# Patient Record
Sex: Female | Born: 1955
Health system: Southern US, Community
[De-identification: ages and names within clinical notes are randomized; demographics above are authoritative.]

## PROBLEM LIST (undated history)

## (undated) DIAGNOSIS — F329 Major depressive disorder, single episode, unspecified: Secondary | ICD-10-CM

## (undated) DIAGNOSIS — I10 Essential (primary) hypertension: Secondary | ICD-10-CM

## (undated) DIAGNOSIS — F32A Depression, unspecified: Secondary | ICD-10-CM

## (undated) DIAGNOSIS — E7800 Pure hypercholesterolemia, unspecified: Secondary | ICD-10-CM

## (undated) DIAGNOSIS — Z950 Presence of cardiac pacemaker: Secondary | ICD-10-CM

## (undated) DIAGNOSIS — I2699 Other pulmonary embolism without acute cor pulmonale: Secondary | ICD-10-CM

## (undated) DIAGNOSIS — G8929 Other chronic pain: Secondary | ICD-10-CM

## (undated) DIAGNOSIS — M1990 Unspecified osteoarthritis, unspecified site: Secondary | ICD-10-CM

## (undated) DIAGNOSIS — E039 Hypothyroidism, unspecified: Secondary | ICD-10-CM

## (undated) DIAGNOSIS — I509 Heart failure, unspecified: Secondary | ICD-10-CM

## (undated) DIAGNOSIS — I82409 Acute embolism and thrombosis of unspecified deep veins of unspecified lower extremity: Secondary | ICD-10-CM

## (undated) DIAGNOSIS — M545 Low back pain, unspecified: Secondary | ICD-10-CM

## (undated) DIAGNOSIS — I428 Other cardiomyopathies: Secondary | ICD-10-CM

## (undated) DIAGNOSIS — E0500 Thyrotoxicosis with diffuse goiter without thyrotoxic crisis or storm: Secondary | ICD-10-CM

## (undated) DIAGNOSIS — E785 Hyperlipidemia, unspecified: Secondary | ICD-10-CM

## (undated) DIAGNOSIS — I447 Left bundle-branch block, unspecified: Secondary | ICD-10-CM

## (undated) DIAGNOSIS — G43909 Migraine, unspecified, not intractable, without status migrainosus: Secondary | ICD-10-CM

## (undated) DIAGNOSIS — F419 Anxiety disorder, unspecified: Secondary | ICD-10-CM

## (undated) HISTORY — DX: Anxiety disorder, unspecified: F41.9

## (undated) HISTORY — DX: Unspecified osteoarthritis, unspecified site: M19.90

## (undated) HISTORY — DX: Hypothyroidism, unspecified: E03.9

## (undated) HISTORY — DX: Hyperlipidemia, unspecified: E78.5

## (undated) HISTORY — DX: Left bundle-branch block, unspecified: I44.7

## (undated) HISTORY — PX: CARDIAC CATHETERIZATION: SHX172

## (undated) HISTORY — DX: Heart failure, unspecified: I50.9

## (undated) HISTORY — PX: INSERT / REPLACE / REMOVE PACEMAKER: SUR710

## (undated) HISTORY — PX: ABDOMINAL HYSTERECTOMY: SHX81

## (undated) HISTORY — DX: Pure hypercholesterolemia, unspecified: E78.00

## (undated) HISTORY — DX: Depression, unspecified: F32.A

## (undated) HISTORY — DX: Other pulmonary embolism without acute cor pulmonale: I26.99

## (undated) HISTORY — DX: Major depressive disorder, single episode, unspecified: F32.9

## (undated) HISTORY — DX: Essential (primary) hypertension: I10

## (undated) HISTORY — DX: Other cardiomyopathies: I42.8

---
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
## 2000-03-01 ENCOUNTER — Ambulatory Visit (HOSPITAL_COMMUNITY): Admission: RE | Admit: 2000-03-01 | Discharge: 2000-03-01 | Payer: Self-pay | Admitting: Endocrinology

## 2000-03-01 ENCOUNTER — Encounter: Payer: Self-pay | Admitting: Endocrinology

## 2000-03-08 ENCOUNTER — Encounter: Payer: Self-pay | Admitting: *Deleted

## 2000-03-08 ENCOUNTER — Ambulatory Visit (HOSPITAL_COMMUNITY): Admission: RE | Admit: 2000-03-08 | Discharge: 2000-03-08 | Payer: Self-pay | Admitting: *Deleted

## 2004-04-12 ENCOUNTER — Ambulatory Visit (HOSPITAL_COMMUNITY): Admission: RE | Admit: 2004-04-12 | Discharge: 2004-04-12 | Payer: Self-pay | Admitting: Obstetrics & Gynecology

## 2005-06-15 ENCOUNTER — Ambulatory Visit (HOSPITAL_COMMUNITY): Admission: RE | Admit: 2005-06-15 | Discharge: 2005-06-15 | Payer: Self-pay | Admitting: Obstetrics & Gynecology

## 2006-04-25 DIAGNOSIS — I82409 Acute embolism and thrombosis of unspecified deep veins of unspecified lower extremity: Secondary | ICD-10-CM

## 2006-04-25 DIAGNOSIS — I2699 Other pulmonary embolism without acute cor pulmonale: Secondary | ICD-10-CM

## 2006-04-25 HISTORY — DX: Other pulmonary embolism without acute cor pulmonale: I26.99

## 2006-04-25 HISTORY — DX: Acute embolism and thrombosis of unspecified deep veins of unspecified lower extremity: I82.409

## 2006-07-06 ENCOUNTER — Ambulatory Visit (HOSPITAL_COMMUNITY): Admission: RE | Admit: 2006-07-06 | Discharge: 2006-07-06 | Payer: Self-pay | Admitting: Obstetrics & Gynecology

## 2006-07-27 ENCOUNTER — Encounter: Admission: RE | Admit: 2006-07-27 | Discharge: 2006-07-27 | Payer: Self-pay | Admitting: Obstetrics & Gynecology

## 2007-02-12 ENCOUNTER — Encounter: Payer: Self-pay | Admitting: Internal Medicine

## 2007-04-30 ENCOUNTER — Encounter: Payer: Self-pay | Admitting: Internal Medicine

## 2007-10-16 ENCOUNTER — Ambulatory Visit: Payer: Self-pay | Admitting: Internal Medicine

## 2007-10-16 DIAGNOSIS — I10 Essential (primary) hypertension: Secondary | ICD-10-CM | POA: Insufficient documentation

## 2007-10-16 DIAGNOSIS — R519 Headache, unspecified: Secondary | ICD-10-CM | POA: Insufficient documentation

## 2007-10-16 DIAGNOSIS — R51 Headache: Secondary | ICD-10-CM | POA: Insufficient documentation

## 2007-10-16 HISTORY — DX: Essential (primary) hypertension: I10

## 2007-10-29 ENCOUNTER — Encounter: Payer: Self-pay | Admitting: Internal Medicine

## 2007-11-12 ENCOUNTER — Ambulatory Visit: Payer: Self-pay | Admitting: Internal Medicine

## 2007-11-15 ENCOUNTER — Ambulatory Visit: Payer: Self-pay | Admitting: Internal Medicine

## 2007-11-15 ENCOUNTER — Inpatient Hospital Stay (HOSPITAL_COMMUNITY): Admission: EM | Admit: 2007-11-15 | Discharge: 2007-11-23 | Payer: Self-pay | Admitting: Emergency Medicine

## 2007-11-15 ENCOUNTER — Ambulatory Visit: Payer: Self-pay

## 2007-11-15 ENCOUNTER — Ambulatory Visit: Payer: Self-pay | Admitting: Cardiology

## 2007-11-15 ENCOUNTER — Ambulatory Visit: Payer: Self-pay | Admitting: Pulmonary Disease

## 2007-11-16 ENCOUNTER — Telehealth: Payer: Self-pay | Admitting: Internal Medicine

## 2007-11-20 ENCOUNTER — Encounter: Payer: Self-pay | Admitting: Internal Medicine

## 2007-11-21 ENCOUNTER — Encounter: Payer: Self-pay | Admitting: Internal Medicine

## 2007-11-27 ENCOUNTER — Ambulatory Visit: Payer: Self-pay | Admitting: Cardiology

## 2007-12-03 ENCOUNTER — Ambulatory Visit: Payer: Self-pay | Admitting: Cardiology

## 2007-12-03 LAB — CONVERTED CEMR LAB
BUN: 9 mg/dL (ref 6–23)
Chloride: 106 meq/L (ref 96–112)
Free T4: 1.9 ng/dL — ABNORMAL HIGH (ref 0.6–1.6)
GFR calc Af Amer: 113 mL/min
GFR calc non Af Amer: 93 mL/min
Glucose, Bld: 106 mg/dL — ABNORMAL HIGH (ref 70–99)
Sodium: 140 meq/L (ref 135–145)
TSH: 0.1 microintl units/mL — ABNORMAL LOW (ref 0.35–5.50)

## 2007-12-04 ENCOUNTER — Ambulatory Visit: Payer: Self-pay | Admitting: Cardiovascular Disease

## 2007-12-04 ENCOUNTER — Ambulatory Visit: Payer: Self-pay | Admitting: Cardiology

## 2007-12-10 ENCOUNTER — Ambulatory Visit: Payer: Self-pay | Admitting: Cardiology

## 2007-12-17 ENCOUNTER — Ambulatory Visit: Payer: Self-pay | Admitting: Cardiology

## 2007-12-17 ENCOUNTER — Ambulatory Visit: Payer: Self-pay | Admitting: Cardiovascular Disease

## 2007-12-26 ENCOUNTER — Ambulatory Visit: Payer: Self-pay | Admitting: Cardiology

## 2008-01-02 ENCOUNTER — Ambulatory Visit: Payer: Self-pay | Admitting: Cardiology

## 2008-01-02 LAB — CONVERTED CEMR LAB
Creatinine, Ser: 0.8 mg/dL (ref 0.4–1.2)
Potassium: 3.8 meq/L (ref 3.5–5.1)

## 2008-01-11 ENCOUNTER — Ambulatory Visit: Payer: Self-pay | Admitting: Cardiology

## 2008-01-11 ENCOUNTER — Ambulatory Visit: Payer: Self-pay | Admitting: Internal Medicine

## 2008-01-11 LAB — CONVERTED CEMR LAB
ALT: 25 units/L (ref 0–35)
Alkaline Phosphatase: 68 units/L (ref 39–117)
Bilirubin, Direct: 0.1 mg/dL (ref 0.0–0.3)
Cholesterol: 189 mg/dL (ref 0–200)
HDL: 62.9 mg/dL (ref >39.0)
Triglycerides: 77 mg/dL (ref 0–149)
VLDL: 15 mg/dL (ref 0–40)

## 2008-01-18 ENCOUNTER — Ambulatory Visit: Payer: Self-pay | Admitting: Cardiology

## 2008-01-18 ENCOUNTER — Ambulatory Visit: Payer: Self-pay | Admitting: Internal Medicine

## 2008-01-18 LAB — CONVERTED CEMR LAB
Alkaline Phosphatase: 67 units/L (ref 39–117)
BUN: 11 mg/dL (ref 6–23)
CO2: 28 meq/L (ref 19–32)
Cholesterol: 187 mg/dL (ref 0–200)
Glucose, Bld: 93 mg/dL (ref 70–99)
HDL: 66.1 mg/dL (ref >39.0)
Total CHOL/HDL Ratio: 2.8
Triglycerides: 89 mg/dL (ref 0–149)

## 2008-01-30 ENCOUNTER — Ambulatory Visit: Payer: Self-pay | Admitting: Cardiology

## 2008-01-30 LAB — CONVERTED CEMR LAB
CO2: 28 meq/L (ref 19–32)
Calcium: 9.3 mg/dL (ref 8.4–10.5)
Chloride: 102 meq/L (ref 96–112)
GFR calc Af Amer: 96 mL/min
GFR calc non Af Amer: 80 mL/min
Glucose, Bld: 92 mg/dL (ref 70–99)

## 2008-02-04 ENCOUNTER — Ambulatory Visit: Payer: Self-pay | Admitting: Cardiology

## 2008-02-25 ENCOUNTER — Ambulatory Visit: Payer: Self-pay | Admitting: Cardiology

## 2008-03-10 ENCOUNTER — Ambulatory Visit: Payer: Self-pay | Admitting: Cardiology

## 2008-03-31 ENCOUNTER — Ambulatory Visit: Payer: Self-pay | Admitting: Cardiology

## 2008-03-31 ENCOUNTER — Ambulatory Visit: Payer: Self-pay | Admitting: Internal Medicine

## 2008-03-31 LAB — CONVERTED CEMR LAB
ALT: 24 units/L (ref 0–35)
AST: 22 units/L (ref 0–37)
Alkaline Phosphatase: 81 units/L (ref 39–117)
BUN: 19 mg/dL (ref 6–23)
Bilirubin, Direct: 0.1 mg/dL (ref 0.0–0.3)
CO2: 29 meq/L (ref 19–32)
Calcium: 9.9 mg/dL (ref 8.4–10.5)
Cholesterol: 159 mg/dL (ref 0–200)
Creatinine, Ser: 0.9 mg/dL (ref 0.4–1.2)
LDL Cholesterol: 75 mg/dL (ref 0–99)
Sodium: 139 meq/L (ref 135–145)
Total CHOL/HDL Ratio: 2.1
Total Protein: 7.5 g/dL (ref 6.0–8.3)
VLDL: 9 mg/dL (ref 0–40)

## 2008-04-07 ENCOUNTER — Ambulatory Visit: Payer: Self-pay | Admitting: Cardiology

## 2008-04-28 ENCOUNTER — Ambulatory Visit: Payer: Self-pay

## 2008-04-28 ENCOUNTER — Ambulatory Visit: Payer: Self-pay | Admitting: Cardiology

## 2008-04-28 ENCOUNTER — Encounter: Payer: Self-pay | Admitting: Cardiology

## 2008-04-28 LAB — CONVERTED CEMR LAB
BUN: 24 mg/dL — ABNORMAL HIGH (ref 6–23)
Basophils Relative: 0.8 % (ref 0.0–3.0)
Chloride: 102 meq/L (ref 96–112)
Eosinophils Relative: 5.4 % — ABNORMAL HIGH (ref 0.0–5.0)
GFR calc Af Amer: 60 mL/min
GFR calc non Af Amer: 50 mL/min
Glucose, Bld: 106 mg/dL — ABNORMAL HIGH (ref 70–99)
HCT: 39.8 % (ref 36.0–46.0)
Hemoglobin: 13.5 g/dL (ref 12.0–15.0)
Lymphocytes Relative: 31.6 % (ref 12.0–46.0)
MCHC: 34 g/dL (ref 30.0–36.0)
Platelets: 369 10*3/uL (ref 150–400)
Potassium: 4.5 meq/L (ref 3.5–5.1)
RDW: 12.2 % (ref 11.5–14.6)
Sodium: 137 meq/L (ref 135–145)
WBC: 7 10*3/uL (ref 4.5–10.5)

## 2008-05-01 ENCOUNTER — Ambulatory Visit: Payer: Self-pay | Admitting: Cardiovascular Disease

## 2008-05-02 ENCOUNTER — Inpatient Hospital Stay (HOSPITAL_BASED_OUTPATIENT_CLINIC_OR_DEPARTMENT_OTHER): Admission: RE | Admit: 2008-05-02 | Discharge: 2008-05-02 | Payer: Self-pay | Admitting: Cardiology

## 2008-05-02 ENCOUNTER — Ambulatory Visit: Payer: Self-pay | Admitting: Cardiology

## 2008-05-13 ENCOUNTER — Ambulatory Visit: Payer: Self-pay | Admitting: Cardiology

## 2008-09-23 ENCOUNTER — Encounter: Payer: Self-pay | Admitting: *Deleted

## 2008-10-29 ENCOUNTER — Encounter: Payer: Self-pay | Admitting: *Deleted

## 2008-11-12 DIAGNOSIS — I447 Left bundle-branch block, unspecified: Secondary | ICD-10-CM | POA: Insufficient documentation

## 2008-11-12 DIAGNOSIS — I428 Other cardiomyopathies: Secondary | ICD-10-CM

## 2008-11-12 HISTORY — DX: Left bundle-branch block, unspecified: I44.7

## 2008-11-12 HISTORY — DX: Other cardiomyopathies: I42.8

## 2008-11-18 ENCOUNTER — Ambulatory Visit: Payer: Self-pay | Admitting: Cardiology

## 2008-11-18 DIAGNOSIS — E7800 Pure hypercholesterolemia, unspecified: Secondary | ICD-10-CM | POA: Insufficient documentation

## 2008-11-18 HISTORY — DX: Pure hypercholesterolemia, unspecified: E78.00

## 2008-11-25 ENCOUNTER — Ambulatory Visit: Payer: Self-pay

## 2008-11-25 ENCOUNTER — Encounter: Payer: Self-pay | Admitting: Cardiology

## 2008-12-03 ENCOUNTER — Encounter: Payer: Self-pay | Admitting: Cardiology

## 2009-02-02 ENCOUNTER — Telehealth: Payer: Self-pay | Admitting: Cardiology

## 2009-02-03 ENCOUNTER — Ambulatory Visit: Payer: Self-pay | Admitting: Cardiology

## 2009-02-16 ENCOUNTER — Ambulatory Visit: Payer: Self-pay | Admitting: Cardiology

## 2009-02-17 ENCOUNTER — Encounter (INDEPENDENT_AMBULATORY_CARE_PROVIDER_SITE_OTHER): Payer: Self-pay | Admitting: *Deleted

## 2009-02-18 ENCOUNTER — Ambulatory Visit: Payer: Self-pay | Admitting: Cardiology

## 2009-02-18 LAB — CONVERTED CEMR LAB
BUN: 9 mg/dL (ref 6–23)
CO2: 28 meq/L (ref 19–32)
GFR calc non Af Amer: 83.85 mL/min (ref >60)

## 2009-02-24 ENCOUNTER — Ambulatory Visit: Payer: Self-pay | Admitting: Cardiology

## 2009-02-27 LAB — CONVERTED CEMR LAB
ALT: 21 units/L (ref 0–35)
AST: 21 units/L (ref 0–37)
Albumin: 3.9 g/dL (ref 3.5–5.2)
Alkaline Phosphatase: 105 units/L (ref 39–117)
Bilirubin, Direct: 0 mg/dL (ref 0.0–0.3)
VLDL: 11 mg/dL (ref 0.0–40.0)

## 2009-04-25 LAB — HM COLONOSCOPY

## 2009-04-25 LAB — HM MAMMOGRAPHY

## 2009-07-13 ENCOUNTER — Telehealth: Payer: Self-pay | Admitting: Cardiology

## 2009-10-23 ENCOUNTER — Telehealth: Payer: Self-pay | Admitting: Cardiology

## 2009-10-28 ENCOUNTER — Telehealth (INDEPENDENT_AMBULATORY_CARE_PROVIDER_SITE_OTHER): Payer: Self-pay | Admitting: *Deleted

## 2009-11-09 ENCOUNTER — Ambulatory Visit: Payer: Self-pay | Admitting: Cardiology

## 2009-11-10 LAB — CONVERTED CEMR LAB
Chloride: 106 meq/L (ref 96–112)
Creatinine, Ser: 0.6 mg/dL (ref 0.4–1.2)
GFR calc non Af Amer: 128.56 mL/min (ref >60)
Glucose, Bld: 92 mg/dL (ref 70–99)
Potassium: 4.1 meq/L (ref 3.5–5.1)
Sodium: 140 meq/L (ref 135–145)

## 2009-12-07 ENCOUNTER — Ambulatory Visit: Payer: Self-pay | Admitting: Cardiology

## 2010-01-11 ENCOUNTER — Telehealth: Payer: Self-pay | Admitting: Cardiology

## 2010-01-19 ENCOUNTER — Ambulatory Visit: Payer: Self-pay | Admitting: Cardiology

## 2010-01-19 DIAGNOSIS — E785 Hyperlipidemia, unspecified: Secondary | ICD-10-CM | POA: Insufficient documentation

## 2010-01-19 HISTORY — DX: Hyperlipidemia, unspecified: E78.5

## 2010-01-28 ENCOUNTER — Ambulatory Visit (HOSPITAL_COMMUNITY): Admission: RE | Admit: 2010-01-28 | Discharge: 2010-01-28 | Payer: Self-pay | Admitting: Obstetrics & Gynecology

## 2010-02-02 LAB — CONVERTED CEMR LAB
ALT: 18 units/L (ref 0–35)
Albumin: 4.2 g/dL (ref 3.5–5.2)
Alkaline Phosphatase: 89 units/L (ref 39–117)
Cholesterol: 244 mg/dL — ABNORMAL HIGH (ref 0–200)
Direct LDL: 162.2 mg/dL
Total CHOL/HDL Ratio: 4
Total Protein: 7.7 g/dL (ref 6.0–8.3)

## 2010-04-01 ENCOUNTER — Ambulatory Visit: Payer: Self-pay

## 2010-05-16 ENCOUNTER — Encounter: Payer: Self-pay | Admitting: Obstetrics & Gynecology

## 2010-05-23 LAB — CONVERTED CEMR LAB
ALT: 23 units/L (ref 0–35)
Albumin: 3.9 g/dL (ref 3.5–5.2)
Alkaline Phosphatase: 117 units/L (ref 39–117)
BUN: 10 mg/dL (ref 6–23)
Chloride: 100 meq/L (ref 96–112)
Cholesterol: 188 mg/dL (ref 0–200)
GFR calc non Af Amer: 96.07 mL/min (ref >60)
Glucose, Bld: 89 mg/dL (ref 70–99)
HDL: 62.7 mg/dL (ref >39.00)
Sodium: 139 meq/L (ref 135–145)
Total CHOL/HDL Ratio: 3
Triglycerides: 68 mg/dL (ref 0.0–149.0)
VLDL: 13.6 mg/dL (ref 0.0–40.0)

## 2010-05-27 NOTE — Progress Notes (Signed)
Summary: Calling regarding Benicar   Phone Note From Pharmacy Call back at [phone]   Caller: [facility]  [address] [phone]* Summary of Call: Calling regarding Benicar Initial call taken by: Judie Grieve,  October 23, 2009 1:01 PM  Follow-up for Phone Call        Spoke with pharmacy. Pharmacist states the issue was  resolved. no additional assistance was needed. Follow-up by: Ollen Gross, RN, BSN,  October 23, 2009 1:52 PM

## 2010-05-27 NOTE — Progress Notes (Signed)
  Medications Added LOSARTAN POTASSIUM 50 MG TABS (LOSARTAN POTASSIUM) take one tablet once daily       Phone Note Call from Patient   Caller: Patient Summary of Call: Pt returned called regarding request from pharmacy for medication change of Benicar to Losartan.  Dr. Shirlee Latch allowed switch from Benicar to Losartan Potassium 50 mg once daily with BMET in 2 weeks.  When pt returned call she agreed to this and also wants follow up appt to see Dr. Shirlee Latch as she wasnt able to keep last appt.  Will forward to Migdalia Dk to check availability for scheduling lab and follow up.  Medication updated in Med List and sent to pharmacy.  Judithe Modest, CMA/AAMA Initial call taken by: Judithe Modest CMA,  October 28, 2009 11:31 AM  Follow-up for Phone Call        pt is sch for lab work on 7/18 and follow up with Dr Shirlee Latch on 8/15, Migdalia Dk  October 28, 2009 4:23 PM   Additional Follow-up for Phone Call Additional follow up Details #1::        Called and [facility] for patient per her request of appt dates and times.  Asked her to call us back if these times are not convenient.   Additional Follow-up by: Judithe Modest CMA,  October 29, 2009 1:40 PM    New/Updated Medications: LOSARTAN POTASSIUM 50 MG TABS (LOSARTAN POTASSIUM) take one tablet once daily Prescriptions: LOSARTAN POTASSIUM 50 MG TABS (LOSARTAN POTASSIUM) take one tablet once daily  #30 x 3   Entered by:   Judithe Modest CMA   Authorized by:   Marca Ancona, MD   Signed by:   Judithe Modest CMA on 10/28/2009   Method used:   [facility]        [facility]  [address] [phone]* (retail)       [address]       [place], [place]  [address]       Ph: [phone] or [phone]       Fax: [phone]   RxID:   [id number]

## 2010-05-27 NOTE — Progress Notes (Signed)
Summary: pt wants to do labwork   Phone Note Call from Patient Call back at Home Phone [phone]   Caller: Patient Reason for Call: Talk to Nurse, Talk to Doctor Summary of Call: pt thinks it is time for her to have labwork done  Initial call taken by: Omer Jack,  January 11, 2010 1:09 PM  Follow-up for Phone Call        reschedule lipid and LFT Claris Gladden, RN, BSN

## 2010-05-27 NOTE — Progress Notes (Signed)
Summary: patient would like to discuss refill issues with RN  Medications Added SPIRONOLACTONE 25 MG TABS (SPIRONOLACTONE) 1 daily POTASSIUM CHLORIDE CR 10 MEQ CR-CAPS (POTASSIUM CHLORIDE) Take one tablet by mouth daily       Phone Note Call from Patient Call back at Home Phone [phone] Call back at [phone]   Caller: Patient Reason for Call: Talk to Nurse Summary of Call: Patient would like to discuss refill issues with RN Initial call taken by: Burnard Leigh,  July 13, 2009 10:38 AM  Follow-up for Phone Call        talked with pt--she is  requesting refills --I have done them to [facility] for her tonight    New/Updated Medications: SPIRONOLACTONE 25 MG TABS (SPIRONOLACTONE) 1 daily POTASSIUM CHLORIDE CR 10 MEQ CR-CAPS (POTASSIUM CHLORIDE) Take one tablet by mouth daily Prescriptions: SPIRONOLACTONE 25 MG TABS (SPIRONOLACTONE) 1 daily  #30 x 6   Entered by:   Katina Dung, RN, BSN   Authorized by:   Marca Ancona, MD   Signed by:   Katina Dung, RN, BSN on 07/13/2009   Method used:   Electronically to        [facility]. [phone]* (retail)       [address] [place], [place]  [address]       Ph: [phone]       Fax: [phone]   RxID:   [id number] POTASSIUM CHLORIDE CR 10 MEQ CR-CAPS (POTASSIUM CHLORIDE) Take one tablet by mouth daily  #30 x 6   Entered by:   Katina Dung, RN, BSN   Authorized by:   Marca Ancona, MD   Signed by:   Katina Dung, RN, BSN on 07/13/2009   Method used:   Electronically to        [facility]. [phone]* (retail)       [address] [place], [place]  [address]       Ph: [phone]       Fax: [phone]   RxID:   [id number] BENICAR 20 MG TABS (OLMESARTAN MEDOXOMIL) 1/2 tab am  #15 x 6   Entered by:   Katina Dung, RN, BSN   Authorized by:   Marca Ancona, MD   Signed by:   Katina Dung, RN, BSN on 07/13/2009   Method used:   Electronically to        [facility]. [phone]* (retail)       [address] [place], [place]  [address]       Ph: [phone]       Fax: [phone]   RxID:   [id number]

## 2010-05-27 NOTE — Assessment & Plan Note (Signed)
Summary: follow up after labs/mt  Medications Added COREG 6.25 MG TABS (CARVEDILOL) one twice a day SPIRONOLACTONE 25 MG TABS (SPIRONOLACTONE) 1 daily- prn FUROSEMIDE 20 MG TABS (FUROSEMIDE) 1 daily-prn POTASSIUM CHLORIDE CR 10 MEQ CR-CAPS (POTASSIUM CHLORIDE) Take one tablet by mouth daily-prn ARMOUR THYROID 90 MG TABS (THYROID) 1 tab once daily except sat/sun      Allergies Added:   Visit Type:  Follow-up Primary Provider:  Elvina Sidle, MD   History of Present Illness: 55 yo with history of hypothyroidism, nonischemic cardiomyopathy, and PE presents for followup.  Echo in 8/10 showed EF 50-55% with mild to moderate MR.  Patient has started back on Armour thyroid and feels like this is keeping her hypothyroidism under better control.  She has been doing well with no exertional dyspnea or chest pain.  She has lost 7 lbs since last appointment.  She has stopped taking spironolactone and Lasix and is just taking Coreg once a day.  Her first grand-child was just born.   ECG: NSR, LBBB  Labs (10/10): BNP 13, K 3.7, creatinine 0.8 Labs (7/11): K 4.1, creatinine 0.6   Current Medications (verified): 1)  Omeprazole 20 Mg  Tbec (Omeprazole) .... Take One Capsule Once Daily 2)  Clonazepam 0.5 Mg  Tabs (Clonazepam) .... As Needed 3)  Losartan Potassium 50 Mg Tabs (Losartan Potassium) .... Take One Tablet Once Daily 4)  Carvedilol 25 Mg Tabs (Carvedilol) .... 1/2 Tab  As Directed Per Pt 5)  Aspirin 81 Mg  Tabs (Aspirin) .Marland Kitchen.. 1 Tab By Mouth Once Daily 6)  Spironolactone 25 Mg Tabs (Spironolactone) .Marland Kitchen.. 1 Daily- Prn 7)  Furosemide 20 Mg Tabs (Furosemide) .Marland Kitchen.. 1 Daily 8)  Potassium Chloride Cr 10 Meq Cr-Caps (Potassium Chloride) .... Take One Tablet By Mouth Daily-Prn 9)  Armour Thyroid 90 Mg Tabs (Thyroid) .Marland Kitchen.. 1 Tab Once Daily Except Sat/sun  Allergies (verified): 1)  ! Lisinopril  Past History:  Past Medical History: 1. Hypothyroidism and the patient has history of Graves disease.   She is status post radioactive iodine therapy.  She then developed hypothyroidism, and the hypothyroidism has been quite difficult to control.   2. Cardiomyopathy, nonischemic.  The patient had a cardiac MRI done during her hospitalization in July 2009.  This showed moderate LV enlargement and global LV hypokinesis.  There was a septal paradpattern.  EF was calculated to be 27%.  There was mild left atrial enlargement.  The RV was normal in size and function.  There was no ASD or VSD.  There was no evidence for delayed enhancement pattern  in the LV myocardium suggesting that there was no scar from coronary artery disease and no definite evidence for infiltrative cardiomyopathy.  HIV was negative.  She did have an echo done also in the hospital in July 2009 that showed severe mitral regurgitation.  A TEE done following this showed an EF of 25-30% with severe mitral regurgitation.  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  The patient did have a left and right heart catheterization after treatment for congestive heart failure in January 2010.  There was no angiographic coronary disease.  EF had improved to 55-60% with no significant mitral  regurgitation.  The hemodynamics were totally normal on the rightheart catheterization.  The cardiomyopathy, I think, was likely du eto poorly controlled hypothyroidism.  Echo (8/10): EF 50-55%, mild LV dilatation, mild-mod MR, PASP 32 3. PE/DVT.  July 2009.  The patient had been very sedentary prior to  this due to her severe hypothyroidism.  She had  normal antithrombin III.  She was negative for factor V Leiden.  She had normal protein C and protein S function.  Now off coumadin.  4. Hypertension. 5. History of left bundle-branch block. 6. Asthma. 7. Migraines. 8. ACEI cough  Family History: Reviewed history from 11/18/2008 and no changes required. Mother-living; heart attack, HTN, Rheumatism Father-deceased age 41; prostate  cancer Brother-living, parathyroid problem Sister- living; arthiritis Brother- living; premature heart attack x 2; DM-insulin Niece- blood clot in groin  Social History: Reviewed history from 11/18/2008 and no changes required. 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   Vital Signs:  Patient profile:   55 year old female Height:      60 inches Weight:      171 pounds BMI:     33.52 Pulse rate:   84 / minute BP sitting:   120 / 80  (left arm) Cuff size:   large  Vitals Entered By: Burnett Kanaris, CNA (December 07, 2009 4:56 PM)  Physical Exam  General:  Well developed, well nourished, in no acute distress.  Neck:  Neck supple, no JVD. No masses, thyromegaly or abnormal cervical nodes. Lungs:  Clear bilaterally to auscultation and percussion. Heart:  Non-displaced PMI, chest non-tender; regular rate and rhythm, S1, S2 without murmurs, rubs or gallops. Paradoxical splitting of S2.  Carotid upstroke normal, no bruit. Pedals normal pulses. Trace edema. Abdomen:  Bowel sounds positive; abdomen soft and non-tender without masses, organomegaly, or hernias noted. No hepatosplenomegaly. Extremities:  No clubbing or cyanosis. Neurologic:  Alert and oriented x 3. Psych:  Normal affect.   Impression & Recommendations:  Problem # 1:  CARDIOMYOPATHY (ICD-425.4) EF improved to 50-55%.  Cardiomyopathy was nonischemic and may have been due to poorly-controlled hypothyroidism.  Ok to stop spironolactone and Lasix.  Would continue Coreg 6.25 mg two times a day and losartan 50 mg daily.  There really is not good data about whether or not she would be at risk for worsening of LV function if she were to stop ACEI and beta blockade at this point so I would lean towards continuing them as long as she is not having intolerable side effects.   Problem # 2:  PURE HYPERCHOLESTEROLEMIA  (ICD-272.0) Patient is no longer taking her statin.  She does have a strong family history of CAD.  I will check lipids/LFTs.   Problem # 3:  BUNDLE BRANCH BLOCK, LEFT (ICD-426.3) Chronic, likely due to prior cardiomyopathy.   Patient Instructions: 1)  Your physician has recommended you make the following change in your medication:  2)  Take Coreg(carvedilol) 6.25mg  twice a day. 3)  Fasting lab Monday August 24,2011. 4)  Your physician wants you to follow-up in: 1 year with Dr Shirlee Latch.  You will receive a reminder letter in the mail two months in advance. If you don't receive a letter, please call our office to schedule the follow-up appointment. Prescriptions: LOSARTAN POTASSIUM 50 MG TABS (LOSARTAN POTASSIUM) take one tablet once daily  #90 x 3   Entered by:   Katina Dung, RN, BSN   Authorized by:   Marca Ancona, MD   Signed by:   Katina Dung, RN, BSN on 12/07/2009   Method used:   Faxed to ...       [facility] (mail-order)             ,  [place]         Ph: [phone]       Fax: [phone]   RxID:   [id number] COREG 6.25 MG TABS (CARVEDILOL) one twice a day  #180 x 3   Entered by:   Katina Dung, RN, BSN   Authorized by:   Marca Ancona, MD   Signed by:   Katina Dung, RN, BSN on 12/07/2009   Method used:   Faxed to ...       [facility] (mail-order)             , [place]         Ph: [phone]       Fax: [phone]   RxID:   [id number]

## 2010-08-09 LAB — POCT I-STAT 3, VENOUS BLOOD GAS (G3P V)
Acid-Base Excess: 1 mmol/L (ref 0.0–2.0)
Acid-base deficit: 1 mmol/L (ref 0.0–2.0)
Bicarbonate: 23.9 mEq/L (ref 20.0–24.0)
Bicarbonate: 25 mEq/L — ABNORMAL HIGH (ref 20.0–24.0)
O2 Saturation: 70 %
O2 Saturation: 72 %
TCO2: 26 mmol/L (ref 0–100)
pCO2, Ven: 37.5 mmHg — ABNORMAL LOW (ref 45.0–50.0)
pH, Ven: 7.402 — ABNORMAL HIGH (ref 7.250–7.300)
pH, Ven: 7.408 — ABNORMAL HIGH (ref 7.250–7.300)
pH, Ven: 7.432 — ABNORMAL HIGH (ref 7.250–7.300)
pO2, Ven: 36 mmHg (ref 30.0–45.0)

## 2010-08-09 LAB — POCT I-STAT 3, ART BLOOD GAS (G3+)
Acid-base deficit: 1 mmol/L (ref 0.0–2.0)
O2 Saturation: 98 %
TCO2: 23 mmol/L (ref 0–100)

## 2010-09-07 NOTE — Assessment & Plan Note (Signed)
[facility]                            CARDIOLOGY OFFICE NOTE   CALIAH, KOPKE                          MRN:          [id number]  DATE:01/18/2008                            DOB:          04/07/1956    PRIMARY CARE PHYSICIAN:  Elvina Sidle, MD at [facility]   She was followed by Endocrinology, by Dr. Reather Littler.   HISTORY OF PRESENT ILLNESS:  This is a 55 year old with history of  PE/DVT, cardiomyopathy of uncertain etiology, and hypothyroidism who  presents to Cardiology Clinic for reassessment of her congestive heart  failure.  Since the last visit, the patient has actually been doing  quite well.  We have been titrating up her cardiac meds.  Her BNP has  fallen from 1115 to 26 when last checked.  She states that she does not  have orthopnea anymore.  She has not had PND.  She is able to walk up  steps now without being short of breath.  She can walk as far as she  wants on flat ground and she has not had any episodes of chest pain.  She has been seeing Dr. Lucianne Muss for adjustment of her thyroid medication.   PAST MEDICAL HISTORY:  1. Hypothyroidism.  The patient has a history of Graves disease.  She      is status post radioactive iodine therapy.  She then developed      hypothyroidism and the hypothyroidism has been quite difficult to      control.  She is now following up with Dr. Reather Littler and has been      begun back on Levoxyl, which she is taking 100 mcg daily.  2. Cardiomyopathy.  The patient had a cardiac MRI done in the      hospital, this showed moderate LV enlargement and global LV      hypokinesis.  There was septal paradox pattern.  EF was calculated      to be 27%.  There was mild left atrial enlargement.  The RV was      normal in size and function.  There was no ASD or VSD.  There was      no evidence for a delayed enhancement pattern in the LV myocardium      suggesting that there is no scar from coronary artery  disease and      no definite evidence for infiltrative cardiomyopathy.  HIV test was      negative in the hospital.  BNP was 1115 in the hospital has now      decreased to 26.  She did have an echocardiogram as well, which      noted severe mitral regurgitation and TE following this showing an      EF of 25-30% with severe mitral regurgitation that was likely due      to mitral annular dilatation.  3. 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  The patient had been      sedentary due to her hypothyroidism.  She did have normal      antithrombin  III and she was negative for factor V Leiden.  She had      normal protein C and protein S function.  4. Hypertension.  5. History of left bundle-branch block.  6. History of asthma.  7. History of migraines.  8. Cough with ACE inhibitor.  9. 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   CURRENT MEDICATIONS:  1. She is on Coreg 12.5 mg b.i.d.  2. Levoxyl 100 mcg daily.  3. Lasix 40 mg daily.  4. K-Dur 20 mEq daily.  5. Imdur 30 mg daily.  6. Zocor 20 mg daily.  7. Spironolactone 25 mg daily.  8. Coumadin as directed.  9. Olmesartan 10 mg daily.  10.Advair 500/50.  11.Hydralazine 25 mg t.i.d.   ALLERGIES:  She does get a cough with ACE INHIBITOR.   SOCIAL HISTORY:  The patient denies smoking.  She used to many years ago  when she was teenager.  Now, she does not drink alcohol and she was not  a heavy drinker in the past.  She has not used any recreational drugs  for the last 20 years.  She lives with her husband, has 2 adult  children.  She works as a Teacher, adult education.   PHYSICAL EXAMINATION:  VITAL SIGNS:  Blood pressure is 108/73, heart  rate is 82 and regular, and weight is 152 pounds.  This is stable since  last appointment when she was 151 pounds.  GENERAL:  No apparent distress.  She is a well-developed female.  NEUROLOGICAL:  Alert and oriented x3.  Normal affect.  NECK:  No JVD.   Thyroid does not appear significantly enlarged.  CARDIOVASCULAR:  Regular S1 and S2.  No S3 and no S4.  There is a 1/6  murmur heard towards the apex, it is softer than it has been in the  past.  There is no peripheral edema.  There are no carotid bruits.  ABDOMEN:  Soft and nontender.  No hepatosplenomegaly.  EXTREMITIES:  No clubbing or cyanosis.   LABORATORY DATA:  Most recent laboratory data from September 2009, BNP  is 26, LDL is 111, HDL 63, potassium 3.8, and creatinine 0.8.   ASSESSMENT AND PLAN:  This is 55 year old with a history of  cardiomyopathy of uncertain etiology, hypothyroidism, pulmonary  embolus/deep venous thrombosis who presents to Cardiology Clinic for  followup.  1. Cardiomyopathy.  The patient's cardiomyopathy is of uncertain      etiology.  She did have a cardiac MRI while in the hospital that      showed no scar at all, suggesting that she probably does not have      coronary artery disease.  If she had coronary artery disease that      was severe enough to cause her degree of cardiomyopathy.  She would      be expected to show some subendocardial pattern scar.      Additionally, there was no evidence on MRI for an infiltrative      disease either, HIV was negative.  She does not have any recent      history of substance abuse or heavy alcohol intake.  She does seem      to have a family history of early coronary artery disease in her      brother.  For further workup of her cardiomyopathy given the early      coronary artery disease in her brother, I think  she probably would      need a left heart catheterization; however, at this time she is on      full anticoagulation with warfarin for deep venous thrombus and PE      in July 2009.  I think after she finishes 6 months anticoagulation      in January, I will hold the warfarin for a left heart      catheterization to assess for any coronary artery disease.  I will      then restart the warfarin after cath  is complete given the      idiopathic pulmonary embolus and dilated cardiomyopathy.  Today,      the patient does appear euvolemic on exam.  This is considerably      improved from prior to discharge.  For her cardiac medications, we      will increase her Coreg today to 18.25 mg twice a day.  We will      continue her on her olmesartan, her hydralazine, her Imdur and her      spironolactone 25 mg daily.  She is on K-Dur also at 20 mEq daily.      She is euvolemic on exam.  We will continue her on the current dose      of Lasix.  Her BNP is actually back within normal range.      Additionally, in January, also we will go ahead and obtain an      echocardiogram to see if she has made some improvement in her      cardiac function, which I do suspect as her MR murmur is much      quieter and her BNP has normalized.  2. Hypothyroidism.  The patient is being treated with Levoxyl and      followed by Dr. Lucianne Muss.  Her heart rate is now no longer      tachycardiac.  3. Hypercholesterolemia.  We are going to increase her Zocor to 40 mg      daily to keep her LDL less than 100 since we do not know if she has      coronary artery disease or not.  4. We will plan on having the patient back for visit in 3 months to      see how she is doing.  We will check her lipids then.  We will also      check a chemistry today now that she is on a full dose of      spironolactone and in January, we will obtain a repeat      echocardiogram and a left heart catheterization.  If her      echocardiogram shows an ejection fraction greater than 35, at this      point she may be able to avoid having an ICD.     Marca Ancona, MD  Electronically Signed    DM/MedQ  DD: 01/18/2008  DT: 01/18/2008  Job #: [id number]   cc:   Pricilla Riffle, MD, [facility]  Elvina Sidle, M.D.  Reather Littler, M.D.

## 2010-09-07 NOTE — Assessment & Plan Note (Signed)
[facility]                            CARDIOLOGY OFFICE NOTE   PANHIA, KARL                          MRN:          [id number]  DATE:05/13/2008                            DOB:          08-18-1955    PRIMARY CARE PHYSICIAN:  Elvina Sidle, M.D. at [facility].   ENDOCRINOLOGIST:  Reather Littler, M.D.   HISTORY OF PRESENT ILLNESS:  This is a 55 year old with history of  PE/DVT, nonischemic cardiomyopathy and hypothyroidism who presents to  Cardiology Clinic for reassessment of her congestive heart failure.  The  patient lately has been doing quite well.  Her thyroid is now under  control.  She did have a right and left heart catheterization in January  2010, showing no angiographic coronary disease.  EF has improved to 55-  60%.  Right and left heart filling pressures were essentially normal.  Cardiac index was normal.  She, as mentioned, has been doing quite well.  She is back to exercising in the gym several times a week.  She is not  having any dyspnea on exertion any longer.  She was having some  lightheadedness with standing and her blood pressure was running low;  however, we cut back on her cardiac medications after the recent heart  catheterization and she tells me now that her lightheadedness has  resolved.  She has been followed closely for hypothyroidism by Dr.  Lucianne Muss.   PAST MEDICAL HISTORY:  1. Hypothyroidism and the patient has history of Graves disease.  She      is status post radioactive iodine therapy.  She then developed      hypothyroidism, and the hypothyroidism has been quite difficult to      control.  She is now followed by Dr. Reather Littler and has been put      back on Levoxyl as well as liothyronine.  2. Cardiomyopathy, nonischemic.  The patient had a cardiac MRI done      during her hospitalization in July 2009.  This showed moderate LV      enlargement and global LV hypokinesis.  There was a septal paradox  pattern.  EF was calculated to be 27%.  There was mild left atrial      enlargement.  The RV was normal in size and function.  There was no      ASD or VSD.  There was no evidence for delayed enhancement pattern      in the LV myocardium suggesting that there was no scar for coronary      artery disease and no definite evidence for infiltrative      cardiomyopathy.  HIV was negative.  She did have an echo done also      in the hospital in July 2009 that showed severe mitral      regurgitation.  A TEE done following this showed an EF of 25-30%      with severe mitral regurgitation.  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  The patient did have a left  and      right heart catheterization after treatment for congestive heart      failure in January 2010.  There was no angiographic coronary      disease.  EF had improved to 55-60% with no significant mitral      regurgitation.  The hemodynamics were totally normal on the right      heart catheterization.  The cardiomyopathy, I think, was likely due      to poorly controlled hypothyroidism.  3. 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  The patient had been very      sedentary prior to this due to her severe hypothyroidism.  She had      normal antithrombin III.  She was negative for factor V Leiden.      She had normal protein C and protein S function.  She was on      Coumadin for 6 months, and this was stopped prior to the heart      catheterization earlier this month.  4. Hypertension.  5. History of left bundle-branch block.  6. Asthma.  7. Migraines.  8. Cough with ACE inhibitor.  9. 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  This had resolved on her left ventriculogram on      the recent heart catheterization that was done.   CURRENT MEDICATIONS:  1. Olmesartan 10 mg daily.  2. Omeprazole 20 mg daily.  3. Spironolactone 25 mg daily.  4. Coreg 12.5 mg b.i.d.  5.  Zocor 40 mg daily.  6. Levoxyl 125 mcg daily.  7. Liothyronine 5 mcg daily.   ALLERGIES:  The patient gets cough with an ACE INHIBITOR.   SOCIAL HISTORY:  The patient does not smoke.  She used to smoke many  years ago when she was a teenager.  She does not smoke now.  She does  not drink alcohol.  She was not a heavy drinker in the past.  She has  not used any recreational drugs for the last 20 years.  She lives with  her husband, has 2 adult children.  She works as a Teacher, adult education.   PHYSICAL EXAMINATION:  VITAL SIGNS:  Blood pressure is 118/60, heart  rate is 76 and regular, and weight is 157 pounds.  GENERAL:  This is a well-developed female in no apparent distress.  NEUROLOGIC:  Alert and oriented x3.  Normal affect.  NECK:  There is no JVD.  There is no thyromegaly or thyroid nodule.  CARDIOVASCULAR:  Heart is regular.  S1 and S2.  No S3.  No S4.  No  murmur.  EXTREMITIES:  No peripheral edema.  No carotid bruit.  ABDOMEN:  Soft and nontender.  No hepatosplenomegaly.  LUNGS:  Clear to auscultation bilaterally with normal respiratory  effort.   ASSESSMENT AND PLAN:  This is a 55 year old with history of nonischemic  cardiomyopathy, hypothyroidism, and pulmonary embolism/deep venous  thrombosis who presents to Cardiology Clinic for followup.  1. Congestive heart failure/cardiomyopathy.  This actually appears to      have completely resolved.  Most recent evaluation was a left and      right heart catheterization, which showed normal left and right      heart filling pressures and normal left ventricular systolic      function with an ejection fraction of 55-60% on the ventriculogram.      There is no angiographic coronary disease.  The  cause of her      cardiomyopathy, I think, was most likely severe hypothyroidism.      Now that her thyroid is better controlled and she has been treated      with cardiac medications, she has been doing much better.  For now,      I am going  to continue her on her current doses of Coreg,      spironolactone, and olmesartan.  Probably, when I see her next in 6      months, we will cut out the spironolactone.  I would probably      prefer to continue her on her beta-blocker and ARB long term.  She      is not volume overloaded today and is New [place] Heart Association      class I regarding her symptoms.  2. Hypothyroidism.  The patient is being treated with Levoxyl and      liothyronine.  She has been followed by Dr. Lucianne Muss.  She is no      longer tachycardic.  3. Hypercholesterolemia.  Lipids are under good control on Zocor 40.      She will follow up in 6 months with me, and she will have her      lipids done before we see her again.     Marca Ancona, MD  Electronically Signed    DM/MedQ  DD: 05/13/2008  DT: 05/14/2008  Job #: [id number]   cc:   Elvina Sidle, M.D.  Reather Littler, M.D.

## 2010-09-07 NOTE — Cardiovascular Report (Signed)
[name], Stacy NO.:  [id number]   MEDICAL RECORD NO.:  [id number]          PATIENT TYPE:  OIB   LOCATION:  1963                         FACILITY:  [facility]   PHYSICIAN:  Marca Ancona, MD      DATE OF BIRTH:  1955-11-20   DATE OF PROCEDURE:  DATE OF DISCHARGE:                            CARDIAC CATHETERIZATION   PROCEDURES:  1. Left heart catheterization.  2. Right heart catheterization.  3. Coronary angiography.  4. Left ventriculography.   INDICATION:  Cardiomyopathy of uncertain etiology.   PROCEDURE NOTE:  After informed consent was obtained, the right groin  was sterilely prepped and draped.  1% lidocaine was used to locally  anesthetize the right groin area.  The right common femoral artery was  entered using Seldinger technique and a 4-French arterial sheath was  placed.  The right common femoral vein was entered using Seldinger  technique and a 7-French venous sheath was placed.  The right heart  catheterization was carried out using a small venous catheter.  Samples  for oxygen saturation were obtained from the pulmonary artery, the right  atrium, and the femoral artery.  Left heart catheterization was carried  out using preformed catheters.  The left coronary artery was engaged  using the 4-French JL4 catheter.  The right coronary artery was engaged  using the 4-French [facility] catheter and the left ventricle was entered using  the 4-French pigtail catheter.  There were no complications.   FINDINGS:  Hemodynamics:  Mean right atrial pressure is 8 mmHg, RV 33/5,  PA 33/14 with a mean pressure of 21 mmHg, mean pulmonary capillary wedge  pressure 10 mmHg, LV 129/10/19, aorta 119/59, cardiac output 4.3 liters  per minute, cardiac index 2.6, SVR 1715, and PVR 205.  Aortic saturation  98%, mixed venous O2 saturation 71%, and right atrial saturation 72%.   Left ventriculogram:  EF was estimated to be 55% to 60%.  There were no  regional wall motion  abnormalities.   Coronary angiography:  There was no angiographic coronary disease.  The  right coronary artery was nondominant, it was a small vessel.  The left  circumflex artery was a large vessel that supplies to the PDA.   ASSESSMENT:  This is a 55 year old with a history of cardiomyopathy of  uncertain etiology who came for heart catheterization to assess for  coronary disease as the cause for her cardiomyopathy and also to assess  her right and left heart filling pressures.  There is no evidence of  angiographic coronary disease.  The patient's LV systolic function has  actually improved to normal at this point.  Her left and right heart  filling pressures are normal.  There was no step up in oxygen  saturations from her right atrium to her pulmonary artery suggesting no  significant shunt  lesion.  Today, I will have the patient stop her Lasix.  She will also  be able to stop her Coumadin.  We will decrease her Coreg to 12.5 bid as  she has had some trouble with blood pressure running low,  and she  will  follow up in the office in 2 weeks.      Marca Ancona, MD  Electronically Signed     DM/MEDQ  D:  05/02/2008  T:  05/02/2008  Job:  [id number]

## 2010-09-07 NOTE — Assessment & Plan Note (Signed)
[facility]                            CARDIOLOGY OFFICE NOTE   PHILICIA, HEYNE                          MRN:          [id number]  DATE:12/04/2007                            DOB:          [birth year]    PRIMARY CARE PHYSICIAN:  Dr. Milus Glazier at [facility].   HISTORY OF PRESENT ILLNESS:  This is a 55 year old with a history of  PE/DVT and cardiomyopathy and hypothyroidism who presents to Cardiology  Clinic for an initial visit after her hospitalization.  The patient was  recently hospitalized in July with pleuritic chest pain and shortness of  breath, at which time, she was found to have a PE and DVT.  Risk factors  include the fact that she had been sedentary for the last few months due  to severe hypothyroidism.  Her hypothyroidism had been very difficult to  control.  The patient was started on Coumadin and heparin and was soon  therapeutic on those medications.  At the time, the patient was noted to  appear to be volume overloaded on exam.  An echocardiogram showed that  she had an EF of 25-30% and a severe mitral regurgitation.  Cardiac MRI  showed no delayed enhancement pattern and was not suggestive of an  infiltrative disease or actually coronary artery disease.  This may be a  nonischemic cardiomyopathy.  The patient's Armour Thyroid was titrated  up.  She was begun on cardiac meds, and she was continued on warfarin.  By the time of discharge, she was feeling better.  The patient states  since discharge, she feels that her energy level has increased.  Prior  to admission, she had severe orthopnea, often slept in a chair.  She now  describes no orthopnea.  She sleeps on one pillow.  She is not having to  use her oxygen at night.  She is having no episodes of PND.  Her dyspnea  on exertion is considerably decreased.  She does get short of breath  after climbing a flight of stairs, but before the hospitalization, she  could not climb a  flight of stairs.  She is finding easier to do  housework.  She has had no episode of chest pain.   PAST MEDICAL HISTORY:  1. Hypothyroidism.  The patient has a history of Graves disease.  She      is status post radioactive iodine, then developed hypothyroidism,      and hypothyroidism has been quite difficult to control.  She has      been now started on Armour Thyroid, however, and her Armour Thyroid      was titrated up while she was in the hospital. Unfortunately, her      last thyroid check today showed that her TSH was suppressed to 0.1      and her free T4 was elevated at 1.9.  2. Cardiomyopathy.  The patient had a cardiac MRI done while in the      hospital, showed moderate LV enlargement and global LV hypokinesis.      There was a septal paradox  pattern.  EF was calculated to be 27%.      There was mild left atrial enlargement.  The RV was normal in size      and function.  There was no ASD or VSD.  There was no evidence for      delayed enhancement pattern in the LV myocardium suggesting that      there is no scar from coronary artery disease and no definite      evidence for infiltrative cardiomyopathy.  HIV test was negative      while in the hospital.  BNP was 1115 while in the hospital.  She      did have an echocardiogram, which noted severe mitral regurgitation      and a TEE following this, showing EF of 25-30% with severe mitral      regurgitation that was likely due to mitral annular dilatation.  3. 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  The patient had been      sedentary due to her hypothyroidism.  She did have normal      antithrombin 3, and she was negative for factor V Leiden.  She had      normal protein C and protein S function.  4. Hypertension.  5. History of left bundle branch block.  6. History of asthma.  7. History of migraines.  8. Cough with ACE inhibitor.   EKG today, sinus tachycardia at 110 with left bundle branch block and  left atrial  enlargement.   MEDICATIONS:  1. Coreg 6.25 mg b.i.d.  2. Lasix 40 mg daily.  3. K-Dur 20 mEq daily.  4. Hydralazine 25 mg t.i.d.  5. Imdur 30 mg daily.  6. Zocor 20 mg daily.  7. Coumadin as directed.  8. Armour Thyroid 180 mg daily.  9. Omeprazole 40 mg daily.  10.Olmesartan 5 mg daily.  11.Advair 500/50 b.i.d.   ALLERGIES:  She does get a cough with ACE INHIBITOR.   SOCIAL HISTORY:  The patient denies smoking, she used to many years ago  when she was a teenager.  Now, she does not drink alcohol, and she was  not a heavy drinker in the past.  She does not use any recreational  drugs for the last 20 years.  She lives with her husband, has 2 adult  children.  She works as a Teacher, adult education.   FAMILY HISTORY:  The patient's brother had an myocardial infarction at  the age of 103.  He actually does have an ICD now.  Her mother has a  history of congestive heart failure and that was at the age of about 38.   REVIEW OF SYSTEMS:  Negative except as noted in the history of present  illness.  Now, she has had no episodes of syncope or presyncope.  She  has no pain in her legs when she walks.   PHYSICAL EXAMINATION:  VITAL SIGNS:  Blood pressure is 128/80, heart  rate is 108 and regular, and weight is 151.  GENERAL:  In no apparent distress.  This is a well-developed female.  NEURO:  Alert and oriented x3.  Normal affect.  NECK:  No JVD.  Thyroid does not appear significantly enlarged.  CARDIOVASCULAR:  Tachy.  S1 and S2.  No S3.  No S4.  There is a 2/6  holosystolic murmur at the apex.  There is no peripheral edema.  There  is no carotid bruits.  ABDOMEN:  Soft, nontender.  No hepatosplenomegaly.  EXTREMITIES:  No clubbing or cyanosis.  SKIN:  Appears warm and dry.  MUSCULOSKELETAL:  Normal exam.  HEENT:  Normal exam.   LABORATORY DATA:  Labs in August 2009, TSH 0.1, free T4 1.9, creatinine  0.7, potassium 3.6, LDL 146, and HDL 60.   ASSESSMENT AND PLAN:  This is a  56 year old with a history of  cardiomyopathy, hypothyroidism, and pulmonary embolism/deep vein  thrombosis who presents to Cardiology Clinic for followup.  1. Cardiomyopathy.  The patient's cardiomyopathy is of uncertain      etiology.  She did have a cardiac MRI while in the hospital that      showed no scar at all, suggesting that she probably does not have      coronary artery disease.  If she had coronary artery disease that      was severe enough to cause her degree of cardiomyopathy, she would      be expected to show some subendocardial pattern scar.      Additionally, there was no evidence on the MRI for an infiltrative      disease either.  HIV was negative.  She does not have any recent      history of substance abuse or heavy alcohol intake.  She does seem      to have a family history of early coronary artery disease in her      brother.  For further workup of her cardiomyopathy given the early      cardiac disease in her brother, I think she probably would need a      left heart catheterization.  However, at this time, she is on full      anticoagulation with warfarin for a DVT in July 2009.  I think      after she finishes 6 months of anticoagulation, I will  hold the      warfarin and for a left heart catheterization to assess for any      coronary artery disease.  I would restart the warfarin after the      catheterization is completed given the idiopathic PE and the      dilated CMP.  Today, the patient does appear euvolemic on exam.      This is considerably improved from prior to discharge.  For her      cardiac medications, we will increase her Coreg today to 12.5 mg      b.i.d. and her olmesartan to 10 mg daily.  We will continue her on      hydralazine 25 mg t.i.d. and Imdur 30 mg daily.  She also appears      euvolemic on her current dose of Lasix, which we will continue at      40 mg daily.  We will increase her K-Dur to 40 mEq daily.  After      these changes  were made, we will bring her back in 2 weeks for      blood pressure check and the check of her basic metabolic panel,      and we will have her back in the office in 6 weeks or so before,      and we will check her lipids and her BNP.  We will check an SPEP      and UPEP.  At next visit, we will plan on starting spironolactone.  2. Hypothyroidism.  The patient is being treated with Armour Thyroid      for hypothyroidism, which now has  converted her to hyperthyroidism.      I think we are dosing her too high on her Armour Thyroid.  I am      going to back off to 120 mg of Armour Thyroid daily, down from 180      mg daily.  She is tachycardic today, and I think the pharmacologic      hyperthyroidism is putting strain on her heart and is probably      having adverse effect on her cardiomyopathy.  She is on beta-      blocker, which we will increase today.  I did tell her that she may      not feel quite as good as she does now.  Once we decrease her      Armour Thyroid, she is following up with Endocrinology towards the      end of this month.  I will let them further adjust her meds as this      has been very difficult problem for her in the past.  3. Pulmonary embolism/deep vein thrombosis.  The patient is on      Coumadin anticoagulation.  She is to follow here in the      Anticoagulation Clinic.  We will continue following her here.  She      will need 6 months anticoagulation before I can catheterize her.      After I catheterize her, I will probably put her back on Coumadin      again, most likely permanently given the history of idiopathic      pulmonary embolism/deep vein thrombosis plus her dilated      cardiomyopathy.  Her pulmonary embolism/deep vein thrombosis is      probably due to her being sedentary in the setting of the      significant hypothyroidism.  Workup has been negative for secondary      causes for.  We will check prothrombin gene mutation on her.  4.  Hypercholesterolemia.  We will check a fasting lipid panel on the      patient's Zocor 20 mg daily before the next appointment.  5. I did talk to the patient at length about the possibility of      needing an ICD in the future.  I would like to get her thyroid      under control and get her on a good medication regimen.  Then,      repeat her echocardiogram before committing her to an ICD.  We will      repeat an echocardiogram probably in 4 to 6 months once her thyroid      is under better control.     Marca Ancona, MD  Electronically Signed    DM/MedQ  DD: 12/04/2007  DT: 12/05/2007  Job #: [id number]   cc:   Elvina Sidle, MD  Noralyn Pick. Eden Emms, MD, [facility]

## 2010-09-07 NOTE — Discharge Summary (Signed)
Stacy Mcintosh, Stacy Mcintosh NO.:  [id number]   MEDICAL RECORD NO.:  [id number]          PATIENT TYPE:  INP   LOCATION:  3712                         FACILITY:  [facility]   PHYSICIAN:  Marca Ancona, MD      DATE OF BIRTH:  [birth year]   DATE OF ADMISSION:  11/15/2007  DATE OF DISCHARGE:  11/23/2007                               DISCHARGE SUMMARY   PROCEDURES:  1. Transesophageal echocardiogram.  2. Direct current cardioversion.  3. CT angiogram of the chest.  4. Lower extremity Dopplers.   PRIMARY AND FINAL DISCHARGE DIAGNOSES:  1. Left lower extremity deep vein thrombosis with mobile clot and      pulmonary embolus.   SECONDARY DIAGNOSES:  1. Dilated cardiomyopathy with an ejection fraction of 25-30% by      transesophageal echocardiogram, mitral annular dilatation causing      severe central mitral regurgitation.  2. Hypothyroidism.  3. Left bundle-branch block.  4. Hypertension.  5. Asthma.  6. Migraines.  7. History of Graves disease, status post radioactive iodine.  8. Hypothyroidism: difficult to control, now on Armour thyroid.  9. Acute systolic congestive heart failure.  10.Status post cardiac magnetic resonance imaging at this admission      showing no delayed enhancement.  11.Allergy or intolerance to Lisinopril   Time at discharge, 43 minutes.   HOSPITAL COURSE:  Stacy Mcintosh is a 55 year old female with no previous  history of coronary artery disease.  She was seen by Dr. Maple Hudson on November 15, 2007.  He evaluated her and felt that because she had recurrent  bilateral leg edema and cramps in the left calf, she possibly had DVT.  She was admitted to the hospital for further evaluation.   Lower extremity Doppler showed DVT with a mobile clot.  CT angiogram of  the chest showed PE.  She was started on heparin and transitioned to  Coumadin.  At discharge, her INR was 2.4.  She will be followed by the  [facility] and was discharged on Coumadin as  directed by  the pharmacy staff at the hospital.  A TSH was performed as well as HIV  testing (which was negative).  She had a TSH that was elevated at  11.607.  She had been on Armour Thyroid prior to admission and this dose  was not changed.  However, it was felt that she would benefit from an  endocrine evaluation, and she is to followup with Dr. Lucianne Muss as an  outpatient.  She had hypercoagulable labs including protein C, which was  60 and was low; however, protein C functional status was within normal  limits at 122.  Protein S, total and functional were within normal  limits, and factor V Leiden was negative.  Stools were tested for fecal-  occult blood and were negative.  Antithrombin 3 was within normal limits  at 91.  D-dimer had been checked prior to the CT angiogram and was  elevated at 10.76.   She was noted to have cardiomegaly and a TEE was performed, which showed  left ventricular dysfunction  with an EF of 25%.  There was severe  central MR likely due to mitral annular dilatation.  Regional wall-  motion abnormalities were concerning for ischemic heart disease, and a  cardiac MRI with contrast was performed.  The MRI showed no significant  scar pattern.  It was therefore felt that there was no evidence for  coronary artery disease or infiltrative disease by MRI.  HIV was  negative.  Hypothyroidism may be contributing to the cardiomyopathy, and  this will be further evaluated as an outpatient.  Stacy Mcintosh had some  problems with volume overload and was diuresed with IV Lasix before  being converted to p.o.  She had some hypokalemia and this was  supplemented.  She was unable to undergo LHC to definitively rule out  CAD given therapeutic anticoagulation for PE.  However, there was no  evidence for CAD-type scar on the MRI.  At this time, her cardiomyopathy  seems most likely to be nonishemic.  She has severe, probably functional  MR.  Hopefully with optimal medical treatment of  CHF, this will improve.  She will need close followup.   On November 23, 2007, Stacy Mcintosh felt that her cough was now minimal and her  ambulatory abilities were increasing.  Her O2 saturation was 94% on room  air.  Her INR was therapeutic and no more heparin was needed.  Dr.  Shirlee Latch felt that Stacy Mcintosh was stable for discharge with close  outpatient followup.   DISCHARGE INSTRUCTIONS:  Her activity levels to be increased gradually.  She is to stick to a 2-g sodium, fat-modified diet.  She is to weigh  herself daily and call [facility] for 5-pound gain.  She is to follow up  with the [facility] on Tuesday, August, 4, 2009.  She is to follow  up with lab work on December 03, 2007, getting a BMET, TSH, and free T4.  She is to follow up with Dr. Shirlee Latch on December 04, 2007.  She is to  follow up with Dr. Maple Hudson within 30 days.  She is to follow up with Dr.  Milus Glazier at [facility] as well.  She is to follow up with Dr.  Lucianne Muss on December 24, 2007.   DISCHARGE MEDICATIONS:  1. Losartan 25 mg daily.  2. Coreg 6.25 mg b.i.d.  3. Lasix 40 mg a daily.  4. K-Dur 20 mEq a day.  5. Hydralazine 25 mg q.8h.  6. Imdur 30 mg a day.  7. Simvastatin 20 mg a day (lipid profile this admission showing total      cholesterol of 220, triglycerides 71, HDL 60, LDL 146).  8. Coumadin 5 mg, one-half tab daily except for 1 tablet on Saturday      and Sunday.  9. Armour Thyroid 60 mg 3 tabs daily.  10.Omeprazole 20 mg 2 tabs daily.  11.Albuterol p.r.n.  12.Oxygen p.r.n.  13.Clonazepam 0.5 mg p.r.n.  14.Triamterene/HCTZ and Benicar/HCTZ are discontinued.      Theodore Demark, PA-C      Marca Ancona, MD  Electronically Signed    RB/MEDQ  D:  11/23/2007  T:  11/24/2007  Job:  [id number]   cc:   Joni Fears D. Maple Hudson, MD, FCCP, FACP  Elvina Sidle, M.D.

## 2010-09-07 NOTE — Assessment & Plan Note (Signed)
[facility]                            CARDIOLOGY OFFICE NOTE   JOI, LEYVA                          MRN:          [id number]  DATE:04/07/2008                            DOB:          Sep 22, 1955    PRIMARY CARE PHYSICIAN:  Elvina Sidle, M.D. at [facility].   She is followed by Endocrinology, Dr. Reather Littler.   HISTORY OF PRESENT ILLNESS:  This is a 55 year old with history of  PE/DVT, cardiomyopathy of uncertain etiology, and hypothyroidism who  presents to Cardiology Clinic for reassessment of her congestive heart  failure.  Since last visit, the patient has been doing quite well from a  cardiopulmonary standpoint.  She is not getting short of breath with  exercise.  She has been walking for exercise.  She actually went [place]  [place] the [place] and was able to go up and down hills without  significant shortness of breath.  She says she is a little slower than  she had been in the past, but she says she is doing quite well.  She has  had no episodes of chest pain.  She is able to climb steps without  difficulty.  Her main concern is continued fatigue and tiredness, and  she has attributed this to her hypothyroidism.  She is being followed  closely for hypothyroidism by Dr. Lucianne Muss who actually started her on  liothyronine recently.   The patient does report some mild lightheadedness with standing and she  says her blood pressures have been running low on her home cuff,  sometimes as low as systolics in the 80s.   PAST MEDICAL HISTORY:  1. Hypothyroidism.  The patient has history of Graves disease.  She is      status post radioactive iodine therapy.  She then developed      hypothyroidism and hypothyroidism has been quite difficult to      control.  She is now following up with Dr. Reather Littler and has been      put back on Levoxyl as well as liothyronine.  2. Cardiomyopathy, the patient had a cardiac MRI done in her  hospitalization in July 2009.  This showed moderate LV enlargement      and global LV hypokinesis.  There was septal paradox pattern.  EF      was calculated to be 27%.  There was mild left atrial enlargement.      The RV was of normal in size and function.  There was no ASD or      VSD.  There was no evidence for delayed enhancement pattern in the      LV myocardium suggesting there was no scar from coronary artery      disease and no definite evidence for infiltrative cardiomyopathy.      HIV test was negative.  BNP was 115 in July 2009 and decreased to      26 by September 2009.  She had an echocardiogram done in the      hospital in July 2009 as well that showed severe mitral  regurgitation.  A TEE following this echo showed an EF of 25-30%      with severe mitral regurgitation.  It was likely due to mitral      annular dilatation.  3. PE/DVT, this occurred in July 2009.  The patient had been sedentary      prior to this due to her hypothyroidism.  She did have a normal      antithrombin III and she was negative for factor V Leiden.  She had      normal protein C and protein S function.  4. Hypertension.  5. History of left bundle-branch block.  6. History of asthma.  7. History of migraines.  8. Cough with ACE inhibitor.  9. 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   MEDICATIONS:  1. Coreg 18.25 mg b.i.d.  2. Spironolactone 25 mg daily.  3. Olmesartan 10 mg daily.  4. Zocor 40 mg daily.  5. Levoxyl 125 mcg daily.  6. Lasix 40 mg daily.  7. Hydralazine 25 mg t.i.d.  8. Imdur 30 mg daily.  9. Coumadin.  10.Omeprazole 20 mg daily.  11.Advair 500/50 daily.  12.Potassium chloride 20 mEq daily.  13.Liothyronine 5 mg daily.   EKG today shows normal sinus rhythm at 85 with left bundle-branch block.   ALLERGIES:  She gets cough with ACE INHIBITOR.   SOCIAL HISTORY:  The patient denies smoking.  She used to smoke many  years  ago when she was a teenager, now she does not drink alcohol and  she was not a heavy drinker in the past.  She did not use any  recreational drugs for the last 20 years.  She lives with her husband,  has two adult children.  She works as a Teacher, adult education.   FAMILY HISTORY:  The patient has a family history of early coronary  artery disease in her brother.   LABORATORY DATA:  Most recent labs from December 2009, LFTs are normal,  creatinine is 0.9, potassium 3.8, HDL 75, LDL 75, triglycerides 47.   PHYSICAL EXAMINATION:  VITAL SIGNS:  Blood pressure is 90/60, weight is  159 pounds, heart rate is 85 and regular.  GENERAL:  This is a well-developed female in no apparent distress.  NEUROLOGIC:  Alert and oriented x3.  Normal affect.  NECK:  There is no JVD.  The thyroid does not appear significantly  enlarged.  CARDIOVASCULAR:  Heart regular.  S1, S2.  No S3, no S4.  There was a 1/6  murmur heard towards the apex, it was softer than it has been in the  past.  There is no peripheral edema.  There are no carotid bruits.  ABDOMEN:  Soft, nontender.  No hepatosplenomegaly.  EXTREMITIES:  No clubbing or cyanosis.  LUNGS:  Clear to auscultation bilaterally.  Normal respiratory effort.  HEENT:  Normal exam.   ASSESSMENT AND PLAN:  This is a 55 year old with a history of  cardiomyopathy of uncertain etiology, hypothyroidism, and pulmonary  embolism/deep venous thrombosis who presents to Cardiology Clinic for  followup.  1. Cardiomyopathy.  The patient's cardiomyopathy is of uncertain      etiology.  She did have cardiac MRI while in the hospital showing      no delayed enhancement which did not suggest that the      cardiomyopathy is due to coronary artery disease or an infiltrative      cardiomyopathy.  HIV was negative.  She does not have any recent      history of substance abuse or heavy alcohol intake.  She does seem      to have a family history of early coronary artery disease in  her      brother.  For further workup of her cardiomyopathy given the early      coronary artery disease in her brother, I think she does need a      left heart catheterization.  In January, she will have been on      Coumadin for 6 months for her pulmonary embolism.  I think it will      be reasonable to hold her Coumadin in January and do a left heart      catheterization to rule out coronary artery disease.  We will also      repeat an echocardiogram as well.  If her left ventricular function      is still depressed, I think I will leave her on warfarin.  If her      left ventricular function has returned to normal or only mildly      decreased, I think I will let her stay off of warfarin.  For her      cardiac medication, I am going to increase her Coreg today to 25 mg      twice a day.  However, given her relatively low blood pressure and      her slight lightheadedness with standing at home, I am going to      have her stop the Hydralazine and the isosorbide.  She will      continue on her olmesartan 10 mg daily and her spironolactone 25 mg      daily.  She is euvolemic on exam and we are going to continue her      on her current dose of Lasix.  2. Hypothyroidism.  The patient is being treated with Levoxyl and      liothyronine, and being followed by Dr. Lucianne Muss.  Her heart rate is      no longer tachycardiac.  3. Hypercholesterolemia.  The patient's lipids are under good control      on Zocor 40 mg daily.  4. We are going to have the patient back in 6 months and for her      office visit, we will also do a left heart catheterization in      January.  Based on the heart catheterization and the echo, we will      decide on whether to continue her warfarin past January.     Marca Ancona, MD  Electronically Signed    DM/MedQ  DD: 04/07/2008  DT: 04/07/2008  Job #: [id number]   cc:   Reather Littler, M.D.  Elvina Sidle, M.D.

## 2010-10-11 ENCOUNTER — Telehealth: Payer: Self-pay | Admitting: Cardiology

## 2010-10-11 DIAGNOSIS — E785 Hyperlipidemia, unspecified: Secondary | ICD-10-CM

## 2010-10-11 DIAGNOSIS — I428 Other cardiomyopathies: Secondary | ICD-10-CM

## 2010-10-11 NOTE — Telephone Encounter (Signed)
Pt wants to know should she have lab work drawn prior to appt in aug.

## 2010-10-12 NOTE — Telephone Encounter (Signed)
Pt needs lipid/liver /bmp. I discussed with pt. She will come for fasting lab 11/22/10 prior to appt with Dr Shirlee Latch 11/25/10.

## 2010-10-28 ENCOUNTER — Encounter: Payer: Self-pay | Admitting: Endocrinology

## 2010-10-28 ENCOUNTER — Other Ambulatory Visit: Payer: Self-pay | Admitting: Cardiology

## 2010-10-28 ENCOUNTER — Other Ambulatory Visit (INDEPENDENT_AMBULATORY_CARE_PROVIDER_SITE_OTHER): Payer: 59

## 2010-10-28 ENCOUNTER — Ambulatory Visit (INDEPENDENT_AMBULATORY_CARE_PROVIDER_SITE_OTHER): Payer: 59 | Admitting: Endocrinology

## 2010-10-28 VITALS — BP 130/86 | HR 79 | Temp 97.9°F | Ht 60.0 in | Wt 174.2 lb

## 2010-10-28 DIAGNOSIS — I428 Other cardiomyopathies: Secondary | ICD-10-CM

## 2010-10-28 DIAGNOSIS — E785 Hyperlipidemia, unspecified: Secondary | ICD-10-CM

## 2010-10-28 DIAGNOSIS — E039 Hypothyroidism, unspecified: Secondary | ICD-10-CM | POA: Insufficient documentation

## 2010-10-28 DIAGNOSIS — E890 Postprocedural hypothyroidism: Secondary | ICD-10-CM

## 2010-10-28 LAB — HEPATIC FUNCTION PANEL
AST: 18 U/L (ref 0–37)
Alkaline Phosphatase: 72 U/L (ref 39–117)
Bilirubin, Direct: 0 mg/dL (ref 0.0–0.3)

## 2010-10-28 LAB — LIPID PANEL
Total CHOL/HDL Ratio: 3
VLDL: 27.6 mg/dL (ref 0.0–40.0)

## 2010-10-28 LAB — BASIC METABOLIC PANEL
BUN: 10 mg/dL (ref 6–23)
CO2: 30 mEq/L (ref 19–32)
Calcium: 9.1 mg/dL (ref 8.4–10.5)
Chloride: 101 mEq/L (ref 96–112)
Creatinine, Ser: 1 mg/dL (ref 0.4–1.2)

## 2010-10-28 MED ORDER — LEVOTHYROXINE SODIUM 150 MCG PO TABS: 150.0000 ug | ORAL_TABLET | Freq: Every day | ORAL | Status: DC

## 2010-10-28 NOTE — Progress Notes (Signed)
Subjective:    Patient ID: Stacy Mcintosh, female    DOB: 04-01-1956, 55 y.o.   MRN: [id number]  HPI In 2001, pt was rx'ed with ptu, then she had i-131.  She has been on thyroid hormone supplementation since then.  She reports several types of thyroid hormone supplements, with different dosages of each.  She report many years of intermittent episodes of moderate palpitations in the chest, and assoc anxiety.  She says her dosage of armour thyroid was reduced 2 months ago, due to an abnormal blood test.  However, she says she feels better on this than on levothyroxine. Past Medical History  Diagnosis Date  . Arthritis   . Depression   . Hx of migraines   . Pure hypercholesterolemia 11/18/2008  . CARDIOMYOPATHY 11/12/2008  . HYPERTENSION 10/16/2007  . Other and unspecified hyperlipidemia 01/19/2010  . BUNDLE BRANCH BLOCK, LEFT 11/12/2008  . ASTHMA 10/16/2007    Past Surgical History  Procedure Date  . Abdominal hysterectomy   . Cesarean section     x3    History   Social History  . Marital Status: Married    Spouse Name: N/A    Number of Children: 3  . Years of Education: College   Occupational History  . Massage Therapist    Social History Main Topics  . Smoking status: Never Smoker   . Smokeless tobacco: Not on file  . Alcohol Use: Yes  . Drug Use: No  . Sexually Active: Not on file   Other Topics Concern  . Not on file   Social History Narrative  . No narrative on file  pt reports marital problems.  No current outpatient prescriptions on file prior to visit.    Allergies  Allergen Reactions  . Lisinopril     Family History  Problem Relation Age of Onset  . Heart attack Mother   . Hypertension Mother   . Rheumatologic disease Mother   . Cancer Mother     Ovarian Cancer  . Cancer Father     Prostate Cancer  . Arthritis Sister   . Thyroid disease Brother     Parathyroid problem  . Diabetes Brother   . Heart attack Brother   mother is in poor health  BP  130/86  Pulse 79  Temp(Src) 97.9 F (36.6 C) (Oral)  Ht 5' (1.524 m)  Wt 174 lb 3.2 oz (79.017 kg)  BMI 34.02 kg/m2  SpO2 96%    Review of Systems denies double vision, palpitations, sob, myalgias, excessive diaphoresis, numbness, tremor, hypoglycemia, easy bruising, and rhinorrhea.  She has fatigue hoarseness, diarrhea, urinary frequency, and headaches, and weight gain.     Objective:   Physical Exam VS: see vs page GEN: no distress HEAD: head: no deformity eyes: no periorbital swelling, no proptosis external nose and ears are normal mouth: no lesion seen NECK: supple, thyroid is not enlarged.  No nodule. CHEST WALL: no deformity CV: reg rate and rhythm, no murmur ABD: abdomen is soft, nontender.  no hepatosplenomegaly.  not distended.  no hernia. MUSCULOSKELETAL: muscle bulk and strength are grossly normal.  no obvious joint swelling.  gait is normal and steady EXTEMITIES: no deformity.  no ulcer on the feet.  feet are of normal color and temp.  no edema PULSES: dorsalis pedis intact bilat.  no carotid bruit NEURO:  cn 2-12 grossly intact.   readily moves all 4's.  sensation is intact to touch on the feet SKIN:  Normal texture and temperature.  No  rash or suspicious lesion is visible.   NODES:  None palpable at the neck PSYCH: alert, oriented x3.  Does not appear anxious nor depressed.    Lab Results  Component Value Date   TSH 3.87 10/28/2010   Assessment & Plan:  Hypothyroidism.  Despite this normal tsh, she should take synthroid.  This is because tsh is a long-term indicator of thyroid hormone action on the body.  Therefore, we have no parameter to follow in a patient on armour thyroid. Cardiomyopathy.  This is another reason to avoid armour thyroid. Palpitations.  Because of the above, this could be attributed to the armour thyroid despite the normal tsh.

## 2010-10-28 NOTE — Patient Instructions (Addendum)
blood tests are being ordered for you today.  please call [phone] to hear your test results.  You will be prompted to enter the 9-digit "MRN" number that appears at the top left of this page, followed by #.  Then you will hear the message. (update: i left message on phone-tree:  Change to synthroid 150/d.  Recheck 4-6 weeks).

## 2010-11-01 ENCOUNTER — Telehealth: Payer: Self-pay | Admitting: Cardiology

## 2010-11-01 NOTE — Telephone Encounter (Signed)
LMTCB

## 2010-11-01 NOTE — Telephone Encounter (Signed)
Pt rtn your call

## 2010-11-02 NOTE — Telephone Encounter (Signed)
Pt rtn call-pls call  °

## 2010-11-04 ENCOUNTER — Telehealth: Payer: Self-pay | Admitting: Cardiology

## 2010-11-04 DIAGNOSIS — I428 Other cardiomyopathies: Secondary | ICD-10-CM

## 2010-11-04 DIAGNOSIS — E7800 Pure hypercholesterolemia, unspecified: Secondary | ICD-10-CM

## 2010-11-04 MED ORDER — SPIRONOLACTONE 25 MG PO TABS: 25.0000 mg | ORAL_TABLET | Freq: Every day | ORAL | Status: DC | PRN

## 2010-11-04 MED ORDER — SIMVASTATIN 20 MG PO TABS: 20.0000 mg | ORAL_TABLET | Freq: Every day | ORAL | Status: DC

## 2010-11-04 NOTE — Telephone Encounter (Signed)
Result Notes     Notes Recorded by Jacqlyn Krauss, RN on 11/04/2010 at 3:13 PM I talked with pt. Pt will restart Spironolactone and Simvastatin 20mg  daily. She will return for [facility] 11/15/10. She will return for a fasting lipid/liver 01/04/11. ------  Notes Recorded by Jacqlyn Krauss, RN on 11/04/2010 at 2:11 PM LMTCB ------  Notes Recorded by Jacqlyn Krauss, RN on 11/04/2010 at 9:09 AM LMTCB ------  Notes Recorded by Jacqlyn Krauss, RN on 11/02/2010 at 2:51 PM Pt states she has not been taking Simvastatin 20mg  daily. She also states that she has not been taking Spironolactone. She is requesting a refill of Spironolactone. I reviewed with Dr Shirlee Latch. He recommended pt refill and restart Spironolactone but not start KCL supplement. He recommended repeat BMP in 2 weeks. He recommended pt take Simvastatin 20mg  regularly and return for L/L in 2 months. LMTCB ------  Notes Recorded by Jacqlyn Krauss, RN on 11/01/2010 at 1:32 PM LMTCB ------  Notes Recorded by Jacqlyn Krauss, RN on 11/01/2010 at 12:18 PM LMTCB ------  Notes Recorded by Marca Ancona, MD on 10/29/2010 at 4:21 PM Low k, take KCl 20 mEq daily. LDL is too high given family history of CAD, would increase simvastatin to 40 mg daily and repeat lipids/LFTs in 2 months.

## 2010-11-04 NOTE — Telephone Encounter (Signed)
rtn call back to anne,.

## 2010-11-04 NOTE — Telephone Encounter (Signed)
LMTCB

## 2010-11-04 NOTE — Telephone Encounter (Signed)
I talked with pt about recent lab results 

## 2010-11-04 NOTE — Telephone Encounter (Signed)
Pt returning call from Katina Dung, please return pt call and leave detailed message if pt does not answer.

## 2010-11-15 ENCOUNTER — Other Ambulatory Visit: Payer: 59 | Admitting: *Deleted

## 2010-11-22 ENCOUNTER — Other Ambulatory Visit: Payer: Self-pay | Admitting: *Deleted

## 2010-11-25 ENCOUNTER — Ambulatory Visit: Payer: Self-pay | Admitting: Cardiology

## 2010-11-29 ENCOUNTER — Telehealth: Payer: Self-pay | Admitting: Cardiology

## 2010-12-01 ENCOUNTER — Other Ambulatory Visit: Payer: Self-pay | Admitting: Cardiology

## 2010-12-13 ENCOUNTER — Encounter: Payer: Self-pay | Admitting: Cardiology

## 2010-12-31 ENCOUNTER — Other Ambulatory Visit (HOSPITAL_COMMUNITY): Payer: Self-pay | Admitting: Obstetrics & Gynecology

## 2010-12-31 DIAGNOSIS — Z1231 Encounter for screening mammogram for malignant neoplasm of breast: Secondary | ICD-10-CM

## 2011-01-03 ENCOUNTER — Ambulatory Visit: Payer: Self-pay | Admitting: Cardiology

## 2011-01-04 ENCOUNTER — Other Ambulatory Visit: Payer: Self-pay | Admitting: *Deleted

## 2011-01-21 LAB — URINALYSIS, ROUTINE W REFLEX MICROSCOPIC
Bilirubin Urine: NEGATIVE
Bilirubin Urine: NEGATIVE
Glucose, UA: NEGATIVE
Glucose, UA: NEGATIVE
Ketones, ur: NEGATIVE
Ketones, ur: NEGATIVE
Nitrite: NEGATIVE
Nitrite: NEGATIVE
Protein, ur: NEGATIVE
Specific Gravity, Urine: 1.015
pH: 6.5

## 2011-01-21 LAB — COMPREHENSIVE METABOLIC PANEL
ALT: 38 — ABNORMAL HIGH
AST: 30
AST: 45 — ABNORMAL HIGH
Albumin: 3 — ABNORMAL LOW
Albumin: 3.2 — ABNORMAL LOW
Alkaline Phosphatase: 58
BUN: 4 — ABNORMAL LOW
BUN: 7
CO2: 24
CO2: 28
Calcium: 9.8
Chloride: 101
Chloride: 93 — ABNORMAL LOW
Creatinine, Ser: 0.76
Creatinine, Ser: 0.82
Creatinine, Ser: 0.88
GFR calc Af Amer: 52 — ABNORMAL LOW
GFR calc Af Amer: >60
GFR calc non Af Amer: >60
GFR calc non Af Amer: >60
Glucose, Bld: 98
Potassium: 3.7
Potassium: 4.1
Sodium: 137
Total Bilirubin: 0.6
Total Bilirubin: 0.7
Total Protein: 6.9
Total Protein: 7.2

## 2011-01-21 LAB — CBC
HCT: 38.8
HCT: 39.9
HCT: 40
HCT: 40.8
HCT: 42
Hemoglobin: 12.6
Hemoglobin: 12.9
Hemoglobin: 13.4
Hemoglobin: 13.5
Hemoglobin: 13.5
MCHC: 32.1
MCHC: 32.2
MCHC: 32.5
MCV: 86
MCV: 86.9
MCV: 87.9
MCV: 88.4
MCV: 88.6
Platelets: 448 — ABNORMAL HIGH
Platelets: 579 — ABNORMAL HIGH
Platelets: 584 — ABNORMAL HIGH
Platelets: 598 — ABNORMAL HIGH
Platelets: 630 — ABNORMAL HIGH
Platelets: 667 — ABNORMAL HIGH
RBC: 4.47
RBC: 4.74
RBC: 4.78
RBC: 4.79
RDW: 15.9 — ABNORMAL HIGH
RDW: 16.1 — ABNORMAL HIGH
RDW: 16.4 — ABNORMAL HIGH
RDW: 16.5 — ABNORMAL HIGH
RDW: 17 — ABNORMAL HIGH
RDW: 17.3 — ABNORMAL HIGH
WBC: 6.4
WBC: 7
WBC: 8.2
WBC: 8.4
WBC: 8.5

## 2011-01-21 LAB — DIFFERENTIAL
Basophils Absolute: 0.1
Eosinophils Absolute: 0.3
Eosinophils Relative: 3
Lymphocytes Relative: 33
Lymphs Abs: 3
Monocytes Absolute: 0.5
Monocytes Relative: 7
Neutro Abs: 4
Neutrophils Relative %: 57

## 2011-01-21 LAB — BASIC METABOLIC PANEL
BUN: 6
CO2: 28
Chloride: 103
Chloride: 99
Creatinine, Ser: 0.85
GFR calc Af Amer: >60
Glucose, Bld: 90
Potassium: 3 — ABNORMAL LOW
Potassium: 3.8
Sodium: 136

## 2011-01-21 LAB — URINE MICROSCOPIC-ADD ON

## 2011-01-21 LAB — PROTIME-INR
INR: 1.1
INR: 1.2
INR: 1.8 — ABNORMAL HIGH
INR: 2.4 — ABNORMAL HIGH
INR: 2.8 — ABNORMAL HIGH
INR: 3 — ABNORMAL HIGH
Prothrombin Time: 14.5
Prothrombin Time: 14.6
Prothrombin Time: 15.3 — ABNORMAL HIGH
Prothrombin Time: 27.6 — ABNORMAL HIGH

## 2011-01-21 LAB — HEPARIN LEVEL (UNFRACTIONATED)
Heparin Unfractionated: 0.47
Heparin Unfractionated: 0.48
Heparin Unfractionated: 0.53
Heparin Unfractionated: 0.68

## 2011-01-21 LAB — LIPID PANEL
Cholesterol: 220 — ABNORMAL HIGH
LDL Cholesterol: 146 — ABNORMAL HIGH
Triglycerides: 71

## 2011-01-21 LAB — TSH: TSH: 11.607 — ABNORMAL HIGH

## 2011-01-21 LAB — PROTEIN C ACTIVITY: Protein C Activity: 122 % (ref 75–133)

## 2011-01-21 LAB — D-DIMER, QUANTITATIVE: D-Dimer, Quant: 10.76 — ABNORMAL HIGH

## 2011-01-21 LAB — OCCULT BLOOD X 1 CARD TO LAB, STOOL: Fecal Occult Bld: NEGATIVE

## 2011-01-21 LAB — APTT: aPTT: 184 — ABNORMAL HIGH

## 2011-01-24 ENCOUNTER — Other Ambulatory Visit: Payer: Self-pay | Admitting: Cardiology

## 2011-01-24 ENCOUNTER — Other Ambulatory Visit (INDEPENDENT_AMBULATORY_CARE_PROVIDER_SITE_OTHER): Payer: 59 | Admitting: *Deleted

## 2011-01-24 ENCOUNTER — Ambulatory Visit (INDEPENDENT_AMBULATORY_CARE_PROVIDER_SITE_OTHER): Payer: 59 | Admitting: Cardiology

## 2011-01-24 VITALS — BP 120/76 | HR 87 | Ht 60.0 in | Wt 160.0 lb

## 2011-01-24 DIAGNOSIS — I428 Other cardiomyopathies: Secondary | ICD-10-CM

## 2011-01-24 DIAGNOSIS — I509 Heart failure, unspecified: Secondary | ICD-10-CM

## 2011-01-24 DIAGNOSIS — E890 Postprocedural hypothyroidism: Secondary | ICD-10-CM

## 2011-01-24 DIAGNOSIS — I2699 Other pulmonary embolism without acute cor pulmonale: Secondary | ICD-10-CM

## 2011-01-24 DIAGNOSIS — E7800 Pure hypercholesterolemia, unspecified: Secondary | ICD-10-CM

## 2011-01-24 DIAGNOSIS — I5032 Chronic diastolic (congestive) heart failure: Secondary | ICD-10-CM

## 2011-01-24 LAB — BASIC METABOLIC PANEL
BUN: 9 mg/dL (ref 6–23)
CO2: 28 mEq/L (ref 19–32)
Chloride: 103 mEq/L (ref 96–112)
Creatinine, Ser: 1 mg/dL (ref 0.4–1.2)

## 2011-01-24 LAB — LIPID PANEL
Total CHOL/HDL Ratio: 3
VLDL: 14.4 mg/dL (ref 0.0–40.0)

## 2011-01-24 LAB — HEPATIC FUNCTION PANEL
Alkaline Phosphatase: 77 U/L (ref 39–117)
Bilirubin, Direct: 0 mg/dL (ref 0.0–0.3)

## 2011-01-24 MED ORDER — SIMVASTATIN 20 MG PO TABS: 20.0000 mg | ORAL_TABLET | Freq: Every evening | ORAL | Status: DC

## 2011-01-24 NOTE — Patient Instructions (Signed)
Stop coreg(carvedilol).  Start bisoprolol 5mg  daily.  Start simvastatin (Zocor) 20mg  daily in the evening.  Your physician recommends that you return for a FASTING lipid profile/liver profile/BMET 428.32  Your physician wants you to follow-up in: 1 year with Dr Shirlee Latch.(October 2013). You will receive a reminder letter in the mail two months in advance. If you don't receive a letter, please call our office to schedule the follow-up appointment.

## 2011-01-24 NOTE — Progress Notes (Signed)
PCP: Jeralyn Ruths  55 yo with history of hypothyroidism, nonischemic cardiomyopathy, and PE presents for cardiology followup.  Last echo in 2010 showed recovered LV systolic function.  Hypothyroidism has been controlled by [facility]our thyroid but patient has not yet found an endocrinologist that she wants to follow up with.  From a cardiac standpoint, she has been doing well symptomatically with no exertional dyspnea or chest pain.  Her main problems recently has been low back pain.  She has also been under a lot of stress in her family life.    ECG: NSR, LBBB  Labs (10/10): BNP 13, K 3.7, creatinine 0.8 Labs (7/11): K 4.1, creatinine 0.6 Labs (7/12): LDL 143, HDL 74, K 3.3, creatinine 1.0, TSH normal  Allergies (verified):  1)  ! Lisinopril  Past Medical History: 1. Hypothyroidism and the patient has history of Graves disease.  She is status post radioactive iodine therapy.  She then developed hypothyroidism, and the hypothyroidism has bee difficult to control.   2. Cardiomyopathy, nonischemic.  The patient had a cardiac MRI done during her hospitalization in July 2009.  This showed moderate LV enlargement and global LV hypokinesis.  There was a septal paradpattern.  EF was calculated to be 27%.  There was mild left atrial enlargement.  The RV was normal in size and function.  There was no ASD or VSD.  There was no evidence for delayed enhancement pattern  in the LV myocardium suggesting that there was no scar from coronary artery disease and no definite evidence for infiltrative cardiomyopathy.  HIV was negative.  She did have an echo done also in the hospital in July 2009 that showed severe mitral regurgitation.  A TEE done following this showed an EF of 25-30% with severe mitral regurgitation.  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  The patient did have a left and right heart catheterization after treatment for congestive heart failure in January 2010.  There was no  angiographic coronary disease.  EF had improved to 55-60% with no significant mitral  regurgitation.  The hemodynamics were totally normal on the rightheart catheterization.  The cardiomyopathy, I think, was likely du eto poorly controlled hypothyroidism.  Echo (8/10): EF 50-55%, mild LV dilatation, mild-mod MR, PASP 32 3. PE/DVT.  July 2009.  The patient had been very sedentary prior to this due to her severe hypothyroidism.  She had  normal antithrombin III.  She was negative for factor V Leiden.  She had normal protein C and protein S function.  Now off coumadin.  4. Hypertension. 5. History of left bundle-branch block. 6. Asthma. 7. Migraines. 8. ACEI cough 9. Low back pain: L-spine disease.   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   Current Outpatient Prescriptions  Medication Sig Dispense Refill  . ALPRAZolam (XANAX) 0.5 MG tablet Take 0.5 mg by mouth 3 (three) times daily as needed.        Marland Kitchen aspirin 81 MG tablet Take 81 mg by mouth daily.        . clonazePAM (KLONOPIN) 1 MG tablet Take 1 mg by mouth daily as needed.       . cyclobenzaprine (FLEXERIL) 5 MG tablet Take 5 mg  by mouth as needed.       Marland Kitchen losartan (COZAAR) 50 MG tablet TAKE 1 TABLET DAILY  90 tablet  2  . omeprazole (PRILOSEC) 20 MG capsule Take 20 mg by mouth 2 (two) times daily.        Marland Kitchen spironolactone (ALDACTONE) 25 MG tablet Take 1 tablet (25 mg total) by mouth daily as needed.  30 tablet  3  . thyroid (ARMOUR) 90 MG tablet Take 90 mg by mouth daily. Except sat and sun       . traMADol (ULTRAM) 50 MG tablet Take 50 mg by mouth as needed.       . bisoprolol (ZEBETA)  5 MG tablet Take 1 tablet (5 mg total) by mouth daily.      . simvastatin (ZOCOR) 20 MG tablet Take 1 tablet (20 mg total) by mouth every evening.  30 tablet  3    BP 120/76  Pulse 87  Ht 5' (1.524 m)  Wt 160 lb (72.576 kg)  BMI 31.25 kg/m2 General: NAD, overweight Neck: No JVD, no thyromegaly or thyroid nodule.  Lungs: Clear to auscultation bilaterally with normal respiratory effort. CV: Nondisplaced PMI.  Heart regular S1/S2, no S3/S4, no murmur.  No peripheral edema.  No carotid bruit.  Normal pedal pulses.  Abdomen: Soft, nontender, no hepatosplenomegaly, no distention.  Neurologic: Alert and oriented x 3.  Psych: Normal affect. Extremities: No clubbing or cyanosis.

## 2011-01-25 ENCOUNTER — Encounter: Payer: Self-pay | Admitting: Cardiology

## 2011-01-25 DIAGNOSIS — Z86711 Personal history of pulmonary embolism: Secondary | ICD-10-CM | POA: Insufficient documentation

## 2011-01-25 DIAGNOSIS — I2699 Other pulmonary embolism without acute cor pulmonale: Secondary | ICD-10-CM | POA: Insufficient documentation

## 2011-01-25 NOTE — Assessment & Plan Note (Signed)
LDL was high in 7/12 and I have asked her to restart a statin, simvastatin 20 mg daily.  She has a family history of premature CAD.  She will take the simvastatin and I will check lipids/LFTs once she has been on it for 2 months.

## 2011-01-25 NOTE — Assessment & Plan Note (Signed)
TSH normal on last check.  Emphasized importance of finding an endocrinologist.

## 2011-01-25 NOTE — Assessment & Plan Note (Signed)
Patient had a nonischemic cardiomyopathy that may have been due to uncontrolled hypothyroidism.  EF improved to 50-55% on echo in 2010.  TSH was normal when checked in 7/12.  I will have her continue losartan and spironolactone.  As she has a hard time taking Coreg twice a day, I will stop Coreg and have her take bisoprolol 5 mg daily instead.  No exertional symptoms currently though she is limited by her back.

## 2011-01-25 NOTE — Assessment & Plan Note (Signed)
History of PE.  No longer taking coumadin.  She will continue ASA 81 mg daily.

## 2011-01-31 ENCOUNTER — Ambulatory Visit (HOSPITAL_COMMUNITY)
Admission: RE | Admit: 2011-01-31 | Discharge: 2011-01-31 | Disposition: A | Discharge status: Home / Self Care | Payer: 59 | Source: Ambulatory Visit | Attending: Obstetrics & Gynecology | Admitting: Obstetrics & Gynecology

## 2011-01-31 DIAGNOSIS — Z1231 Encounter for screening mammogram for malignant neoplasm of breast: Secondary | ICD-10-CM | POA: Insufficient documentation

## 2011-03-28 ENCOUNTER — Other Ambulatory Visit: Payer: Self-pay | Admitting: Cardiology

## 2011-03-28 ENCOUNTER — Other Ambulatory Visit (INDEPENDENT_AMBULATORY_CARE_PROVIDER_SITE_OTHER): Payer: 59 | Admitting: *Deleted

## 2011-03-28 DIAGNOSIS — I509 Heart failure, unspecified: Secondary | ICD-10-CM

## 2011-03-28 DIAGNOSIS — I5032 Chronic diastolic (congestive) heart failure: Secondary | ICD-10-CM

## 2011-03-28 LAB — BASIC METABOLIC PANEL
CO2: 29 mEq/L (ref 19–32)
Calcium: 9.1 mg/dL (ref 8.4–10.5)
Chloride: 104 mEq/L (ref 96–112)
Glucose, Bld: 97 mg/dL (ref 70–99)
Sodium: 140 mEq/L (ref 135–145)

## 2011-03-28 LAB — HEPATIC FUNCTION PANEL
ALT: 18 U/L (ref 0–35)
Albumin: 4 g/dL (ref 3.5–5.2)
Alkaline Phosphatase: 68 U/L (ref 39–117)
Total Protein: 7.5 g/dL (ref 6.0–8.3)

## 2011-03-28 LAB — LIPID PANEL: HDL: 84.5 mg/dL (ref >39.00)

## 2011-04-11 ENCOUNTER — Ambulatory Visit (INDEPENDENT_AMBULATORY_CARE_PROVIDER_SITE_OTHER): Payer: 59

## 2011-04-11 DIAGNOSIS — B9789 Other viral agents as the cause of diseases classified elsewhere: Secondary | ICD-10-CM

## 2011-05-26 ENCOUNTER — Other Ambulatory Visit: Payer: Self-pay | Admitting: Family Medicine

## 2011-05-26 MED ORDER — OMEPRAZOLE 20 MG PO CPDR: 20.0000 mg | DELAYED_RELEASE_CAPSULE | Freq: Two times a day (BID) | ORAL | Status: DC

## 2011-06-05 ENCOUNTER — Other Ambulatory Visit: Payer: Self-pay | Admitting: Cardiology

## 2011-08-15 ENCOUNTER — Telehealth: Payer: Self-pay

## 2011-08-15 NOTE — Telephone Encounter (Signed)
Spoke with patient and let her know that she is to rtc before refills can be made. Patient stated that she understood.

## 2011-08-15 NOTE — Telephone Encounter (Signed)
Please advise pt to RTC if refills desired.

## 2011-08-15 NOTE — Telephone Encounter (Signed)
Spoke with patient and let her know that we are unable to write the rx for a year.  Patient states that she does not have a GI doctor.  She would like for [place] to write a one month rx until she can get into see [place].

## 2011-08-15 NOTE — Telephone Encounter (Signed)
Call in rx to [facility]

## 2011-08-15 NOTE — Telephone Encounter (Signed)
If she wishes a year prescription of her omeprazole twice daily her GI specialist might write that for her, we cannot do that.  She is actually due for an OV here so we can determine if she still needs that dose and if any other testing would be appropriate. (ie, her last colonoscopy, endoscopy etc.)

## 2011-08-15 NOTE — Telephone Encounter (Signed)
PT WOULD LIKE TO KNOW IF SHE WOULD GET A YEAR'S SUPPLY OF HER OMEPRAZOLE FROM [facility] SINCE SHE WILL HAVE TO TAKE THEM ON AN ONGOING BASIC PLEASE CALL [phone]    [facility]

## 2011-08-16 ENCOUNTER — Ambulatory Visit (INDEPENDENT_AMBULATORY_CARE_PROVIDER_SITE_OTHER): Payer: 59 | Admitting: Physician Assistant

## 2011-08-16 VITALS — BP 171/78 | HR 82 | Temp 98.0°F | Resp 18 | Ht 59.5 in | Wt 161.4 lb

## 2011-08-16 DIAGNOSIS — R059 Cough, unspecified: Secondary | ICD-10-CM

## 2011-08-16 DIAGNOSIS — R05 Cough: Secondary | ICD-10-CM

## 2011-08-16 MED ORDER — LEVOFLOXACIN 250 MG PO TABS: 250.0000 mg | ORAL_TABLET | Freq: Every day | ORAL | Status: AC

## 2011-08-16 MED ORDER — ALBUTEROL SULFATE HFA 108 (90 BASE) MCG/ACT IN AERS: 2.0000 | INHALATION_SPRAY | RESPIRATORY_TRACT | Status: DC | PRN

## 2011-08-16 MED ORDER — OMEPRAZOLE 20 MG PO CPDR: 20.0000 mg | DELAYED_RELEASE_CAPSULE | Freq: Two times a day (BID) | ORAL | Status: DC

## 2011-08-16 MED ORDER — LEVOFLOXACIN 250 MG PO TABS: 250.0000 mg | ORAL_TABLET | Freq: Every day | ORAL | Status: DC

## 2011-08-16 MED ORDER — ALBUTEROL SULFATE (2.5 MG/3ML) 0.083% IN NEBU
2.5000 mg | INHALATION_SOLUTION | Freq: Once | RESPIRATORY_TRACT | Status: AC
  Administered 2011-08-16: 2.5 mg via RESPIRATORY_TRACT

## 2011-08-16 NOTE — Progress Notes (Signed)
Subjective:    Patient ID: Stacy Mcintosh, female    DOB: 10-12-1955, 56 y.o.   MRN: [id number]  HPI  Stacy Mcintosh comes in today c/o sneezing, runny nose and paroxysmal cough for 1 week.  Using Albuterol with relief and mucous production.  Feels tight in chest and is wheezing. She feels like this is allergy related. She did travel by car to [place] and back prior to onset of symptoms and does have a history of PE in 2009.  She has not had any calf pain or chest pain. No fever or chills or myalgias.  Also requests refill of Omeprazole.  Uses this to alleviate SE of her medications.    Past Medical History  Diagnosis Date  . Arthritis   . Depression   . Hx of migraines   . Pure hypercholesterolemia 11/18/2008  . CARDIOMYOPATHY 11/12/2008  . HYPERTENSION 10/16/2007  . Other and unspecified hyperlipidemia 01/19/2010  . BUNDLE BRANCH BLOCK, LEFT 11/12/2008  . ASTHMA 10/16/2007  . Hypothyroidism     h/o Graves Disease  . PE (pulmonary embolism) 2009    Factor V negative   Current Outpatient Prescriptions on File Prior to Visit  Medication Sig Dispense Refill  . ALPRAZolam (XANAX) 0.5 MG tablet Take 0.5 mg by mouth 3 (three) times daily as needed.        Marland Kitchen aspirin 81 MG tablet Take 81 mg by mouth daily.        . clonazePAM (KLONOPIN) 1 MG tablet Take 1 mg by mouth daily as needed.       Marland Kitchen losartan (COZAAR) 50 MG tablet TAKE 1 TABLET DAILY  90 tablet  2  . omeprazole (PRILOSEC) 20 MG capsule Take 1 capsule (20 mg total) by mouth 2 (two) times daily.  60 capsule  0  . simvastatin (ZOCOR) 20 MG tablet Take 1 tablet (20 mg total) by mouth every evening.  30 tablet  3  . spironolactone (ALDACTONE) 25 MG tablet TAKE 1 TABLET (25 MG TOTAL) BY MOUTH DAILY AS NEEDED.  30 tablet  2  . thyroid (ARMOUR) 90 MG tablet Take 90 mg by mouth daily. Except sat and sun       . bisoprolol (ZEBETA) 5 MG tablet Take 1 tablet (5 mg total) by mouth daily.      . cyclobenzaprine (FLEXERIL) 5 MG tablet Take 5 mg by mouth as  needed.       . traMADol (ULTRAM) 50 MG tablet Take 50 mg by mouth as needed.        Review of Systems  Constitutional: Negative for fever and chills.  HENT: Positive for congestion, rhinorrhea and sneezing. Negative for sore throat.   Respiratory: Positive for cough, chest tightness and wheezing. Negative for shortness of breath.   Cardiovascular: Negative for chest pain, palpitations and leg swelling.  Musculoskeletal: Negative for myalgias.  Neurological: Negative for dizziness and headaches.       Objective:   Physical Exam  Constitutional: Vital signs are normal. She appears well-developed and well-nourished. She does not appear ill.  HENT:  Right Ear: Tympanic membrane normal.  Left Ear: Tympanic membrane normal.  Nose: Mucosal edema and rhinorrhea present.  Mouth/Throat: Oropharynx is clear and moist.  Cardiovascular: Normal rate and regular rhythm.   Pulmonary/Chest: Effort normal. She has no decreased breath sounds. She has wheezes (throughout).    Albuterol Nebulizer treatment x 1 Patient clears on exam post neb with some residual congestion LLL.  PF 290 post neb  Assessment & Plan:  AR Cough Bronchospasm  Pt has albuterol nebules and nebulizer at home and will use q 4hours or albuterol inhaler.  RX for Levaquin given if symptoms worsen.  Pt to call in 48 hours with status.  Concern for PE has decreased as pt improved dramatically after nebulizer treatment.  To ER if SOB, CP, worsening sx. Refill omperazole for 6 months.

## 2011-08-23 ENCOUNTER — Telehealth: Payer: Self-pay

## 2011-08-23 NOTE — Telephone Encounter (Signed)
PT STATES Stacy Mcintosh HAD PUT HER ON ANTIBIOTICS AND A NEBULIZER, DIDN'T KNOW IF SHE WANTED HER TO CONTINUE TAKING THE ALBUTEROL INHALER SINCE SHE IS ABOUT OUT PLEASE CALL [phone]

## 2011-08-23 NOTE — Telephone Encounter (Signed)
LMOM to call back

## 2011-08-23 NOTE — Telephone Encounter (Signed)
Would you like for her to continue using the inhaler and does she have refills on it?

## 2011-08-23 NOTE — Telephone Encounter (Signed)
She can use either her nebulizer or inhaler but not both at one time. I sent in RX for inhaler at visit. Refill it if it didn't go through.

## 2011-08-25 NOTE — Telephone Encounter (Signed)
LMOM to CB. 

## 2011-08-27 ENCOUNTER — Other Ambulatory Visit: Payer: Self-pay | Admitting: Physician Assistant

## 2011-08-27 ENCOUNTER — Other Ambulatory Visit: Payer: Self-pay | Admitting: Cardiology

## 2011-10-29 ENCOUNTER — Other Ambulatory Visit: Payer: Self-pay | Admitting: Cardiology

## 2011-12-13 ENCOUNTER — Encounter: Payer: Self-pay | Admitting: Physician Assistant

## 2011-12-13 DIAGNOSIS — M533 Sacrococcygeal disorders, not elsewhere classified: Secondary | ICD-10-CM

## 2011-12-27 ENCOUNTER — Telehealth: Payer: Self-pay | Admitting: Cardiology

## 2011-12-27 DIAGNOSIS — I5032 Chronic diastolic (congestive) heart failure: Secondary | ICD-10-CM

## 2011-12-27 MED ORDER — BISOPROLOL FUMARATE 5 MG PO TABS: 5.0000 mg | ORAL_TABLET | Freq: Every day | ORAL | Status: DC

## 2011-12-27 MED ORDER — LOSARTAN POTASSIUM 50 MG PO TABS: 50.0000 mg | ORAL_TABLET | Freq: Every day | ORAL | Status: DC

## 2011-12-27 MED ORDER — SIMVASTATIN 20 MG PO TABS: 20.0000 mg | ORAL_TABLET | Freq: Every evening | ORAL | Status: DC

## 2011-12-27 MED ORDER — SPIRONOLACTONE 25 MG PO TABS: 25.0000 mg | ORAL_TABLET | Freq: Every day | ORAL | Status: DC

## 2011-12-27 NOTE — Telephone Encounter (Signed)
Spoke with pt. Medications refilled.

## 2011-12-27 NOTE — Telephone Encounter (Signed)
New Problem: ° ° ° °Patient called in wanting to speak with you about her medications.  Please call back. °

## 2012-01-23 ENCOUNTER — Telehealth: Payer: Self-pay | Admitting: Cardiology

## 2012-01-23 MED ORDER — CARVEDILOL 6.25 MG PO TABS: 6.2500 mg | ORAL_TABLET | Freq: Two times a day (BID) | ORAL | Status: DC

## 2012-01-23 NOTE — Telephone Encounter (Signed)
Pt calling re dr Shirlee Latch changing med and now having problem with it, pls advise

## 2012-01-23 NOTE — Telephone Encounter (Signed)
Pt requesting to change back to coreg instead of bisoprolol. She is having anxiety, headaches, and diarrhea from bisoprolol.

## 2012-01-23 NOTE — Telephone Encounter (Signed)
Pt aware. Prescription sent in for coreg.

## 2012-01-23 NOTE — Telephone Encounter (Signed)
She may go back to prior dose of Carvedilol.

## 2012-02-07 ENCOUNTER — Other Ambulatory Visit (HOSPITAL_COMMUNITY): Payer: Self-pay | Admitting: Obstetrics & Gynecology

## 2012-02-07 DIAGNOSIS — Z1231 Encounter for screening mammogram for malignant neoplasm of breast: Secondary | ICD-10-CM

## 2012-02-27 ENCOUNTER — Ambulatory Visit (HOSPITAL_COMMUNITY)
Admission: RE | Admit: 2012-02-27 | Discharge: 2012-02-27 | Disposition: A | Discharge status: Home / Self Care | Payer: 59 | Source: Ambulatory Visit | Attending: Obstetrics & Gynecology | Admitting: Obstetrics & Gynecology

## 2012-02-27 DIAGNOSIS — Z1231 Encounter for screening mammogram for malignant neoplasm of breast: Secondary | ICD-10-CM

## 2012-04-02 ENCOUNTER — Ambulatory Visit: Payer: 59 | Admitting: Cardiology

## 2012-04-05 ENCOUNTER — Encounter: Payer: Self-pay | Admitting: *Deleted

## 2012-04-09 ENCOUNTER — Encounter: Payer: Self-pay | Admitting: Cardiology

## 2012-04-09 ENCOUNTER — Ambulatory Visit (INDEPENDENT_AMBULATORY_CARE_PROVIDER_SITE_OTHER): Payer: 59 | Admitting: Cardiology

## 2012-04-09 VITALS — BP 124/76 | HR 89 | Ht 59.5 in | Wt 160.0 lb

## 2012-04-09 DIAGNOSIS — I428 Other cardiomyopathies: Secondary | ICD-10-CM

## 2012-04-09 DIAGNOSIS — I2699 Other pulmonary embolism without acute cor pulmonale: Secondary | ICD-10-CM

## 2012-04-09 DIAGNOSIS — E7800 Pure hypercholesterolemia, unspecified: Secondary | ICD-10-CM

## 2012-04-09 NOTE — Patient Instructions (Addendum)
Stop spironolactone.   Your physician recommends that you return for a FASTING lipid profile /BMET.  Your physician wants you to follow-up in: 1 year with Dr Shirlee Latch. (December 2014).  You will receive a reminder letter in the mail two months in advance. If you don't receive a letter, please call our office to schedule the follow-up appointment.

## 2012-04-10 NOTE — Progress Notes (Signed)
Patient ID: Stacy Mcintosh, female   DOB: 1956/04/14, 56 y.o.   MRN: [id number] PCP: Jeralyn Ruths  56 yo with history of hypothyroidism, nonischemic cardiomyopathy, and PE presents for cardiology followup.  Last echo in 2010 showed recovered LV systolic function.  Hypothyroidism has been controlled by [facility] thyroid.  She is now seeing Dr. Lucianne Muss for endocrine management. From a cardiac standpoint, she has been doing well symptomatically with no exertional dyspnea or chest pain.  She is working full time in massage therapy.  She stopped her statin a long time ago. She is working out at [employer] doing aerobics, etc.  Weight is stable compared to last appointment.   ECG: NSR, LBBB  Labs (10/10): BNP 13, K 3.7, creatinine 0.8 Labs (7/11): K 4.1, creatinine 0.6 Labs (7/12): LDL 143, HDL 74, K 3.3, creatinine 1.0, TSH normal Labs (12/12): K 4, creatinine 0.8, HDL 85, LDL 107  Allergies (verified):  1)  ! Lisinopril  Past Medical History: 1. Hypothyroidism and the patient has history of Graves disease.  She is status post radioactive iodine therapy.  She then developed hypothyroidism, and the hypothyroidism has been difficult to control.   2. Cardiomyopathy, nonischemic.  The patient had a cardiac MRI done during her hospitalization in July 2009.  This showed moderate LV enlargement and global LV hypokinesis.  There was a septal paradpattern.  EF was calculated to be 27%.  There was mild left atrial enlargement.  The RV was normal in size and function.  There was no ASD or VSD.  There was no evidence for delayed enhancement pattern  in the LV myocardium suggesting that there was no scar from coronary artery disease and no definite evidence for infiltrative cardiomyopathy.  HIV was negative.  She did have an echo done also in the hospital in July 2009 that showed severe mitral regurgitation.  A TEE done following this showed an EF of 25-30% with severe mitral regurgitation.  It was thought to be likely due to  mitral annular dilatation.  The patient did have a left and right heart catheterization after treatment for congestive heart failure in January 2010.  There was no angiographic coronary disease.  EF had improved to 55-60% with no significant mitral  regurgitation.  The hemodynamics were totally normal on the rightheart catheterization.  The cardiomyopathy, I think, was likely du eto poorly controlled hypothyroidism.  Echo (8/10): EF 50-55%, mild LV dilatation, mild-mod MR, PASP 32 3. PE/DVT.  July 2009.  The patient had been very sedentary prior to this due to her severe hypothyroidism.  She had  normal antithrombin III.  She was negative for factor V Leiden.  She had normal protein C and protein S function.  Now off coumadin.  4. Hypertension. 5. History of left bundle-branch block. 6. Asthma. 7. Migraines. 8. ACEI cough 9. Low back pain: L-spine disease.   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   Current Outpatient Prescriptions  Medication Sig Dispense Refill  . ALPRAZolam (XANAX) 0.5 MG tablet Take 0.5 mg by mouth 3 (three) times daily as needed.        Marland Kitchen aspirin 81 MG tablet Take 81 mg by mouth daily.        Marland Kitchen  carvedilol (COREG) 6.25 MG tablet Take 1 tablet (6.25 mg total) by mouth 2 (two) times daily.  60 tablet  6  . clonazePAM (KLONOPIN) 1 MG tablet Take 1 mg by mouth daily as needed.       . cyclobenzaprine (FLEXERIL) 5 MG tablet Take 5 mg by mouth as needed.       Marland Kitchen losartan (COZAAR) 50 MG tablet Take 1 tablet (50 mg total) by mouth daily.  90 tablet  3  . omeprazole (PRILOSEC) 20 MG capsule Take 1 capsule (20 mg total) by mouth 2  (two) times daily.  60 capsule  5  . thyroid (ARMOUR) 90 MG tablet Take 90 mg by mouth daily. Except sat and sun       . traMADol (ULTRAM) 50 MG tablet Take 50 mg by mouth as needed.         BP 124/76  Pulse 89  Ht 4' 11.5" (1.511 m)  Wt 160 lb (72.576 kg)  BMI 31.78 kg/m2 General: NAD, overweight Neck: No JVD, no thyromegaly or thyroid nodule.  Lungs: Clear to auscultation bilaterally with normal respiratory effort. CV: Nondisplaced PMI.  Heart regular S1/S2, no S3/S4, no murmur.  No peripheral edema.  No carotid bruit.  Normal pedal pulses.  Abdomen: Soft, nontender, no hepatosplenomegaly, no distention.  Neurologic: Alert and oriented x 3.  Psych: Normal affect. Extremities: No clubbing or cyanosis.   Assessment/Plan:  CARDIOMYOPATHY  Patient had a nonischemic cardiomyopathy that may have been due to uncontrolled hypothyroidism. EF improved to 50-55% on echo in 2010.  Hypothyroidism is being managed by an endocrinologist.  I will have her continue losartan and Coreg. I think she can stop spironolactone at this point. Will get BMET.  Hypothyroidism  Follows with endocrinology.  PURE HYPERCHOLESTEROLEMIA Family history of premature CAD.  Good lipids in 12/12 on simvastatin but now off it.  Will check her lipids. PE (pulmonary embolism)   History of PE. No longer taking coumadin. She will continue ASA 81 mg daily.   Marca Ancona 04/10/2012

## 2012-04-16 ENCOUNTER — Other Ambulatory Visit: Payer: 59

## 2012-04-21 ENCOUNTER — Other Ambulatory Visit: Payer: Self-pay | Admitting: Physician Assistant

## 2012-04-23 ENCOUNTER — Other Ambulatory Visit (INDEPENDENT_AMBULATORY_CARE_PROVIDER_SITE_OTHER): Payer: 59

## 2012-04-23 DIAGNOSIS — E7800 Pure hypercholesterolemia, unspecified: Secondary | ICD-10-CM

## 2012-04-23 LAB — BASIC METABOLIC PANEL
CO2: 29 mEq/L (ref 19–32)
Calcium: 9.5 mg/dL (ref 8.4–10.5)
Chloride: 101 mEq/L (ref 96–112)
Glucose, Bld: 107 mg/dL — ABNORMAL HIGH (ref 70–99)
Potassium: 4 mEq/L (ref 3.5–5.1)
Sodium: 138 mEq/L (ref 135–145)

## 2012-04-23 LAB — LDL CHOLESTEROL, DIRECT: Direct LDL: 140.8 mg/dL

## 2012-04-23 LAB — LIPID PANEL
HDL: 87.4 mg/dL (ref >39.00)
Total CHOL/HDL Ratio: 3
Triglycerides: 68 mg/dL (ref 0.0–149.0)
VLDL: 13.6 mg/dL (ref 0.0–40.0)

## 2012-05-31 ENCOUNTER — Encounter: Payer: Self-pay | Admitting: *Deleted

## 2012-06-18 ENCOUNTER — Telehealth: Payer: Self-pay | Admitting: Cardiology

## 2012-06-18 DIAGNOSIS — E7800 Pure hypercholesterolemia, unspecified: Secondary | ICD-10-CM

## 2012-06-18 MED ORDER — SIMVASTATIN 20 MG PO TABS: 20.0000 mg | ORAL_TABLET | Freq: Every day | ORAL | Status: DC

## 2012-06-18 NOTE — Telephone Encounter (Signed)
NEW PROBLEM   Pt stated she received a letter about cholesterol results and was told to call to discuss recommendation but didn't say what kind of recommendations.

## 2012-06-18 NOTE — Telephone Encounter (Signed)
Spoke with pt. Pt will restart simvastatin 20mg  daily. She will return for fasting lipid /liver profile 08/27/12.

## 2012-07-10 ENCOUNTER — Telehealth: Payer: Self-pay | Admitting: Cardiology

## 2012-07-10 NOTE — Telephone Encounter (Signed)
[facility] [phone] ext [phone]  Called to say, patient is enrolled in [facility].  She will be faxing over some information requesting prescription for patient.

## 2012-07-16 ENCOUNTER — Other Ambulatory Visit: Payer: Self-pay | Admitting: Cardiology

## 2012-08-27 ENCOUNTER — Other Ambulatory Visit: Payer: 59

## 2012-10-30 ENCOUNTER — Other Ambulatory Visit: Payer: Self-pay | Admitting: Cardiology

## 2012-11-13 ENCOUNTER — Telehealth: Payer: Self-pay | Admitting: Cardiology

## 2012-11-13 NOTE — Telephone Encounter (Signed)
New Prob  Pt would like to speak to you regarding some test results.

## 2012-11-13 NOTE — Telephone Encounter (Signed)
Spoke with patient.

## 2012-11-26 ENCOUNTER — Ambulatory Visit (INDEPENDENT_AMBULATORY_CARE_PROVIDER_SITE_OTHER): Payer: 59 | Admitting: Physician Assistant

## 2012-11-26 ENCOUNTER — Encounter: Payer: Self-pay | Admitting: Physician Assistant

## 2012-11-26 VITALS — BP 132/82 | HR 76 | Ht 59.0 in | Wt 168.0 lb

## 2012-11-26 DIAGNOSIS — E785 Hyperlipidemia, unspecified: Secondary | ICD-10-CM

## 2012-11-26 DIAGNOSIS — E890 Postprocedural hypothyroidism: Secondary | ICD-10-CM

## 2012-11-26 DIAGNOSIS — R609 Edema, unspecified: Secondary | ICD-10-CM

## 2012-11-26 DIAGNOSIS — I428 Other cardiomyopathies: Secondary | ICD-10-CM

## 2012-11-26 LAB — BASIC METABOLIC PANEL
CO2: 29 mEq/L (ref 19–32)
Chloride: 102 mEq/L (ref 96–112)
Potassium: 3.5 mEq/L (ref 3.5–5.1)

## 2012-11-26 LAB — BRAIN NATRIURETIC PEPTIDE: Pro B Natriuretic peptide (BNP): 26 pg/mL (ref 0.0–100.0)

## 2012-11-26 NOTE — Patient Instructions (Addendum)
Here is what your nurse wants to know: You have a history of systolic CHF with an EF of 25-30% when initially evaluated. This was due to a non-ischemic cardiomyopathy (no coronary artery disease on cardiac cath). Your ejection fraction (EF) improved to 55% (normal) in 11/2008 by echocardiogram.  LABS TODAY: BMET BNP  LABS TO BE DRAWN SAME DAY AS ECHO: FASTING  LFT, LIPIDS  Your physician has requested that you have an echocardiogram. Echocardiography is a painless test that uses sound waves to create images of your heart. It provides your doctor with information about the size and shape of your heart and how well your heart's chambers and valves are working. This procedure takes approximately one hour. There are no restrictions for this procedure.  FOLLOW UP WITH DR.ROSS IN 6 MONTHS

## 2012-11-26 NOTE — Progress Notes (Signed)
[address], [address] [name], [place]  [address] Phone: [phone] Fax:  [phone]  Date:  11/26/2012   ID:  Stacy Mcintosh, DOB November 01, 1955, MRN [id number]  PCP:  [name]  Cardiologist:  Dr. Marca Ancona     History of Present Illness: Stacy Mcintosh is a 57 y.o. female who returns for follow up.  She has a hx of hypothyroidism, nonischemic cardiomyopathy, and Pulmonary Embolism. Last echo in 2010 showed recovered LV systolic function. Hypothyroidism has been controlled by Armour thyroid. She sees Dr. Lucianne Muss for endocrine management.  Last seen by Dr. Shirlee Latch 03/2012. Spironolactone was discontinued. Follow up lipids demonstrated uncontrolled LDL and she was asked to restart simvastatin. Recommendation was to follow up in 1 year.  She has a Charity fundraiser that contacts her through her health insurance.  She noted some pedal edema recently that the patient attributed to her poor diet over the summer (traveling, weddings, family get-togethers, etc).  She has some mild dyspnea with more extreme activities, but no limitations.  She denies CP.  No orthopnea, PND, edema.  No syncope.    Labs (10/10):  BNP 13, K 3.7, creatinine 0.8  Labs (7/11):    K 4.1, creatinine 0.6  Labs (7/12):    LDL 143, HDL 74, K 3.3, creatinine 1.0, TSH normal  Labs (12/12):  K 4, creatinine 0.8, HDL 85, LDL 107  Labs (12/13):  K 4, Cr 0.9, TC 240, Trig 68, HDL 87.4, LDL 140.8   Wt Readings from Last 3 Encounters:  11/26/12 168 lb (76.204 kg)  04/09/12 160 lb (72.576 kg)  08/16/11 161 lb 6.4 oz (73.211 kg)     Past Medical History:  1. Hypothyroidism + history of Graves disease. She is status post radioactive iodine therapy. She then developed hypothyroidism, and the hypothyroidism has been difficult to control.  2. Cardiomyopathy, nonischemic. The patient had a cardiac MRI done during her hospitalization in July 2009. This showed moderate LV enlargement and global LV hypokinesis. There was a septal  paradpattern. EF was calculated to be 27%. There was mild left atrial enlargement. The RV was normal in size and function. There was no ASD or VSD. There was no evidence for delayed enhancement pattern in the LV myocardium suggesting that there was no scar from coronary artery disease and no definite evidence for infiltrative cardiomyopathy. HIV was negative. She did have an echo done also in the hospital in July 2009 that showed severe mitral regurgitation. A TEE done following this showed an EF of 25-30% with severe mitral regurgitation. 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 The patient did have a left and right heart catheterization after treatment for congestive heart failure in January 2010. There was no angiographic coronary disease. EF had improved to 55-60% with no significant mitral regurgitation. The hemodynamics were totally normal on the rightheart catheterization. The cardiomyopathy, I think, was likely du eto poorly controlled hypothyroidism. Echo (8/10): EF 50-55%, mild LV dilatation, mild-mod MR, PASP 32  3. PE/DVT. July 2009. The patient had been very sedentary prior to this due to her severe hypothyroidism. She had normal antithrombin III. She was negative for factor V Leiden. She had normal protein C and protein S function. Now off coumadin.  4. Hypertension.  5. History of left bundle-branch block.  6. Asthma.  7. Migraines.  8. ACEI cough  9. Low back pain: L-spine disease.    Current Outpatient Prescriptions  Medication Sig Dispense Refill  . ALPRAZolam Prudy Feeler)  0.5 MG tablet Take 0.5 mg by mouth 3 (three) times daily as needed.        Marland Kitchen aspirin 81 MG tablet Take 81 mg by mouth daily.        . carvedilol (COREG) 6.25 MG tablet Take 1 tablet (6.25 mg total) by mouth 2 (two) times daily.  60 tablet  6  . clonazePAM (KLONOPIN) 1 MG tablet Take 1 mg by mouth daily as needed.       . cyclobenzaprine (FLEXERIL) 5 MG tablet Take 5 mg by mouth as needed.       Marland Kitchen  losartan (COZAAR) 50 MG tablet Take 1 tablet (50 mg total) by mouth daily.  90 tablet  3  . losartan (COZAAR) 50 MG tablet TAKE 1 TABLET BY MOUTH DAILY.  30 tablet  2  . nortriptyline (PAMELOR) 25 MG capsule       . omeprazole (PRILOSEC) 20 MG capsule TAKE 1 CAPSULE (20 MG TOTAL) BY MOUTH 2 (TWO) TIMES DAILY.  60 capsule  5  . simvastatin (ZOCOR) 20 MG tablet Take 1 tablet (20 mg total) by mouth at bedtime.  30 tablet  3  . thyroid (ARMOUR) 90 MG tablet Take 90 mg by mouth daily. Except sat and sun       . traMADol (ULTRAM) 50 MG tablet Take 50 mg by mouth as needed.        No current facility-administered medications for this visit.    Allergies:    Allergies  Allergen Reactions  . Lisinopril     Social History:  The patient  reports that she has never smoked. She has never used smokeless tobacco. She reports that  drinks alcohol. She reports that she does not use illicit drugs.   ROS:  Please see the history of present illness.      All other systems reviewed and negative.   PHYSICAL EXAM: VS:  BP 132/82  Pulse 76  Ht 4\' 11"  (1.499 m)  Wt 168 lb (76.204 kg)  BMI 33.91 kg/m2 Well nourished, well developed, in no acute distress HEENT: normal Neck: no JVD Cardiac:  normal S1, S2; RRR; no murmur Lungs:  clear to auscultation bilaterally, no wheezing, rhonchi or rales Abd: soft, nontender, no hepatomegaly Ext: no edema Skin: warm and dry Neuro:  CNs 2-12 intact, no focal abnormalities noted  EKG:  NSR, HR 76, LBBB     ASSESSMENT AND PLAN:  1. Nonischemic Cardiomyopathy:  Appears to be stable.  Last assessment of EF on echo in 2010 was normal. She did have LE edema recently.  The RN that contacts her through her insurance had her take spironolactone that was left over from before for several weeks with relief.  It is presumed her CM was related to uncontrolled hypothyroidism.  She appears to be euvolemic today.  Will obtain a BMET, BNP.  Also arrange f/u echocardiogram.  If EF  stable, continue current Rx.  2. Hyperlipidemia:  Arrange f/u Lipids and LFTs. 3. Hypothyroidism:  Managed by endocrine.  4. History of Pulmonary Embolism:  She is no longer on coumadin.  No unilateral edema or symptoms to suggest recurrence.  5. Disposition:  Follow up with Dr. Marca Ancona in 6 mos.   Signed, Tereso Newcomer, PA-C  11/26/2012 3:11 PM

## 2012-12-06 ENCOUNTER — Ambulatory Visit: Payer: Self-pay | Admitting: Family Medicine

## 2012-12-06 ENCOUNTER — Encounter: Payer: Self-pay | Admitting: Family Medicine

## 2012-12-06 VITALS — BP 162/80 | HR 78 | Temp 97.7°F | Resp 16 | Ht 59.5 in | Wt 170.0 lb

## 2012-12-06 DIAGNOSIS — E039 Hypothyroidism, unspecified: Secondary | ICD-10-CM

## 2012-12-06 MED ORDER — THYROID 90 MG PO TABS: 90.0000 mg | ORAL_TABLET | Freq: Every day | ORAL | Status: DC

## 2012-12-06 NOTE — Patient Instructions (Addendum)
I have refilled your Armour thyroid for next 6 months. If there are any significant changes before next visit, please let us know. Managing thyroid medication is an important part of stable congestive heart failure.

## 2012-12-06 NOTE — Progress Notes (Deleted)
  Subjective:    Patient ID: Stacy Mcintosh, female    DOB: 1955-05-05, 57 y.o.   MRN: [id number]  HPI  This     Review of Systems     Objective:   Physical Exam        Assessment & Plan:

## 2012-12-08 NOTE — Progress Notes (Signed)
S:  This 57 y.o. AA female is new to [facility]; she was seen once in 2013 at [facility]. Pt has Hx of CHF w/ cardiomyopathy and HTN; this is managed by [facility] Cardiologist. She also has thyroid disorder, recently treated by endocrine specialist; recent changes in insurance have lead pt to seek care here for medication refill until she can determine what her new insurance covers. She take Armour thyroid w/ dose change recently to 90 mg daily except 1/2 tablet on Sundays. She feels good on this dose. 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 She has good nutrition and sleep hygiene. She has no c/o medications adverse effects. Pt sees a psychiatrist for anxiety related disorder; her medications are managed by Dr. Evelene Croon.  PMHx, Soc Hx and Fam Hx reviewed. Problem List reviewed. Medications reconciled.  ROS: As per HPI; negative for fever/chills, diaphoresis, abnormal weight change, vision disturbances, CP or tightness, palpitations, SOB or cough, HA, dizziness, weakness, tremor or syncope.  O:  Filed Vitals:   12/06/12 1442  BP: 162/80  Pulse: 78  Temp: 97.7 F (36.5 C)  Resp: 16   GEN: In NAD; WN,WD. HENT: [place]/AT; EOMI w/ clear conj/sclerae. Otherwise unremarkable. COR: RRR. Trace ankle edema. LUNGS: Normal resp rate and effort. SKIN: W&D; no erythema or rashes. MS: MAEs; no deformities, effusions or muscle atrophy. NEURO: A&O x 3; CNs intact. Nonfocal.  A/P: Unspecified hypothyroidism  Meds ordered this encounter  Medications  . thyroid (ARMOUR) 90 MG tablet    Sig: Take 1 tablet (90 mg total) by mouth daily except take 1/2 tab on Sunday.    Dispense:  30 tablet    Refill:  5   Follow-up w/ Dr. Shirlee Latch as scheduled. Pt is due to have labs drawn by cardiologist practice but she may defer these tests (as she deferred labs today at this visit) due to cost.

## 2012-12-17 ENCOUNTER — Other Ambulatory Visit (HOSPITAL_COMMUNITY): Payer: 59

## 2012-12-17 ENCOUNTER — Other Ambulatory Visit: Payer: 59

## 2012-12-25 ENCOUNTER — Other Ambulatory Visit: Payer: Self-pay | Admitting: Cardiology

## 2013-01-22 ENCOUNTER — Other Ambulatory Visit: Payer: Self-pay | Admitting: Physician Assistant

## 2013-01-22 ENCOUNTER — Other Ambulatory Visit: Payer: Self-pay | Admitting: Cardiology

## 2013-02-28 ENCOUNTER — Other Ambulatory Visit: Payer: Self-pay

## 2013-04-03 ENCOUNTER — Other Ambulatory Visit: Payer: Self-pay | Admitting: Cardiology

## 2013-04-12 ENCOUNTER — Other Ambulatory Visit: Payer: Self-pay | Admitting: Cardiology

## 2013-05-13 ENCOUNTER — Other Ambulatory Visit: Payer: Self-pay | Admitting: Family Medicine

## 2013-06-13 ENCOUNTER — Other Ambulatory Visit: Payer: Self-pay | Admitting: Family Medicine

## 2013-07-13 ENCOUNTER — Other Ambulatory Visit: Payer: Self-pay | Admitting: Family Medicine

## 2013-07-14 NOTE — Telephone Encounter (Signed)
Needs OV, 2nd notice

## 2013-07-27 ENCOUNTER — Other Ambulatory Visit: Payer: Self-pay | Admitting: Cardiology

## 2013-08-19 ENCOUNTER — Other Ambulatory Visit: Payer: Self-pay | Admitting: Physician Assistant

## 2013-08-25 ENCOUNTER — Other Ambulatory Visit: Payer: Self-pay | Admitting: Internal Medicine

## 2013-08-25 ENCOUNTER — Other Ambulatory Visit: Payer: Self-pay | Admitting: Physician Assistant

## 2013-08-29 ENCOUNTER — Other Ambulatory Visit: Payer: Self-pay | Admitting: Family Medicine

## 2013-09-02 ENCOUNTER — Other Ambulatory Visit: Payer: Self-pay | Admitting: Family Medicine

## 2013-09-12 ENCOUNTER — Encounter: Payer: Self-pay | Admitting: Family Medicine

## 2013-09-12 ENCOUNTER — Ambulatory Visit: Payer: 59 | Admitting: Family Medicine

## 2013-09-12 VITALS — BP 154/66 | HR 74 | Temp 98.0°F | Resp 16 | Ht 59.5 in | Wt 170.8 lb

## 2013-09-12 DIAGNOSIS — F4321 Adjustment disorder with depressed mood: Secondary | ICD-10-CM

## 2013-09-12 DIAGNOSIS — I10 Essential (primary) hypertension: Secondary | ICD-10-CM

## 2013-09-12 DIAGNOSIS — E890 Postprocedural hypothyroidism: Secondary | ICD-10-CM

## 2013-09-12 LAB — COMPREHENSIVE METABOLIC PANEL
ALT: 17 U/L (ref 0–35)
AST: 18 U/L (ref 0–37)
Albumin: 4.5 g/dL (ref 3.5–5.2)
Alkaline Phosphatase: 75 U/L (ref 39–117)
BILIRUBIN TOTAL: 0.4 mg/dL (ref 0.2–1.2)
BUN: 10 mg/dL (ref 6–23)
CALCIUM: 9.8 mg/dL (ref 8.4–10.5)
CHLORIDE: 103 meq/L (ref 96–112)
CO2: 27 meq/L (ref 19–32)
Creat: 0.81 mg/dL (ref 0.50–1.10)
GLUCOSE: 93 mg/dL (ref 70–99)
Potassium: 4 mEq/L (ref 3.5–5.3)
SODIUM: 138 meq/L (ref 135–145)
TOTAL PROTEIN: 7.4 g/dL (ref 6.0–8.3)

## 2013-09-12 MED ORDER — OMEPRAZOLE 20 MG PO CPDR: DELAYED_RELEASE_CAPSULE | ORAL | Status: DC

## 2013-09-12 MED ORDER — THYROID 90 MG PO TABS: ORAL_TABLET | ORAL | Status: DC

## 2013-09-12 NOTE — Progress Notes (Signed)
Subjective:    Patient ID: Stacy Mcintosh, female    DOB: [birth year], 58 y.o.   MRN: [id number]  HPI  This 58 y.o. AA female returns for thyroid function labs and medication refills. She takes Armour Thyroid daily; compliance is good. No missed doses. She feels fatigues due to poor sleep hygiene and depression w/ anxiety. There is marital discord and pt is ready to end the relationship. She and her husband are in counseling but pt feels it is too late to rectify damage done by past behavior. Her husband snores but will not get evaluation performed. Pt has to get up in middle of night to sleep elsewhere. She has chronic psychiatric care and has tried multiple anti-depressants- "None of them work for me".  HTN- pt is compliant w/ medication, w/o adverse medication effects.   Patient Active Problem List   Diagnosis Date Noted  . 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 PE (pulmonary embolism) 01/25/2011  . 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 OTHER AND UNSPECIFIED HYPERLIPIDEMIA 01/19/2010  . PURE HYPERCHOLESTEROLEMIA 11/18/2008  . CARDIOMYOPATHY 11/12/2008  . BUNDLE BRANCH BLOCK, LEFT 11/12/2008  . HYPERTENSION 10/16/2007  . HEADACHE, CHRONIC 10/16/2007   Prior to Admission medications   Medication Sig Start Date End Date Taking? Authorizing Provider  ALPRAZolam Prudy Feeler(XANAX) 0.5 MG tablet Take 0.5 mg by mouth 3 (three) times daily as needed.     Yes Historical Provider, MD  aspirin 81 MG tablet Take 81 mg by mouth daily.     Yes Historical Provider, MD  carvedilol (COREG) 6.25 MG tablet TAKE 1 TABLET BY MOUTH 2 (TWO) TIMES DAILY.   Yes Pricilla RifflePaula V Ross, MD  clonazePAM (KLONOPIN) 1 MG tablet Take 1 mg by mouth daily as needed.  10/12/10  Yes Historical Provider, MD  nortriptyline (PAMELOR) 25 MG capsule  10/13/12  Yes Historical Provider, MD  omeprazole (PRILOSEC) 20 MG capsule TAKE ONE CAPSULE BY MOUTH TWICE A DAY.   Yes Maurice MarchBarbara B Kerin Cecchi, MD  simvastatin (ZOCOR) 20 MG tablet TAKE 1  TABLET BY MOUTH AT BEDTIME. 04/03/13  Yes Laurey Moralealton S McLean, MD  thyroid [facility](ARMOUR THYROID) 90 MG tablet TAKE 1 TAB DAILY EXCEPT 1/2 TAB ON SUNDAY   Yes Maurice MarchBarbara B Jesus Nevills, MD  cyclobenzaprine (FLEXERIL) 5 MG tablet Take 5 mg by mouth as needed.  10/25/10   Historical Provider, MD  losartan (COZAAR) 50 MG tablet TAKE 1 TABLET BY MOUTH DAILY.    Laurey Moralealton S McLean, MD  traMADol (ULTRAM) 50 MG tablet Take 50 mg by mouth as needed.  10/25/10   Historical Provider, MD   PMHx, Surg Hx, Soc and Fam Hx reviewed.   Review of Systems  Constitutional: Positive for fatigue. Negative for fever, diaphoresis, appetite change and unexpected weight change.  Eyes: Negative.   Respiratory: Negative.   Cardiovascular: Negative.   Gastrointestinal: Negative for nausea, abdominal pain, diarrhea and constipation.  Endocrine: Negative.   Musculoskeletal: Positive for myalgias. Negative for arthralgias, back pain and joint swelling.  Skin: Negative.   Neurological: Negative.   Psychiatric/Behavioral: Negative.       Objective:   Physical Exam  Nursing note and vitals reviewed. Constitutional: She is oriented to person, place, and time. She appears well-developed and well-nourished. No distress.  HENT:  Head: Normocephalic and atraumatic.  Right Ear: External ear normal.  Left Ear: External ear normal.  Nose: Nose normal.  Mouth/Throat: Oropharynx is clear and moist.  Eyes: Conjunctivae and EOM are normal. Pupils are equal, round, and reactive to  light. No scleral icterus.  Neck: Normal range of motion. Neck supple. No thyromegaly present.  Cardiovascular: Normal rate, regular rhythm and normal heart sounds.  Exam reveals no gallop and no friction rub.   No murmur heard. Pulmonary/Chest: Effort normal and breath sounds normal. No respiratory distress.  Musculoskeletal: Normal range of motion. She exhibits no edema and no tenderness.  Neurological: She is alert and oriented to person, place, and time. She displays  no atrophy and no tremor. No cranial nerve deficit or sensory deficit. She exhibits normal muscle tone. Coordination and gait normal.  Skin: Skin is warm and dry. No rash noted. She is not diaphoretic. No erythema.  Psychiatric: Her speech is normal and behavior is normal. Judgment and thought content normal. Her mood appears not anxious. Her affect is not angry, not labile and not inappropriate. Cognition and memory are normal. She exhibits a depressed mood.      Assessment & Plan:  Other postablative hypothyroidism - Continue Armour Thyroid 90 mg pending lab results.       Plan: Thyroid Panel With TSH, Vitamin D, 25-hydroxy  HYPERTENSION - No medication change at this time; pt plans to schedule follow-up with Cardiology.             Plan: Comprehensive metabolic panel, Vitamin D, 25-hydroxy  Situational depression - Reviewed Depression score  w/ pt. No current plan for self-harm, SI/HI. Is confident that once her current marital situation is resolved, she will be in  much better frame of mind. Follow-up w/ Psychiatry. Has assistance through [employer]. Continue current medications.     Plan: Vitamin D, 25-hydroxy  Meds ordered this encounter  Medications  . thyroid (ARMOUR THYROID) 90 MG tablet    Sig: TAKE 1 TAB DAILY EXCEPT 1/2 TAB ON SUNDAY    Dispense:  30 tablet    Refill:  1  . omeprazole (PRILOSEC) 20 MG capsule    Sig: TAKE ONE CAPSULE BY MOUTH TWICE A DAY.    Dispense:  30 capsule    Refill:  5

## 2013-09-12 NOTE — Patient Instructions (Addendum)
Hypothyroidism The thyroid is a large gland located in the lower front of your neck. The thyroid gland helps control metabolism. Metabolism is how your body handles food. It controls metabolism with the hormone thyroxine. When this gland is underactive (hypothyroid), it produces too little hormone.  CAUSES These include:   Absence or destruction of thyroid tissue.  Goiter due to iodine deficiency.  Goiter due to medications.  Congenital defects (since birth).  Problems with the pituitary. This causes a lack of TSH (thyroid stimulating hormone). This hormone tells the thyroid to turn out more hormone. SYMPTOMS  Lethargy (feeling as though you have no energy)  Cold intolerance  Weight gain (in spite of normal food intake)  Dry skin  Coarse hair  Menstrual irregularity (if severe, may lead to infertility)  Slowing of thought processes Cardiac problems are also caused by insufficient amounts of thyroid hormone. Hypothyroidism in the newborn is cretinism, and is an extreme form. It is important that this form be treated adequately and immediately or it will lead rapidly to retarded physical and mental development. DIAGNOSIS  To prove hypothyroidism, your caregiver may do blood tests and ultrasound tests. Sometimes the signs are hidden. It may be necessary for your caregiver to watch this illness with blood tests either before or after diagnosis and treatment. TREATMENT  Low levels of thyroid hormone are increased by using synthetic thyroid hormone. This is a safe, effective treatment. It usually takes about four weeks to gain the full effects of the medication. After you have the full effect of the medication, it will generally take another four weeks for problems to leave. Your caregiver may start you on low doses. If you have had heart problems the dose may be gradually increased. It is generally not an emergency to get rapidly to normal. HOME CARE INSTRUCTIONS   Take your  medications as your caregiver suggests. Let your caregiver know of any medications you are taking or start taking. Your caregiver will help you with dosage schedules.  As your condition improves, your dosage needs may increase. It will be necessary to have continuing blood tests as suggested by your caregiver.  Report all suspected medication side effects to your caregiver. SEEK MEDICAL CARE IF: Seek medical care if you develop:  Sweating.  Tremulousness (tremors).  Anxiety.  Rapid weight loss.  Heat intolerance.  Emotional swings.  Diarrhea.  Weakness. SEEK IMMEDIATE MEDICAL CARE IF:  You develop chest pain, an irregular heart beat (palpitations), or a rapid heart beat. MAKE SURE YOU:   Understand these instructions.  Will watch your condition.  Will get help right away if you are not doing well or get worse. Document Released: 04/11/2005 Document Revised: 07/04/2011 Document Reviewed: 11/30/2007 [facility] Patient Information 2014 [place], [place].    Vitamin D Deficiency Vitamin D is an important vitamin that your body needs. Having too little of it in your body is called a deficiency. A very bad deficiency can make your bones soft and can cause a condition called rickets.  Vitamin D is important to your body for different reasons, such as:   It helps your body absorb 2 minerals called calcium and phosphorus.  It helps make your bones healthy.  It may prevent some diseases, such as diabetes and multiple sclerosis.  It helps your muscles and heart. You can get vitamin D in several ways. It is a natural part of some foods. The vitamin is also added to some dairy products and cereals. Some people take vitamin D supplements.  Also, your body makes vitamin D when you are in the sun. It changes the sun's rays into a form of the vitamin that your body can use. CAUSES   Not eating enough foods that contain vitamin D.  Not getting enough sunlight.  Having certain  digestive system diseases that make it hard to absorb vitamin D. These diseases include Crohn's disease, chronic pancreatitis, and cystic fibrosis.  Having a surgery in which part of the stomach or small intestine is removed.  Being obese. Fat cells pull vitamin D out of your blood. That means that obese people may not have enough vitamin D left in their blood and in other body tissues.  Having chronic kidney or liver disease. RISK FACTORS Risk factors are things that make you more likely to develop a vitamin D deficiency. They include:  Being older.  Not being able to get outside very much.  Living in a nursing home.  Having had broken bones.  Having weak or thin bones (osteoporosis).  Having a disease or condition that changes how your body absorbs vitamin D.  Having dark skin.  Some medicines such as seizure medicines or steroids.  Being overweight or obese. SYMPTOMS Mild cases of vitamin D deficiency may not have any symptoms. If you have a very bad case, symptoms may include:  Bone pain.  Muscle pain.  Falling often.  Broken bones caused by a minor injury, due to osteoporosis. DIAGNOSIS A blood test is the best way to tell if you have a vitamin D deficiency. TREATMENT Vitamin D deficiency can be treated in different ways. Treatment for vitamin D deficiency depends on what is causing it. Options include:  Taking vitamin D supplements.  Taking a calcium supplement. Your caregiver will suggest what dose is best for you. HOME CARE INSTRUCTIONS  Take any supplements that your caregiver prescribes. Follow the directions carefully. Take only the suggested amount.  Have your blood tested 2 months after you start taking supplements.  Eat foods that contain vitamin D. Healthy choices include:  Fortified dairy products, cereals, or juices. Fortified means vitamin D has been added to the food. Check the label on the package to be sure.  Fatty fish like salmon or  trout.  Eggs.  Oysters.  Do not use a tanning bed.  Keep your weight at a healthy level. Lose weight if you need to.  Keep all follow-up appointments. Your caregiver will need to perform blood tests to make sure your vitamin D deficiency is going away. SEEK MEDICAL CARE IF:  You have any questions about your treatment.  You continue to have symptoms of vitamin D deficiency.  You have nausea or vomiting.  You are constipated.  You feel confused.  You have severe abdominal or back pain. MAKE SURE YOU:  Understand these instructions.  Will watch your condition.  Will get help right away if you are not doing well or get worse. Document Released: 07/04/2011 Document Revised: 08/06/2012 Document Reviewed: 07/04/2011 [facility] Patient Information 2014 [place]    You have asked to have mammogram results sent to our clinic. You can name me as your primary physician/ PCP.  Discuss your depressive symptoms with your psychiatrist.   Depression, Adult Depression is feeling sad, low, down in the dumps, blue, gloomy, or empty. In general, there are two kinds of depression:  Normal sadness or grief. This can happen after something upsetting. It often goes away on its own within 2 weeks. After losing a loved one (bereavement), normal  sadness and grief may last longer than two weeks. It usually gets better with time.  Clinical depression. This kind lasts longer than normal sadness or grief. It keeps you from doing the things you normally do in life. It is often hard to function at home, work, or at school. It may affect your relationships with others. Treatment is often needed. GET HELP RIGHT AWAY IF:  You have thoughts about hurting yourself or others.  You lose touch with reality (psychotic symptoms). You may:  See or hear things that are not real.  Have untrue beliefs about your life or people around you.  Your medicine is giving you problems. MAKE SURE  YOU:  Understand these instructions.  Will watch your condition.  Will get help right away if you are not doing well or get worse. Document Released: 05/14/2010 Document Revised: 01/04/2012 Document Reviewed: 08/11/2011 [facility] Patient Information 2014 [place], [place].

## 2013-09-13 LAB — THYROID PANEL WITH TSH
Free Thyroxine Index: 2.3 (ref 1.0–3.9)
T3 Uptake: 31.6 % (ref 22.5–37.0)
T4, Total: 7.4 ug/dL (ref 5.0–12.5)
TSH: 5.563 u[IU]/mL — ABNORMAL HIGH (ref 0.350–4.500)

## 2013-09-13 LAB — VITAMIN D 25 HYDROXY (VIT D DEFICIENCY, FRACTURES): VIT D 25 HYDROXY: 33 ng/mL (ref 30–89)

## 2013-09-21 ENCOUNTER — Other Ambulatory Visit: Payer: Self-pay | Admitting: Cardiology

## 2013-10-23 ENCOUNTER — Other Ambulatory Visit: Payer: Self-pay | Admitting: Cardiology

## 2013-11-03 ENCOUNTER — Ambulatory Visit (INDEPENDENT_AMBULATORY_CARE_PROVIDER_SITE_OTHER): Payer: 59 | Admitting: Emergency Medicine

## 2013-11-03 VITALS — BP 128/78 | HR 93 | Temp 97.9°F | Resp 15 | Ht 59.5 in | Wt 167.0 lb

## 2013-11-03 DIAGNOSIS — J018 Other acute sinusitis: Secondary | ICD-10-CM

## 2013-11-03 DIAGNOSIS — H1030 Unspecified acute conjunctivitis, unspecified eye: Secondary | ICD-10-CM

## 2013-11-03 MED ORDER — TOBRAMYCIN 0.3 % OP SOLN: 2.0000 [drp] | OPHTHALMIC | Status: DC

## 2013-11-03 MED ORDER — PROMETHAZINE-CODEINE 6.25-10 MG/5ML PO SYRP: 5.0000 mL | ORAL_SOLUTION | Freq: Four times a day (QID) | ORAL | Status: DC | PRN

## 2013-11-03 MED ORDER — AMOXICILLIN-POT CLAVULANATE 875-125 MG PO TABS: 1.0000 | ORAL_TABLET | Freq: Two times a day (BID) | ORAL | Status: DC

## 2013-11-03 MED ORDER — PSEUDOEPHEDRINE-GUAIFENESIN ER 60-600 MG PO TB12: 1.0000 | ORAL_TABLET | Freq: Two times a day (BID) | ORAL | Status: DC

## 2013-11-03 NOTE — Progress Notes (Addendum)
[facility] [address] [place] [address] [phone]  Date:  11/03/2013   Name:  Stacy Mcintosh   DOB:  March 20, 1956   MRN:  [id number]  PCP:  Dow Adolph, MD    Chief Complaint: Cough, Sore Throat, Otalgia and Eye Problem   History of Present Illness:  Stacy Mcintosh is a 58 y.o. very pleasant female patient who presents with the following:  Ill since Thursday with purulent nasal drainage and post nasal drip.  Sore throat, pain in right ear.  Non productive cough.  No wheezing or shortness of breath.  Bilateral conjunctival injection and gluing  No fever or chills.  No nausea or vomiting.  No stool change or rash.  No improvement with over the counter medications or other home remedies. Denies other complaint or health concern today.   Patient Active Problem List   Diagnosis Date Noted  . 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 PE (pulmonary embolism) 01/25/2011  . 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 OTHER AND UNSPECIFIED HYPERLIPIDEMIA 01/19/2010  . PURE HYPERCHOLESTEROLEMIA 11/18/2008  . CARDIOMYOPATHY 11/12/2008  . BUNDLE BRANCH BLOCK, LEFT 11/12/2008  . HYPERTENSION 10/16/2007  . HEADACHE, CHRONIC 10/16/2007    Past Medical History  Diagnosis Date  . Arthritis   . Depression   . Hx of migraines   . Pure hypercholesterolemia 11/18/2008  . CARDIOMYOPATHY 11/12/2008  . HYPERTENSION 10/16/2007  . Other and unspecified hyperlipidemia 01/19/2010  . BUNDLE BRANCH BLOCK, LEFT 11/12/2008  . ASTHMA 10/16/2007  . Hypothyroidism     h/o Graves Disease  . PE (pulmonary embolism) 2009    Factor V negative    Past Surgical History  Procedure Laterality Date  . Abdominal hysterectomy    . Cesarean section      x3    History  Substance Use Topics  . Smoking status: Never Smoker   . Smokeless tobacco: Never Used  . Alcohol Use: Yes     Comment: rare    Family History  Problem Relation Age of Onset  . Heart attack Mother   .  Hypertension Mother   . Rheumatologic disease Mother   . Ovarian cancer Mother   . Prostate cancer Father     Prostate Cancer  . Arthritis Sister   . Thyroid disease Brother     Parathyroid problem  . Diabetes Brother   . Heart attack Brother     Allergies  Allergen Reactions  . Lisinopril     Medication list has been reviewed and updated.  Current Outpatient Prescriptions on File Prior to Visit  Medication Sig Dispense Refill  . ALPRAZolam (XANAX) 0.5 MG tablet Take 0.5 mg by mouth 3 (three) times daily as needed.        Marland Kitchen aspirin 81 MG tablet Take 81 mg by mouth daily.        . carvedilol (COREG) 6.25 MG tablet TAKE 1 TABLET BY MOUTH 2 (TWO) TIMES DAILY.  60 tablet  1  . clonazePAM (KLONOPIN) 1 MG tablet Take 1 mg by mouth daily as needed.       Marland Kitchen losartan (COZAAR) 50 MG tablet TAKE 1 TABLET BY MOUTH DAILY.  30 tablet  0  . omeprazole (PRILOSEC) 20 MG capsule TAKE ONE CAPSULE BY MOUTH TWICE A DAY.  30 capsule  5  . simvastatin (ZOCOR) 20 MG tablet TAKE 1 TABLET BY MOUTH AT BEDTIME.  30 tablet  0  . thyroid (ARMOUR THYROID) 90 MG tablet TAKE 1 TAB DAILY EXCEPT  1/2 TAB ON SUNDAY  30 tablet  1  . cyclobenzaprine (FLEXERIL) 5 MG tablet Take 5 mg by mouth as needed.       . nortriptyline (PAMELOR) 25 MG capsule       . traMADol (ULTRAM) 50 MG tablet Take 50 mg by mouth as needed.        No current facility-administered medications on file prior to visit.    Review of Systems:  As per HPI, otherwise negative.    Physical Examination: Filed Vitals:   11/03/13 0914  BP: 128/78  Pulse: 93  Temp: 97.9 F (36.6 C)  Resp: 15   Filed Vitals:   11/03/13 0914  Height: 4' 11.5" (1.511 m)  Weight: 167 lb (75.751 kg)   Body mass index is 33.18 kg/(m^2). Ideal Body Weight: Weight in (lb) to have BMI = 25: 125.6  GEN: WDWN, NAD, Non-toxic, A & O x 3 HEENT: Atraumatic, Normocephalic. Neck supple. No masses, No LAD. Bilateral conjunctivitis Ears and Nose: No external  deformity.  Purulent draiange CV: RRR, No M/G/R. No JVD. No thrill. No extra heart sounds. PULM: CTA B, no wheezes, crackles, rhonchi. No retractions. No resp. distress. No accessory muscle use. ABD: S, NT, ND, +BS. No rebound. No HSM. EXTR: No c/c/e NEURO Normal gait.  PSYCH: Normally interactive. Conversant. Not depressed or anxious appearing.  Calm demeanor.    Assessment and Plan: Sinusitis augmentin mucinex d Conjunctivitis tobrex   Signed,  Phillips OdorJeffery Lesli Issa, MD

## 2013-11-03 NOTE — Addendum Note (Signed)
Addended by: Carmelina Dane on: 11/03/2013 09:43 AM   Modules accepted: Orders

## 2013-11-03 NOTE — Patient Instructions (Signed)
Sinusitis Sinusitis is redness, soreness, and swelling (inflammation) of the paranasal sinuses. Paranasal sinuses are air pockets within the bones of your face (beneath the eyes, the middle of the forehead, or above the eyes). In healthy paranasal sinuses, mucus is able to drain out, and air is able to circulate through them by way of your nose. However, when your paranasal sinuses are inflamed, mucus and air can become trapped. This can allow bacteria and other germs to grow and cause infection. Sinusitis can develop quickly and last only a short time (acute) or continue over a long period (chronic). Sinusitis that lasts for more than 12 weeks is considered chronic.  CAUSES  Causes of sinusitis include:  Allergies.  Structural abnormalities, such as displacement of the cartilage that separates your nostrils (deviated septum), which can decrease the air flow through your nose and sinuses and affect sinus drainage.  Functional abnormalities, such as when the small hairs (cilia) that line your sinuses and help remove mucus do not work properly or are not present. SYMPTOMS  Symptoms of acute and chronic sinusitis are the same. The primary symptoms are pain and pressure around the affected sinuses. Other symptoms include:  Upper toothache.  Earache.  Headache.  Bad breath.  Decreased sense of smell and taste.  A cough, which worsens when you are lying flat.  Fatigue.  Fever.  Thick drainage from your nose, which often is green and may contain pus (purulent).  Swelling and warmth over the affected sinuses. DIAGNOSIS  Your caregiver will perform a physical exam. During the exam, your caregiver may:  Look in your nose for signs of abnormal growths in your nostrils (nasal polyps).  Tap over the affected sinus to check for signs of infection.  View the inside of your sinuses (endoscopy) with a special imaging device with a light attached (endoscope), which is inserted into your  sinuses. If your caregiver suspects that you have chronic sinusitis, one or more of the following tests may be recommended:  Allergy tests.  Nasal culture--A sample of mucus is taken from your nose and sent to a lab and screened for bacteria.  Nasal cytology--A sample of mucus is taken from your nose and examined by your caregiver to determine if your sinusitis is related to an allergy. TREATMENT  Most cases of acute sinusitis are related to a viral infection and will resolve on their own within 10 days. Sometimes medicines are prescribed to help relieve symptoms (pain medicine, decongestants, nasal steroid sprays, or saline sprays).  However, for sinusitis related to a bacterial infection, your caregiver will prescribe antibiotic medicines. These are medicines that will help kill the bacteria causing the infection.  Rarely, sinusitis is caused by a fungal infection. In theses cases, your caregiver will prescribe antifungal medicine. For some cases of chronic sinusitis, surgery is needed. Generally, these are cases in which sinusitis recurs more than 3 times per year, despite other treatments. HOME CARE INSTRUCTIONS   Drink plenty of water. Water helps thin the mucus so your sinuses can drain more easily.  Use a humidifier.  Inhale steam 3 to 4 times a day (for example, sit in the bathroom with the shower running).  Apply a warm, moist washcloth to your face 3 to 4 times a day, or as directed by your caregiver.  Use saline nasal sprays to help moisten and clean your sinuses.  Take over-the-counter or prescription medicines for pain, discomfort, or fever only as directed by your caregiver. SEEK IMMEDIATE MEDICAL CARE IF:    You have increasing pain or severe headaches.  You have nausea, vomiting, or drowsiness.  You have swelling around your face.  You have vision problems.  You have a stiff neck.  You have difficulty breathing. MAKE SURE YOU:   Understand these  instructions.  Will watch your condition.  Will get help right away if you are not doing well or get worse. Document Released: 04/11/2005 Document Revised: 07/04/2011 Document Reviewed: 04/26/2011 Exit[employer] Patient Information 2015 [employer]. This information is not intended to replace advice given to you by your health care provider. Make sure you discuss any questions you have with your health care provider.  

## 2013-11-07 NOTE — Telephone Encounter (Signed)
Close Encounter 

## 2013-11-12 ENCOUNTER — Other Ambulatory Visit: Payer: Self-pay | Admitting: Family Medicine

## 2013-11-16 ENCOUNTER — Other Ambulatory Visit: Payer: Self-pay | Admitting: Cardiology

## 2013-11-22 ENCOUNTER — Other Ambulatory Visit: Payer: Self-pay | Admitting: Internal Medicine

## 2013-11-25 ENCOUNTER — Telehealth: Payer: Self-pay

## 2013-11-25 ENCOUNTER — Other Ambulatory Visit: Payer: Self-pay | Admitting: Emergency Medicine

## 2013-11-25 MED ORDER — BENZONATATE 200 MG PO CAPS: 200.0000 mg | ORAL_CAPSULE | Freq: Two times a day (BID) | ORAL | Status: DC | PRN

## 2013-11-25 NOTE — Telephone Encounter (Signed)
Pt requesting Tessalon Pearles for her cough seen on 7/12 and cough is still persisting.

## 2013-11-25 NOTE — Telephone Encounter (Signed)
Patient says that her sinus infection is not improving. She says that her daughter is a NP and gave her tessalon perles and she finally got a good nights rest. She wants to know if she can have that prescribed for her cough.

## 2013-11-25 NOTE — Telephone Encounter (Signed)
sent 

## 2013-12-23 ENCOUNTER — Other Ambulatory Visit: Payer: Self-pay | Admitting: Family Medicine

## 2013-12-23 DIAGNOSIS — Z1231 Encounter for screening mammogram for malignant neoplasm of breast: Secondary | ICD-10-CM

## 2013-12-26 ENCOUNTER — Ambulatory Visit (HOSPITAL_COMMUNITY)
Admission: RE | Admit: 2013-12-26 | Discharge: 2013-12-26 | Disposition: A | Discharge status: Home / Self Care | Payer: 59 | Source: Ambulatory Visit | Attending: Family Medicine | Admitting: Family Medicine

## 2013-12-26 DIAGNOSIS — Z1231 Encounter for screening mammogram for malignant neoplasm of breast: Secondary | ICD-10-CM

## 2014-01-16 ENCOUNTER — Ambulatory Visit: Payer: 59 | Admitting: Cardiology

## 2014-01-16 ENCOUNTER — Other Ambulatory Visit: Payer: 59

## 2014-01-18 ENCOUNTER — Other Ambulatory Visit: Payer: Self-pay | Admitting: Cardiology

## 2014-01-27 ENCOUNTER — Other Ambulatory Visit: Payer: Self-pay | Admitting: Internal Medicine

## 2014-02-08 ENCOUNTER — Other Ambulatory Visit: Payer: Self-pay | Admitting: Family Medicine

## 2014-02-18 ENCOUNTER — Ambulatory Visit: Payer: 59 | Admitting: Family Medicine

## 2014-02-27 ENCOUNTER — Ambulatory Visit (INDEPENDENT_AMBULATORY_CARE_PROVIDER_SITE_OTHER): Payer: 59 | Admitting: Family Medicine

## 2014-02-27 ENCOUNTER — Encounter: Payer: Self-pay | Admitting: Family Medicine

## 2014-02-27 ENCOUNTER — Other Ambulatory Visit: Payer: Self-pay | Admitting: Cardiology

## 2014-02-27 VITALS — BP 136/78 | HR 93 | Temp 98.2°F | Resp 16 | Ht 60.0 in | Wt 167.0 lb

## 2014-02-27 DIAGNOSIS — F329 Major depressive disorder, single episode, unspecified: Secondary | ICD-10-CM

## 2014-02-27 DIAGNOSIS — Z0000 Encounter for general adult medical examination without abnormal findings: Secondary | ICD-10-CM

## 2014-02-27 DIAGNOSIS — E7800 Pure hypercholesterolemia, unspecified: Secondary | ICD-10-CM

## 2014-02-27 DIAGNOSIS — E890 Postprocedural hypothyroidism: Secondary | ICD-10-CM

## 2014-02-27 DIAGNOSIS — F32A Depression, unspecified: Secondary | ICD-10-CM | POA: Insufficient documentation

## 2014-02-27 LAB — CBC WITH DIFFERENTIAL/PLATELET
Basophils Absolute: 0 10*3/uL (ref 0.0–0.1)
Basophils Relative: 0 % (ref 0–1)
EOS ABS: 0 10*3/uL (ref 0.0–0.7)
Eosinophils Relative: 0 % (ref 0–5)
HCT: 38.7 % (ref 36.0–46.0)
Hemoglobin: 13.1 g/dL (ref 12.0–15.0)
LYMPHS ABS: 1.9 10*3/uL (ref 0.7–4.0)
Lymphocytes Relative: 9 % — ABNORMAL LOW (ref 12–46)
MCH: 29.8 pg (ref 26.0–34.0)
MCHC: 33.9 g/dL (ref 30.0–36.0)
MCV: 88 fL (ref 78.0–100.0)
MONOS PCT: 4 % (ref 3–12)
Monocytes Absolute: 0.8 10*3/uL (ref 0.1–1.0)
Neutro Abs: 18 10*3/uL — ABNORMAL HIGH (ref 1.7–7.7)
Neutrophils Relative %: 87 % — ABNORMAL HIGH (ref 43–77)
Platelets: 541 10*3/uL — ABNORMAL HIGH (ref 150–400)
RBC: 4.4 MIL/uL (ref 3.87–5.11)
RDW: 14.5 % (ref 11.5–15.5)
WBC: 20.7 10*3/uL — ABNORMAL HIGH (ref 4.0–10.5)

## 2014-02-27 LAB — LIPID PANEL
Cholesterol: 170 mg/dL (ref 0–200)
HDL: 76 mg/dL (ref >39)
LDL Cholesterol: 78 mg/dL (ref 0–99)
TRIGLYCERIDES: 80 mg/dL (ref <150)
Total CHOL/HDL Ratio: 2.2 Ratio
VLDL: 16 mg/dL (ref 0–40)

## 2014-02-27 LAB — POCT URINALYSIS DIPSTICK
BILIRUBIN UA: NEGATIVE
Blood, UA: NEGATIVE
GLUCOSE UA: NEGATIVE
KETONES UA: NEGATIVE
LEUKOCYTES UA: NEGATIVE
Nitrite, UA: NEGATIVE
PROTEIN UA: NEGATIVE
Spec Grav, UA: 1.01
Urobilinogen, UA: 0.2
pH, UA: 6

## 2014-02-27 LAB — BASIC METABOLIC PANEL WITH GFR
BUN: 12 mg/dL (ref 6–23)
CO2: 26 meq/L (ref 19–32)
Calcium: 9.5 mg/dL (ref 8.4–10.5)
Chloride: 103 mEq/L (ref 96–112)
Creat: 0.74 mg/dL (ref 0.50–1.10)
GFR, Est African American: >89 mL/min
GFR, Est Non African American: 89 mL/min
GLUCOSE: 96 mg/dL (ref 70–99)
Potassium: 4.2 mEq/L (ref 3.5–5.3)
SODIUM: 138 meq/L (ref 135–145)

## 2014-02-27 LAB — TSH: TSH: 0.215 u[IU]/mL — ABNORMAL LOW (ref 0.350–4.500)

## 2014-02-27 MED ORDER — CARVEDILOL 6.25 MG PO TABS: ORAL_TABLET | ORAL | Status: DC

## 2014-02-27 NOTE — Progress Notes (Signed)
Subjective:    Patient ID: Stacy Mcintosh, female    DOB: 1956-04-22, 58 y.o.   MRN: [id number]  HPI  This 58 y.o. AA female is here for CPE; she has HTN and a chronic cardiomyopathy managed by her cardiologist. She also has hypothyroidism and a lipid disorder. She is compliant w/ medications; she reports some symptoms suggestive of inadequate hormone replacement. Pt is scheduled with specialists within next few weeks. Since last visit, pt has experienced several deaths in her family; she has some marital discord as well. Dr. Evelene Croon treats pt for chronic depression.   HCM: MMG- Current.           PAP- NA (s/p TAH).           CRS- Current (2011 w/ recall @ 10 years).           IMM- Pt declines vaccines.           Vision- Every 1-2 years.            Dental- Current.   Patient Active Problem List   Diagnosis Date Noted  . Depression 02/27/2014  . 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 PE (pulmonary embolism) 01/25/2011  . 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 OTHER AND UNSPECIFIED HYPERLIPIDEMIA 01/19/2010  . PURE HYPERCHOLESTEROLEMIA 11/18/2008  . CARDIOMYOPATHY 11/12/2008  . BUNDLE BRANCH BLOCK, LEFT 11/12/2008  . HYPERTENSION 10/16/2007  . HEADACHE, CHRONIC 10/16/2007    Prior to Admission medications   Medication Sig Start Date End Date Taking? Authorizing Provider  ALPRAZolam Prudy Feeler) 0.5 MG tablet Take 0.5 mg by mouth 3 (three) times daily as needed.     Yes Historical Provider, MD  aspirin 81 MG tablet Take 81 mg by mouth daily.     Yes Historical Provider, MD  clonazePAM (KLONOPIN) 1 MG tablet Take 1 mg by mouth daily as needed.  10/12/10  Yes Historical Provider, MD  cyclobenzaprine (FLEXERIL) 5 MG tablet Take 5 mg by mouth as needed.  10/25/10  Yes Historical Provider, MD  losartan (COZAAR) 50 MG tablet TAKE 1 TABLET BY MOUTH DAILY. 01/20/14  Yes Laurey Morale, MD  omeprazole (PRILOSEC) 20 MG capsule TAKE ONE CAPSULE BY MOUTH TWICE A DAY. 02/09/14  Yes Maurice March, MD  simvastatin (ZOCOR) 20 MG tablet TAKE 1 TABLET BY MOUTH AT BEDTIME. 01/20/14  Yes Laurey Morale, MD  thyroid [facility] THYROID) 90 MG tablet Take 1 tablet (90 mg total) by mouth daily.   Yes Maurice March, MD  traMADol (ULTRAM) 50 MG tablet Take 50 mg by mouth as needed.  10/25/10  Yes Historical Provider, MD  carvedilol (COREG) 6.25 MG tablet TAKE 1 TABLET BY MOUTH 2 (TWO) TIMES DAILY. 02/28/14   Pricilla Riffle, MD  nortriptyline (PAMELOR) 25 MG capsule  10/13/12   Historical Provider, MD  tobramycin (TOBREX) 0.3 % ophthalmic solution Place 2 drops into both eyes every 4 (four) hours. 11/03/13   Carmelina Dane, MD   History   Social History  . Marital Status: Married    Spouse Name: N/A    Number of Children: 3  . Years of Education: College   Occupational History  . 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 Smoking status: Never Smoker   . Smokeless tobacco: Never Used  . Alcohol Use: 0.0 oz/week    0 Not specified per week     Comment: rare 3  . Drug Use: No  . Sexual Activity: Not  on file   Other Topics Concern  . Not on file   Social History Narrative    Family History  Problem Relation Age of Onset  . Heart attack Mother   . Hypertension Mother   . Rheumatologic disease Mother   . Ovarian cancer Mother   . Prostate cancer Father   . Arthritis Sister   . Hypertension Sister   . Thyroid disease Brother     Parathyroid problem  . Diabetes Brother   . Heart attack Brother   . Sudden death Maternal Grandfather      Review of Systems  Constitutional: Positive for fatigue.  HENT: Positive for congestion and sinus pressure.   Eyes: Positive for visual disturbance.  Respiratory: Positive for chest tightness and shortness of breath.   Cardiovascular: Negative.   Gastrointestinal: Negative.   Endocrine: Positive for cold intolerance and heat intolerance.  Genitourinary: Negative.   Musculoskeletal: Positive for  myalgias, back pain, gait problem, neck pain and neck stiffness.       Pt would benefit from massage at her place of work but has no time to schedule a visit.  Skin: Negative.   Allergic/Immunologic: Negative.   Neurological: Positive for headaches.  Hematological: Negative.   Psychiatric/Behavioral: Positive for confusion, sleep disturbance, dysphoric mood and decreased concentration. The patient is nervous/anxious.       Objective:   Physical Exam  Constitutional: She is oriented to person, place, and time. Vital signs are normal. She appears well-developed and well-nourished. No distress.  HENT:  Head: Normocephalic and atraumatic.  Right Ear: Hearing, tympanic membrane, external ear and ear canal normal.  Left Ear: Hearing, tympanic membrane, external ear and ear canal normal.  Nose: Nose normal. No nasal deformity or septal deviation.  Mouth/Throat: Uvula is midline, oropharynx is clear and moist and mucous membranes are normal. No oral lesions. Normal dentition. No dental caries.  Eyes: Conjunctivae, EOM and lids are normal. Pupils are equal, round, and reactive to light. No scleral icterus.  Fundoscopic exam:      The right eye shows no papilledema. The right eye shows red reflex.       The left eye shows no papilledema. The left eye shows red reflex.  Fundoscopic exam difficult.  Neck: Trachea normal, normal range of motion, full passive range of motion without pain and phonation normal. Neck supple. No JVD present. No spinous process tenderness and no muscular tenderness present. No thyroid mass and no thyromegaly present.  Cardiovascular: Normal rate, regular rhythm, S1 normal, S2 normal, normal heart sounds and intact distal pulses.   No extrasystoles are present. PMI is not displaced.  Exam reveals no gallop and no friction rub.   No murmur heard. Pulmonary/Chest: Effort normal and breath sounds normal. No respiratory distress. She has no decreased breath sounds. She has no  wheezes. She has no rales. Right breast exhibits no inverted nipple, no mass, no nipple discharge, no skin change and no tenderness. Left breast exhibits no inverted nipple, no mass, no nipple discharge, no skin change and no tenderness. Breasts are symmetrical.  Abdominal: Soft. Normal appearance and bowel sounds are normal. She exhibits no distension, no abdominal bruit, no pulsatile midline mass and no mass. There is no hepatosplenomegaly. There is tenderness. There is no guarding and no CVA tenderness.  Genitourinary: Vagina normal. There is no rash, tenderness or lesion on the right labia. There is no rash, tenderness or lesion on the left labia. Right adnexum displays no mass, no tenderness and no  fullness. Left adnexum displays no mass, no tenderness and no fullness. No erythema, tenderness or bleeding in the vagina. No vaginal discharge found.  Absent cervix and uterus.  Musculoskeletal:       Cervical back: She exhibits tenderness and spasm. She exhibits normal range of motion, no bony tenderness, no swelling, no deformity and no pain.       Thoracic back: Normal.       Lumbar back: Normal.  Remainder of exam unremarkable.  Lymphadenopathy:       Head (right side): No submental, no submandibular, no tonsillar, no preauricular, no posterior auricular and no occipital adenopathy present.       Head (left side): No submental, no submandibular, no tonsillar, no preauricular, no posterior auricular and no occipital adenopathy present.    She has no cervical adenopathy.    She has no axillary adenopathy.       Right: No inguinal and no supraclavicular adenopathy present.       Left: No inguinal and no supraclavicular adenopathy present.  Neurological: She is alert and oriented to person, place, and time. She has normal strength and normal reflexes. She displays no atrophy. No cranial nerve deficit or sensory deficit. She exhibits normal muscle tone. She displays a negative Romberg sign.  Coordination and gait normal.  Skin: Skin is warm, dry and intact. No ecchymosis, no lesion and no rash noted. She is not diaphoretic. No cyanosis or erythema. Nails show no clubbing.  Psychiatric: Her speech is normal and behavior is normal. Judgment and thought content normal. Her affect is not angry, not labile and not inappropriate. Cognition and memory are normal. She exhibits a depressed mood.  PHQ-9 score= 21.  Nursing note and vitals reviewed.      Assessment & Plan:  Encounter for routine history and physical exam in female - Plan: IFOBT POC (occult bld, rslt in office), POCT urinalysis dipstick, CBC with Differential  Depression - Follow-up w/ Dr. Evelene CroonKaur; pt is in counseling as well and appears to be coping well with stressors. She states she does not do well on anti-depressants. Plan: TSH, BASIC METABOLIC PANEL WITH GFR  Pure hypercholesterolemia - Plan: Lipid panel  Postsurgical hypothyroidism - Pt currently taking Armour Thyroid and is stable. Plan: TSH

## 2014-02-27 NOTE — Patient Instructions (Signed)
Keeping You Healthy  Get These Tests  Blood Pressure- Have your blood pressure checked by your healthcare provider at least once a year.  Normal blood pressure is 120/80.  Weight- Have your body mass index (BMI) calculated to screen for obesity.  BMI is a measure of body fat based on height and weight.  You can calculate your own BMI at https://www.west-esparza.com/www.[email or web address]/bmi/  Cholesterol- Have your cholesterol checked every year.  Diabetes- Have your blood sugar checked every year if you have high blood pressure, high cholesterol, a family history of diabetes or if you are overweight.  Pap Smear- Have a pap smear every 1 to 3 years if you have been sexually active.  If you are older than 65 and recent pap smears have been normal you may not need additional pap smears.  In addition, if you have had a hysterectomy  For benign disease additional pap smears are not necessary.  Mammogram-Yearly mammograms are essential for early detection of breast cancer  Screening for Colon Cancer- Colonoscopy starting at age 350. Screening may begin sooner depending on your family history and other health conditions.  Follow up colonoscopy as directed by your Gastroenterologist.  Screening for Osteoporosis- Screening begins at age 59 with bone density scanning, sooner if you are at higher risk for developing Osteoporosis.  Get these medicines  Calcium with Vitamin D- Your body requires 1200-1500 mg of Calcium a day and [phone] IU of Vitamin D a day.  You can only absorb 500 mg of Calcium at a time therefore Calcium must be taken in 2 or 3 separate doses throughout the day.  Hormones- Hormone therapy has been associated with increased risk for certain cancers and heart disease.  Talk to your healthcare provider about if you need relief from menopausal symptoms.  Aspirin- Ask your healthcare provider about taking Aspirin to prevent Heart Disease and Stroke.  Get these Immuniztions  Flu shot- Every fall. You have  declined this vaccine.  Pneumonia shot- Once after the age of 59; if you are younger ask your healthcare provider if you need a pneumonia shot.  Tetanus- Every ten years.  Zostavax- Once after the age of 59 to prevent shingles.  Take these steps  Don't smoke- Your healthcare provider can help you quit. For tips on how to quit, ask your healthcare provider or go to www.smokefree.gov or call 1-800 QUIT-NOW.  Be physically active- Exercise 5 days a week for a minimum of 30 minutes.  If you are not already physically active, start slow and gradually work up to 30 minutes of moderate physical activity.  Try walking, dancing, bike riding, swimming, etc.  Eat a healthy diet- Eat a variety of healthy foods such as fruits, vegetables, whole grains, low fat milk, low fat cheeses, yogurt, lean meats, chicken, fish, eggs, dried beans, tofu, etc.  For more information go to www.thenutritionsource.org  Dental visit- Brush and floss teeth twice daily; visit your dentist twice a year.  Eye exam- Visit your Optometrist or Ophthalmologist yearly.  Drink alcohol in moderation- Limit alcohol intake to one drink or less a day.  Never drink and drive.  Depression- Your emotional health is as important as your physical health.  If you're feeling down or losing interest in things you normally enjoy, please talk to your healthcare provider.  Seat Belts- can save your life; always wear one  Smoke/Carbon Monoxide detectors- These detectors need to be installed on the appropriate level of your home.  Replace batteries at least once a  year.  Violence- If anyone is threatening or hurting you, please tell your healthcare provider.  Living Will/ Health care power of attorney- Discuss with your healthcare provider and family.        Mediterranean Diet  Why follow it? Research shows. . Those who follow the Mediterranean diet have a reduced risk of heart disease  . The diet is associated with a reduced incidence  of Parkinson's and Alzheimer's diseases . People following the diet may have longer life expectancies and lower rates of chronic diseases  . The Dietary Guidelines for Americans recommends the Mediterranean diet as an eating plan to promote health and prevent disease  What Is the Mediterranean Diet?  . Healthy eating plan based on typical foods and recipes of Mediterranean-style cooking . The diet is primarily a plant based diet; these foods should make up a majority of meals   Starches - Plant based foods should make up a majority of meals - They are an important sources of vitamins, minerals, energy, antioxidants, and fiber - Choose whole grains, foods high in fiber and minimally processed items  - Typical grain sources include wheat, oats, barley, corn, brown rice, bulgar, farro, millet, polenta, couscous  - Various types of beans include chickpeas, lentils, fava beans, black beans, white beans   Fruits  Veggies - Large quantities of antioxidant rich fruits & veggies; 6 or more servings  - Vegetables can be eaten raw or lightly drizzled with oil and cooked  - Vegetables common to the traditional Mediterranean Diet include: artichokes, arugula, beets, broccoli, brussel sprouts, cabbage, carrots, celery, collard greens, cucumbers, eggplant, kale, leeks, lemons, lettuce, mushrooms, okra, onions, peas, peppers, potatoes, pumpkin, radishes, rutabaga, shallots, spinach, sweet potatoes, turnips, zucchini - Fruits common to the Mediterranean Diet include: apples, apricots, avocados, cherries, clementines, dates, figs, grapefruits, grapes, melons, nectarines, oranges, peaches, pears, pomegranates, strawberries, tangerines  Fats - Replace butter and margarine with healthy oils, such as olive oil, canola oil, and tahini  - Limit nuts to no more than a handful a day  - Nuts include walnuts, almonds, pecans, pistachios, pine nuts  - Limit or avoid candied, honey roasted or heavily salted nuts - Olives are  central to the [employer] - can be eaten whole or used in a variety of dishes   Meats Protein - Limiting red meat: no more than a few times a month - When eating red meat: choose lean cuts and keep the portion to the size of deck of cards - Eggs: approx. 0 to 4 times a week  - Fish and lean poultry: at least 2 a week  - Healthy protein sources include, chicken, [place], lean beef, lamb - Increase intake of seafood such as tuna, salmon, trout, mackerel, shrimp, scallops - Avoid or limit high fat processed meats such as sausage and bacon  Dairy - Include moderate amounts of low fat dairy products  - Focus on healthy dairy such as fat free yogurt, skim milk, low or reduced fat cheese - Limit dairy products higher in fat such as whole or 2% milk, cheese, ice cream  Alcohol - Moderate amounts of red wine is ok  - No more than 5 oz daily for women (all ages) and men older than age 23  - No more than 10 oz of wine daily for men younger than 3  Other - Limit sweets and other desserts  - Use herbs and spices instead of salt to flavor foods  - Herbs and spices common to  the traditional Mediterranean Diet include: basil, bay leaves, chives, cloves, cumin, fennel, garlic, lavender, marjoram, mint, oregano, parsley, pepper, rosemary, sage, savory, sumac, tarragon, thyme   It's not just a diet, it's a lifestyle:  . The Mediterranean diet includes lifestyle factors typical of those in the region  . Foods, drinks and meals are best eaten with others and savored . Daily physical activity is important for overall good health . This could be strenuous exercise like running and aerobics . This could also be more leisurely activities such as walking, housework, yard-work, or taking the stairs . Moderation is the key; a balanced and healthy diet accommodates most foods and drinks . Consider portion sizes and frequency of consumption of certain foods   Meal Ideas & Options:  . Breakfast:  o Whole wheat  toast or whole wheat English muffins with peanut butter & hard boiled egg o Steel cut oats topped with apples & cinnamon and skim milk  o Fresh fruit: banana, strawberries, melon, berries, peaches  o Smoothies: strawberries, bananas, greek yogurt, peanut butter o Low fat greek yogurt with blueberries and granola  o Egg white omelet with spinach and mushrooms o Breakfast couscous: whole wheat couscous, apricots, skim milk, cranberries  . Sandwiches:  o Hummus and grilled vegetables (peppers, zucchini, squash) on whole wheat bread   o Grilled chicken on whole wheat pita with lettuce, tomatoes, cucumbers or tzatziki  o Tuna salad on whole wheat bread: tuna salad made with greek yogurt, olives, red peppers, capers, green onions o Garlic rosemary lamb pita: lamb sauted with garlic, rosemary, salt & pepper; add lettuce, cucumber, greek yogurt to pita - flavor with lemon juice and black pepper  . Seafood:  o Mediterranean grilled salmon, seasoned with garlic, basil, parsley, lemon juice and black pepper o Shrimp, lemon, and spinach whole-grain pasta salad made with low fat greek yogurt  o Seared scallops with lemon orzo  o Seared tuna steaks seasoned salt, pepper, coriander topped with tomato mixture of olives, tomatoes, olive oil, minced garlic, parsley, green onions and cappers  . Meats:  o Herbed greek chicken salad with kalamata olives, cucumber, feta  o Red bell peppers stuffed with spinach, bulgur, lean ground beef (or lentils) & topped with feta   o Kebabs: skewers of chicken, tomatoes, onions, zucchini, squash  o [place] burgers: made with red onions, mint, dill, lemon juice, feta cheese topped with roasted red peppers . Vegetarian o Cucumber salad: cucumbers, artichoke hearts, celery, red onion, feta cheese, tossed in olive oil & lemon juice  o Hummus and whole grain pita points with a greek salad (lettuce, tomato, feta, olives, cucumbers, red onion) o Lentil soup with celery, carrots made  with vegetable broth, garlic, salt and pepper  o Tabouli salad: parsley, bulgur, mint, scallions, cucumbers, tomato, radishes, lemon juice, olive oil, salt and pepper. o

## 2014-02-28 ENCOUNTER — Other Ambulatory Visit: Payer: Self-pay | Admitting: *Deleted

## 2014-02-28 ENCOUNTER — Encounter: Payer: Self-pay | Admitting: *Deleted

## 2014-03-01 ENCOUNTER — Telehealth: Payer: Self-pay | Admitting: Family Medicine

## 2014-03-01 NOTE — Telephone Encounter (Signed)
I phoned pt to discuss some of her lab results with her. CBC was clearly abnormal and TSH indicates that Armour thyroid medication dose reduction is necessary. Her results will be released on MyChart and I will advise pt to schedule follow-up OV to discuss abnormal labs and to repeat some labs.

## 2014-03-03 ENCOUNTER — Ambulatory Visit (INDEPENDENT_AMBULATORY_CARE_PROVIDER_SITE_OTHER): Payer: 59 | Admitting: Cardiology

## 2014-03-03 ENCOUNTER — Encounter: Payer: Self-pay | Admitting: *Deleted

## 2014-03-03 ENCOUNTER — Telehealth: Payer: Self-pay | Admitting: Radiology

## 2014-03-03 ENCOUNTER — Encounter: Payer: Self-pay | Admitting: Cardiology

## 2014-03-03 ENCOUNTER — Other Ambulatory Visit: Payer: 59

## 2014-03-03 VITALS — BP 126/80 | HR 85 | Ht 60.0 in | Wt 166.8 lb

## 2014-03-03 DIAGNOSIS — I429 Cardiomyopathy, unspecified: Secondary | ICD-10-CM

## 2014-03-03 DIAGNOSIS — I447 Left bundle-branch block, unspecified: Secondary | ICD-10-CM

## 2014-03-03 DIAGNOSIS — I428 Other cardiomyopathies: Secondary | ICD-10-CM

## 2014-03-03 DIAGNOSIS — I10 Essential (primary) hypertension: Secondary | ICD-10-CM

## 2014-03-03 DIAGNOSIS — I2699 Other pulmonary embolism without acute cor pulmonale: Secondary | ICD-10-CM

## 2014-03-03 NOTE — Telephone Encounter (Signed)
Patient has walked in to get her forms.

## 2014-03-03 NOTE — Patient Instructions (Signed)
Your physician has requested that you have an echocardiogram. Echocardiography is a painless test that uses sound waves to create images of your heart. It provides your doctor with information about the size and shape of your heart and how well your heart's chambers and valves are working. This procedure takes approximately one hour. There are no restrictions for this procedure.  Your physician wants you to follow-up in: 1 year with Dr McLean. (November 2016).  You will receive a reminder letter in the mail two months in advance. If you don't receive a letter, please call our office to schedule the follow-up appointment.   

## 2014-03-04 DIAGNOSIS — I428 Other cardiomyopathies: Secondary | ICD-10-CM | POA: Insufficient documentation

## 2014-03-04 NOTE — Progress Notes (Signed)
Patient ID: Stacy Mcintosh, female   DOB: 1955/05/05, 58 y.o.   MRN: [id number] PCP: Dr Audria NineMcPherson  58 yo with history of hypothyroidism, nonischemic cardiomyopathy, and PE presents for cardiology followup.  Last echo in 2010 showed recovered LV systolic function.  Hypothyroidism has been controlled by Armour thyroid.  She is now seeing Dr. Lucianne MussKumar for endocrine management. From a cardiac standpoint, she has been doing well symptomatically with no exertional dyspnea or chest pain. She has been under stress at home and has had low back pain, which has led to her being less active overall.  No lightheadedness or syncope.   ECG: NSR, LBBB  Labs (10/10): BNP 13, K 3.7, creatinine 0.8 Labs (7/11): K 4.1, creatinine 0.6 Labs (7/12): LDL 143, HDL 74, K 3.3, creatinine 1.0, TSH normal Labs (12/12): K 4, creatinine 0.8, HDL 85, LDL 107 Labs (11/15): K 4.2, creatinine 0.74, LDL 78, HDL 76  Allergies (verified):  1)  ! Lisinopril  Past Medical History: 1. Hypothyroidism and the patient has history of Graves disease.  She is status post radioactive iodine therapy.  She then developed hypothyroidism, and the hypothyroidism has been difficult to control.   2. Cardiomyopathy, nonischemic.  The patient had a cardiac MRI done during her hospitalization in July 2009.  This showed moderate LV enlargement and global LV hypokinesis.  There was a septal paradpattern.  EF was calculated to be 27%.  There was mild left atrial enlargement.  The RV was normal in size and function.  There was no ASD or VSD.  There was no evidence for delayed enhancement pattern  in the LV myocardium suggesting that there was no scar from coronary artery disease and no definite evidence for infiltrative cardiomyopathy.  HIV was negative.  She did have an echo done also in the hospital in July 2009 that showed severe mitral regurgitation.  A TEE done following this showed an EF of 25-30% with severe mitral regurgitation.  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  The patient did have a left and right heart catheterization after treatment for congestive heart failure in January 2010.  There was no angiographic coronary disease.  EF had improved to 55-60% with no significant mitral  regurgitation.  The hemodynamics were totally normal on the rightheart catheterization.  The cardiomyopathy, I think, was likely du eto poorly controlled hypothyroidism.  Echo (8/10): EF 50-55%, mild LV dilatation, mild-mod MR, PASP 32 3. PE/DVT.  July 2009.  The patient had been very sedentary prior to this due to her severe hypothyroidism.  She had  normal antithrombin III.  She was negative for factor V Leiden.  She had normal protein C and protein S function.  Now off coumadin.  4. Hypertension. 5. History of left bundle-branch block. 6. Asthma. 7. Migraines. 8. ACEI cough 9. Low back pain: L-spine disease.   Family History: Mother-living; heart attack, HTN, Rheumatism Father-deceased age 59; prostate cancer Brother-living, parathyroid problem Sister- living; arthiritis Brother- living; premature heart attack x 2; DM-insulin Niece- blood clot in groin  Social History: Married with 3 children  Teacher, adult educationMassage Therapist. Never smoked ETOH-no   ROS: All systems reviewed and negative except as per HPI.   Current Outpatient Prescriptions  Medication Sig Dispense Refill  . ALPRAZolam (XANAX) 0.5 MG tablet Take 0.5 mg by mouth 3 (three) times daily as needed.      Marland Kitchen. aspirin 81 MG tablet Take 81 mg by mouth daily.      . carvedilol (  COREG) 6.25 MG tablet TAKE 1 TABLET BY MOUTH 2 (TWO) TIMES DAILY. 60 tablet 6  . clonazePAM (KLONOPIN) 1 MG tablet Take 1 mg by mouth daily as needed.     . cyclobenzaprine (FLEXERIL) 10 MG tablet Take 10 mg by mouth as needed.    Marland Kitchen losartan (COZAAR) 50 MG tablet TAKE 1 TABLET BY MOUTH DAILY. 30 tablet 1  . omeprazole (PRILOSEC) 20 MG capsule TAKE ONE CAPSULE BY MOUTH TWICE A DAY. 30 capsule 5  . simvastatin  (ZOCOR) 20 MG tablet TAKE 1 TABLET BY MOUTH AT BEDTIME. 30 tablet 1  . thyroid (ARMOUR THYROID) 90 MG tablet Take 1 tablet (90 mg total) by mouth daily. 30 tablet 3  . traMADol (ULTRAM) 50 MG tablet Take 50 mg by mouth as needed.      No current facility-administered medications for this visit.    BP 126/80 mmHg  Pulse 85  Ht 5' (1.524 m)  Wt 166 lb 12.8 oz (75.66 kg)  BMI 32.58 kg/m2 General: NAD, overweight Neck: No JVD, no thyromegaly or thyroid nodule.  Lungs: Clear to auscultation bilaterally with normal respiratory effort. CV: Nondisplaced PMI.  Heart regular S1/S2 with paradoxical S2 split, no S3/S4, no murmur.  No peripheral edema.  No carotid bruit.  Normal pedal pulses.  Abdomen: Soft, nontender, no hepatosplenomegaly, no distention.  Neurologic: Alert and oriented x 3.  Psych: Normal affect. Extremities: No clubbing or cyanosis.   Assessment/Plan:  CARDIOMYOPATHY  Patient had a nonischemic cardiomyopathy that may have been due to uncontrolled hypothyroidism. EF improved to 50-55% on echo in 2010.  Hypothyroidism is being managed by an endocrinologist.  I will have her continue losartan and Coreg. I am going to get an echo for repeat evaluation of LV systolic function. Hypothyroidism  Follows with endocrinology.  HYPERCHOLESTEROLEMIA Good lipids on simvastatin.  PE (pulmonary embolism)   History of PE. No longer taking coumadin. She will continue ASA 81 mg daily.   Followup in 1 year.   Marca Ancona 03/04/2014

## 2014-03-10 ENCOUNTER — Other Ambulatory Visit (HOSPITAL_COMMUNITY): Payer: 59

## 2014-03-13 ENCOUNTER — Ambulatory Visit (HOSPITAL_COMMUNITY): Payer: 59 | Attending: Cardiovascular Disease

## 2014-03-13 DIAGNOSIS — I483 Typical atrial flutter: Secondary | ICD-10-CM

## 2014-03-13 DIAGNOSIS — I429 Cardiomyopathy, unspecified: Secondary | ICD-10-CM | POA: Diagnosis not present

## 2014-03-13 DIAGNOSIS — R51 Headache: Secondary | ICD-10-CM | POA: Insufficient documentation

## 2014-03-13 DIAGNOSIS — I10 Essential (primary) hypertension: Secondary | ICD-10-CM | POA: Insufficient documentation

## 2014-03-13 DIAGNOSIS — E785 Hyperlipidemia, unspecified: Secondary | ICD-10-CM | POA: Insufficient documentation

## 2014-03-13 DIAGNOSIS — I428 Other cardiomyopathies: Secondary | ICD-10-CM

## 2014-03-13 NOTE — Progress Notes (Signed)
2D Echo completed. 03/13/2014 

## 2014-03-22 ENCOUNTER — Other Ambulatory Visit: Payer: Self-pay | Admitting: Cardiology

## 2014-03-23 ENCOUNTER — Other Ambulatory Visit: Payer: Self-pay | Admitting: Cardiology

## 2014-04-14 ENCOUNTER — Telehealth (HOSPITAL_COMMUNITY): Payer: Self-pay | Admitting: Vascular Surgery

## 2014-04-14 ENCOUNTER — Other Ambulatory Visit: Payer: Self-pay | Admitting: Family Medicine

## 2014-04-14 NOTE — Telephone Encounter (Signed)
Pt cancelled appt she had scheduled in [facility] 04[facility]21/14 to discuss echo results. She is upset because of the cost of the echocardiogram. Pt states she cannot afford to come for office visit to discuss echo results.  She is asking for Dr Shirlee Latch to call her and discuss echo results over the telephone.  She is aware that he is out of the office this week.

## 2014-04-14 NOTE — Telephone Encounter (Signed)
Pt called to cancel appt.. She will not see Mclean over here she cant afford to see him here... Dr. Shirlee Latch can call her or send her letter for her Echo results

## 2014-04-14 NOTE — Telephone Encounter (Signed)
Pt does not want to come to CHF clinic due to cost, will send to [facility] and Dr Shirlee Latch to address

## 2014-04-15 ENCOUNTER — Telehealth: Payer: Self-pay

## 2014-04-15 MED ORDER — THYROID 15 MG PO TABS: 15.0000 mg | ORAL_TABLET | Freq: Every day | ORAL | Status: DC

## 2014-04-15 MED ORDER — THYROID 65 MG PO TABS: 65.0000 mg | ORAL_TABLET | Freq: Every day | ORAL | Status: DC

## 2014-04-15 MED ORDER — THYROID 60 MG PO TABS: 60.0000 mg | ORAL_TABLET | Freq: Every day | ORAL | Status: DC

## 2014-04-15 NOTE — Telephone Encounter (Signed)
Dr. Audria Nine, [facility] called to let you know that Armor Thyroid does not come in . I authorized for them to do 60 instead. It comes in increments of 15. If this is not ok, let me know and I will call them back.

## 2014-04-15 NOTE — Telephone Encounter (Signed)
Armour thyroid dose reduced to 65 mg 1 tablet daily.

## 2014-04-15 NOTE — Telephone Encounter (Signed)
LMOM to CB concerning med. On TSH results done in November, Dr Audria Nine wrote pt that her thyroid med needs to be reduced and that she should sch appt to come see Dr Audria Nine to discuss the results in the next 2-3 wks. Ask pt if she got the results/message through MyChart and when she can come for f/up.

## 2014-04-15 NOTE — Telephone Encounter (Signed)
I have ordered Armour 60 mg + Armour 15 mg; pt to take 1 of each daily for total dose = 75 mg.  Thanks.

## 2014-04-15 NOTE — Telephone Encounter (Signed)
EF low again.  Does she need to be seen back at [address]? Will need to adjust her medications and probably needs a regular office appt.  If she does not want to do that, can try to arrange things over phone.

## 2014-04-15 NOTE — Telephone Encounter (Signed)
Pt CB and she reported that the message she had gotten about lab results in Nov was unclear and she wasn't sure if and why she was supposed to RTC. Pt stated that she is "drowning in medical bills" and can not come in to discuss lowering her Armour dosage. She would like for Dr Audria Nine to decrease strength if that is what lab indicates. She wants to remind Dr Audria Nine that she can not take any other thyroid medication besides the Armour, it was ineffective and she had SEs/complications when they were tried. Please advise/change med as needed w/ regard to RF request from pharm.

## 2014-04-15 NOTE — Telephone Encounter (Signed)
She wants to speak with Dr Shirlee Latch. She states she does not have the money to come to the office for an office visit.

## 2014-04-21 ENCOUNTER — Encounter (HOSPITAL_COMMUNITY): Payer: 59

## 2014-05-04 ENCOUNTER — Telehealth: Payer: Self-pay

## 2014-05-04 NOTE — Telephone Encounter (Signed)
Pt states ins will cover prilosec 40 mg a day but NOT what was written by Dr. Audria Nine. [facility] [address] Pt CB # [phone] or [phone]

## 2014-05-05 MED ORDER — OMEPRAZOLE 40 MG PO CPDR: 40.0000 mg | DELAYED_RELEASE_CAPSULE | Freq: Every day | ORAL | Status: DC

## 2014-05-05 NOTE — Telephone Encounter (Signed)
Please advise if ok to change omeprazole to 40mg  QD instead of 20mg  BID.

## 2014-05-05 NOTE — Telephone Encounter (Signed)
Omeprazole changed to 40 mg daily. Please advise pt. Thanks.

## 2014-05-06 NOTE — Telephone Encounter (Signed)
Pt advised.

## 2014-07-02 ENCOUNTER — Other Ambulatory Visit: Payer: Self-pay

## 2014-07-02 MED ORDER — LOSARTAN POTASSIUM 50 MG PO TABS
50.0000 mg | ORAL_TABLET | Freq: Every day | ORAL | Status: DC
Start: 2014-07-02 — End: 2014-12-31

## 2014-07-07 ENCOUNTER — Other Ambulatory Visit: Payer: Self-pay

## 2014-07-07 MED ORDER — CARVEDILOL 6.25 MG PO TABS: ORAL_TABLET | ORAL | Status: DC

## 2014-07-07 MED ORDER — SIMVASTATIN 20 MG PO TABS
20.0000 mg | ORAL_TABLET | Freq: Every day | ORAL | Status: DC
Start: 2014-07-07 — End: 2015-08-13

## 2014-07-09 ENCOUNTER — Other Ambulatory Visit: Payer: Self-pay

## 2014-07-09 MED ORDER — OMEPRAZOLE 40 MG PO CPDR: 40.0000 mg | DELAYED_RELEASE_CAPSULE | Freq: Every day | ORAL | Status: DC

## 2014-07-22 ENCOUNTER — Other Ambulatory Visit: Payer: Self-pay | Admitting: *Deleted

## 2014-07-22 MED ORDER — CARVEDILOL 6.25 MG PO TABS: ORAL_TABLET | ORAL | Status: DC

## 2014-07-24 ENCOUNTER — Other Ambulatory Visit: Payer: Self-pay

## 2014-07-24 MED ORDER — THYROID 15 MG PO TABS: 15.0000 mg | ORAL_TABLET | Freq: Every day | ORAL | Status: DC

## 2014-07-31 ENCOUNTER — Other Ambulatory Visit: Payer: Self-pay

## 2014-07-31 MED ORDER — THYROID 60 MG PO TABS
60.0000 mg | ORAL_TABLET | Freq: Every day | ORAL | Status: DC
Start: 2014-07-31 — End: 2014-08-31

## 2014-08-28 ENCOUNTER — Ambulatory Visit (INDEPENDENT_AMBULATORY_CARE_PROVIDER_SITE_OTHER): Payer: Managed Care, Other (non HMO) | Admitting: Family Medicine

## 2014-08-28 ENCOUNTER — Encounter: Payer: Self-pay | Admitting: Family Medicine

## 2014-08-28 VITALS — BP 150/80 | HR 95 | Temp 97.6°F | Resp 16 | Ht 60.0 in | Wt 167.6 lb

## 2014-08-28 DIAGNOSIS — E559 Vitamin D deficiency, unspecified: Secondary | ICD-10-CM | POA: Diagnosis not present

## 2014-08-28 DIAGNOSIS — R7989 Other specified abnormal findings of blood chemistry: Secondary | ICD-10-CM | POA: Diagnosis not present

## 2014-08-28 DIAGNOSIS — E890 Postprocedural hypothyroidism: Secondary | ICD-10-CM | POA: Diagnosis not present

## 2014-08-28 DIAGNOSIS — I10 Essential (primary) hypertension: Secondary | ICD-10-CM

## 2014-08-29 LAB — COMPLETE METABOLIC PANEL WITH GFR
ALT: 18 U/L (ref 0–35)
AST: 19 U/L (ref 0–37)
Albumin: 4.2 g/dL (ref 3.5–5.2)
Alkaline Phosphatase: 66 U/L (ref 39–117)
BUN: 15 mg/dL (ref 6–23)
CO2: 29 mEq/L (ref 19–32)
Calcium: 9.9 mg/dL (ref 8.4–10.5)
Chloride: 103 mEq/L (ref 96–112)
Creat: 1 mg/dL (ref 0.50–1.10)
GFR, EST AFRICAN AMERICAN: 71 mL/min
GFR, EST NON AFRICAN AMERICAN: 62 mL/min
GLUCOSE: 96 mg/dL (ref 70–99)
POTASSIUM: 4 meq/L (ref 3.5–5.3)
Sodium: 139 mEq/L (ref 135–145)
Total Bilirubin: 0.3 mg/dL (ref 0.2–1.2)
Total Protein: 7.3 g/dL (ref 6.0–8.3)

## 2014-08-29 LAB — CBC WITH DIFFERENTIAL/PLATELET
BASOS PCT: 0 % (ref 0–1)
Basophils Absolute: 0 10*3/uL (ref 0.0–0.1)
Eosinophils Absolute: 0.2 10*3/uL (ref 0.0–0.7)
Eosinophils Relative: 3 % (ref 0–5)
HEMATOCRIT: 40.4 % (ref 36.0–46.0)
Hemoglobin: 13.7 g/dL (ref 12.0–15.0)
Lymphocytes Relative: 42 % (ref 12–46)
Lymphs Abs: 3.5 10*3/uL (ref 0.7–4.0)
MCH: 30.5 pg (ref 26.0–34.0)
MCHC: 33.9 g/dL (ref 30.0–36.0)
MCV: 90 fL (ref 78.0–100.0)
MONO ABS: 0.7 10*3/uL (ref 0.1–1.0)
MPV: 10.3 fL (ref 8.6–12.4)
Monocytes Relative: 8 % (ref 3–12)
Neutro Abs: 3.9 10*3/uL (ref 1.7–7.7)
Neutrophils Relative %: 47 % (ref 43–77)
Platelets: 395 10*3/uL (ref 150–400)
RBC: 4.49 MIL/uL (ref 3.87–5.11)
RDW: 15 % (ref 11.5–15.5)
WBC: 8.3 10*3/uL (ref 4.0–10.5)

## 2014-08-29 LAB — THYROID PANEL WITH TSH
FREE THYROXINE INDEX: 1.7 (ref 1.4–3.8)
T3 Uptake: 27 % (ref 22–35)
T4, Total: 6.2 ug/dL (ref 4.5–12.0)
TSH: 1.972 u[IU]/mL (ref 0.350–4.500)

## 2014-08-30 LAB — VITAMIN D 25 HYDROXY (VIT D DEFICIENCY, FRACTURES): VIT D 25 HYDROXY: 26 ng/mL — AB (ref 30–100)

## 2014-08-31 ENCOUNTER — Other Ambulatory Visit: Payer: Self-pay | Admitting: Family Medicine

## 2014-08-31 MED ORDER — THYROID 15 MG PO TABS
15.0000 mg | ORAL_TABLET | Freq: Every day | ORAL | Status: DC
Start: 2014-08-31 — End: 2015-04-20

## 2014-08-31 MED ORDER — THYROID 60 MG PO TABS: 60.0000 mg | ORAL_TABLET | Freq: Every day | ORAL | Status: DC

## 2014-08-31 NOTE — Progress Notes (Signed)
Subjective:    Patient ID: Stacy Mcintosh, female    DOB: 18-Feb-1956, 59 y.o.   MRN: [id number]  HPI This 59 y.o female has hypothyroidism, s/p treatment for hyperthyroidism w/ PTU followed by radioactive iodine ablation. She c/o fatigue despite taking Armour Thyroid as directed. Pt reports inability to tolerate Synthroid or levothyroxine.  She admits that fatigue may be psychological, related to marital difficulties (husband- hx of infidelity and alcohol dependence). Pt in treatment w/ psychiatrist, Dr. Evelene Croon.  Pt has abnormal CBC w/ elevated WBCs in Nov 2015. She denies fever, anorexia, abnormal weight loss, abnormal rashes or skin changes, night sweats, myalgias or enlarged lymph nodes.  HTN w/ non-ischemic cardiomyopathy and LBBB- Routine follow-up w/ Dr. Marca Ancona. Pt had normal reading at last OV at Cardiology OV and here in Nov 2015. She is compliant w/ medications and reports no adverse effects. BP monitoring elsewhere >> normal readings.   Pt denies vision disturbance, CP or tightness, palpitations, SOB or DOE, edema, cough, dizziness, lightheadedness, numbness, weakness or syncope. She has occasional HA but attributes this to stress.    Patient Active Problem List   Diagnosis Date Noted  . Nonischemic cardiomyopathy 03/04/2014  . Depression 02/27/2014  . 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 PE (pulmonary embolism) 01/25/2011  . 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 OTHER AND UNSPECIFIED HYPERLIPIDEMIA 01/19/2010  . PURE HYPERCHOLESTEROLEMIA 11/18/2008  . CARDIOMYOPATHY 11/12/2008  . LBBB (left bundle branch block) 11/12/2008  . Essential hypertension 10/16/2007  . HEADACHE, CHRONIC 10/16/2007    Prior to Admission medications   Medication Sig Start Date End Date Taking? Authorizing Provider  ALPRAZolam Prudy Feeler) 0.5 MG tablet Take 0.5 mg by mouth 3 (three) times daily as needed.     Yes Historical Provider, MD  aspirin 81 MG tablet Take 81 mg by mouth daily.      Yes Historical Provider, MD  carvedilol (COREG) 6.25 MG tablet TAKE 1 TABLET BY MOUTH 2 (TWO) TIMES DAILY. 07/22/14  Yes Pricilla Riffle, MD  clonazePAM (KLONOPIN) 1 MG tablet Take 1 mg by mouth daily as needed.  10/12/10  Yes Historical Provider, MD  cyclobenzaprine (FLEXERIL) 10 MG tablet Take 10 mg by mouth as needed. 02/25/14  Yes Historical Provider, MD  losartan (COZAAR) 50 MG tablet Take 1 tablet (50 mg total) by mouth daily. 07/02/14  Yes Laurey Morale, MD  omeprazole (PRILOSEC) 40 MG capsule Take 1 capsule (40 mg total) by mouth daily. 07/09/14  Yes Maurice March, MD  simvastatin (ZOCOR) 20 MG tablet Take 1 tablet (20 mg total) by mouth at bedtime. 07/07/14  Yes Laurey Morale, MD  thyroid [facility] THYROID) 15 MG tablet Take 1 tablet (15 mg total) by mouth daily.    Maurice March, MD  thyroid [facility] THYROID) 60 MG tablet Take 1 tablet (60 mg total) by mouth daily before breakfast.    Maurice March, MD    Past Surgical History  Procedure Laterality Date  . Abdominal hysterectomy    . Cesarean section      x3    History   Social History  . Marital Status: Married    Spouse Name: N/A  . Number of Children: 3  . Years of Education: College   Occupational History  . Massage Therapist    Social History Main Topics  . Smoking status: Never Smoker   . Smokeless tobacco: Never Used  . Alcohol Use: 0.0 oz/week    0 Standard drinks or equivalent per  week     Comment: rare 3  . Drug Use: No  . Sexual Activity: Not on file   Other Topics Concern  . Not on file   Social History Narrative    Family History  Problem Relation Age of Onset  . Heart attack Mother   . Hypertension Mother   . Rheumatologic disease Mother   . Ovarian cancer Mother   . Prostate cancer Father   . Arthritis Sister   . Hypertension Sister   . Thyroid disease Brother     Parathyroid problem  . Diabetes Brother   . Heart attack Brother   . Sudden death Maternal Grandfather      Review of Systems As per HPI.     Objective:   Physical Exam  Constitutional: She is oriented to person, place, and time. She appears well-developed and well-nourished. No distress.  Blood pressure 150/80, pulse 95, temperature 97.6 F (36.4 C), temperature source Oral, resp. rate 16, height 5' (1.524 m), weight 167 lb 9.6 oz (76.023 kg), SpO2 97 %.   HENT:  Head: Normocephalic and atraumatic.  Right Ear: External ear normal.  Left Ear: External ear normal.  Nose: Nose normal.  Mouth/Throat: Oropharynx is clear and moist.  Eyes: Conjunctivae and EOM are normal. No scleral icterus.  Neck: Normal range of motion. Neck supple. No thyromegaly present.  Cardiovascular: Normal rate, regular rhythm and normal heart sounds.   No murmur heard. Pulmonary/Chest: Effort normal and breath sounds normal. No respiratory distress.  Musculoskeletal: Normal range of motion. She exhibits no edema or tenderness.  Neurological: She is alert and oriented to person, place, and time. She has normal reflexes. No cranial nerve deficit. She exhibits normal muscle tone. Coordination normal.  Skin: Skin is warm and dry. She is not diaphoretic.  Psychiatric: Her behavior is normal. Judgment and thought content normal.  PHQ-9 score= 14. Flat affect and mood is depressed. Good eye contact. Speech is conversational and appropriate and content is normal. Pt has no thought of self-harm, SI/HI. She voices resignation regarding need to move forward w/ separation from husband.  Nursing note and vitals reviewed.      Assessment & Plan:  Hypothyroidism, postablative - Continue current doses of Armour Thyroid pending ENDOCRINE eval. Plan: Thyroid Panel With TSH, Ambulatory referral to Endocrinology  Abnormal CBC - Nov 2015 >> WBC= 20.7 w/ 87% neutrophils, plts= 541 K. Plan: CBC with Differential/Platelet  Vitamin D deficiency - Plan: Vitamin D, 25-hydroxy  Essential hypertension - Continue current medications;  monitor BP at home and elsewhere. Plan: COMPLETE METABOLIC PANEL WITH GFR

## 2014-08-31 NOTE — Progress Notes (Signed)
Quick Note:  Please mail pt a copy of labs; her MyChart account has a problem.  All labs are normal. Thyroid function test indicates your are taking the right amount of medication. I will refill that medication with the same dose until you are evaluated by the Endocrine specialist.   Vitamin D is a little below normal. Get an over-the-counter Vitamin D3 2000 supplement and take it daily. Try to eat more Vitamin D-rich foods (salmon, sardines and other cold-water fish, mushrooms, eggs and some dairy products). Try to get some sun exposure more 10-15 minutes most days of the week; this will improve your mood also.  Copy to pt.   ______

## 2014-09-01 ENCOUNTER — Encounter: Payer: Self-pay | Admitting: Radiology

## 2014-10-28 ENCOUNTER — Other Ambulatory Visit: Payer: Self-pay | Admitting: Family Medicine

## 2014-11-04 ENCOUNTER — Ambulatory Visit: Payer: Self-pay | Admitting: Internal Medicine

## 2014-11-06 ENCOUNTER — Ambulatory Visit: Payer: Self-pay | Admitting: Internal Medicine

## 2014-11-15 ENCOUNTER — Other Ambulatory Visit: Payer: Self-pay | Admitting: Internal Medicine

## 2014-11-18 ENCOUNTER — Other Ambulatory Visit: Payer: Self-pay

## 2014-11-18 MED ORDER — CARVEDILOL 6.25 MG PO TABS: ORAL_TABLET | ORAL | Status: DC

## 2014-12-31 ENCOUNTER — Other Ambulatory Visit: Payer: Self-pay | Admitting: Cardiology

## 2015-01-02 ENCOUNTER — Other Ambulatory Visit: Payer: Self-pay | Admitting: Physician Assistant

## 2015-01-02 DIAGNOSIS — Z1231 Encounter for screening mammogram for malignant neoplasm of breast: Secondary | ICD-10-CM

## 2015-01-05 ENCOUNTER — Encounter: Payer: Self-pay | Admitting: Physician Assistant

## 2015-01-05 ENCOUNTER — Ambulatory Visit (INDEPENDENT_AMBULATORY_CARE_PROVIDER_SITE_OTHER): Payer: Managed Care, Other (non HMO) | Admitting: Physician Assistant

## 2015-01-05 VITALS — BP 136/78 | HR 80 | Temp 98.0°F | Resp 16 | Ht 59.75 in | Wt 167.8 lb

## 2015-01-05 DIAGNOSIS — Z124 Encounter for screening for malignant neoplasm of cervix: Secondary | ICD-10-CM | POA: Diagnosis not present

## 2015-01-05 DIAGNOSIS — Z13220 Encounter for screening for lipoid disorders: Secondary | ICD-10-CM | POA: Diagnosis not present

## 2015-01-05 DIAGNOSIS — Z1329 Encounter for screening for other suspected endocrine disorder: Secondary | ICD-10-CM | POA: Diagnosis not present

## 2015-01-05 DIAGNOSIS — Z0000 Encounter for general adult medical examination without abnormal findings: Secondary | ICD-10-CM | POA: Diagnosis not present

## 2015-01-05 DIAGNOSIS — Z130 Encounter for screening for diseases of the blood and blood-forming organs and certain disorders involving the immune mechanism: Secondary | ICD-10-CM | POA: Diagnosis not present

## 2015-01-05 DIAGNOSIS — Z131 Encounter for screening for diabetes mellitus: Secondary | ICD-10-CM

## 2015-01-05 DIAGNOSIS — Z8739 Personal history of other diseases of the musculoskeletal system and connective tissue: Secondary | ICD-10-CM

## 2015-01-05 LAB — COMPREHENSIVE METABOLIC PANEL
ALT: 20 U/L (ref 6–29)
AST: 20 U/L (ref 10–35)
Albumin: 4.1 g/dL (ref 3.6–5.1)
Alkaline Phosphatase: 65 U/L (ref 33–130)
BILIRUBIN TOTAL: 0.5 mg/dL (ref 0.2–1.2)
BUN: 11 mg/dL (ref 7–25)
CO2: 25 mmol/L (ref 20–31)
Calcium: 9.8 mg/dL (ref 8.6–10.4)
Chloride: 103 mmol/L (ref 98–110)
Creat: 0.88 mg/dL (ref 0.50–0.99)
GLUCOSE: 95 mg/dL (ref 65–99)
Potassium: 4.1 mmol/L (ref 3.5–5.3)
SODIUM: 137 mmol/L (ref 135–146)
Total Protein: 7.1 g/dL (ref 6.1–8.1)

## 2015-01-05 LAB — LIPID PANEL
Cholesterol: 238 mg/dL — ABNORMAL HIGH (ref 125–200)
HDL: 76 mg/dL (ref >=46)
LDL CALC: 134 mg/dL — AB (ref <130)
Total CHOL/HDL Ratio: 3.1 Ratio (ref <=5.0)
Triglycerides: 142 mg/dL (ref <150)
VLDL: 28 mg/dL (ref <30)

## 2015-01-05 LAB — CBC
HCT: 40.6 % (ref 36.0–46.0)
Hemoglobin: 13.4 g/dL (ref 12.0–15.0)
MCH: 30.3 pg (ref 26.0–34.0)
MCHC: 33 g/dL (ref 30.0–36.0)
MCV: 91.9 fL (ref 78.0–100.0)
MPV: 10 fL (ref 8.6–12.4)
PLATELETS: 391 10*3/uL (ref 150–400)
RBC: 4.42 MIL/uL (ref 3.87–5.11)
RDW: 14.7 % (ref 11.5–15.5)
WBC: 7.3 10*3/uL (ref 4.0–10.5)

## 2015-01-05 LAB — TSH: TSH: 3.638 u[IU]/mL (ref 0.350–4.500)

## 2015-01-05 MED ORDER — CYCLOBENZAPRINE HCL 10 MG PO TABS: 10.0000 mg | ORAL_TABLET | ORAL | Status: DC | PRN

## 2015-01-05 NOTE — Progress Notes (Signed)
Subjective:    Patient ID: Stacy Mcintosh, female    DOB: 06/29/1955, 59 y.o.   MRN: [id number]  HPI Patient presents for completes physical without any complaints. States that she has been well in the interim and would like a refill on flexeril for her DDD.   Feels like she is just now starting to climb out of the black hole that she has been in for the past 3 years. In 06-21-2010, her mother died and shortly after her husband had an affair. She and her husband have been working hard to get past his infidelity. She additionally lost a close friend, nephew, and brother. Now she is saling her home and closing on her new home. Has not been taking Klonopin daily and takes Xanax about 2x/week. Stress level has been manageable, but has increase over the past month with the housing ordeal. Has appt with psychiatrist this week, but is in the process of change psychologist. Also having marriage counseling with her husband.  Now that she is starting to feel more like herself she would like to get back into exercising like she used to. She enjoyed hiking, swimming, and biking which have been limited due to DDD and decreased energy levels. States that she is working to get back into [employer], yoga, and [employer]. Has appt with see neurologist Dr. Shirlee More for DDD and SI joint dz. Knows that she is deconditioned when she climbs steep steps she feels short of breath, but not associated with CP, edema, or palpitations.   Currently going through menopause, but feels like it has been last for the past 2 years. Had hysterectomy, but still has her ovaries. Has hot flashes, but also has moments of cold intolerance as she thinks her thyroid may be out of wack again. Taking 60 and 15 mg of Armour at same time now. Additionally states that intercourse has been more uncomfortable. In addition to vagina being more dry, it also feels more tight. Admits that desire for intercourse has been down as well. States that husband has been  understand.   Health Maintenance: Colonoscopy- 2009/06/21 normal; Mammogram scheduled 01/2015; Pap today; dental and eye exams UTD; declines flu and shingles; Cardiology last seen 10/2014; has never smoked or used illicit drugs.  Allergies  Allergen Reactions  . Augmentin [Amoxicillin-Pot Clavulanate] Diarrhea    With yeast infection  . Lisinopril Cough   Past Surgical History  Procedure Laterality Date  . Abdominal hysterectomy    . Cesarean section      x3   Review of Systems  Constitutional: Positive for fatigue. Negative for fever, chills, activity change, appetite change and unexpected weight change.  HENT: Negative for tinnitus.   Eyes: Negative.  Negative for photophobia and visual disturbance.  Respiratory: Positive for shortness of breath (when walking up steep steps). Negative for cough and wheezing.   Cardiovascular: Negative.  Negative for chest pain, palpitations and leg swelling.  Gastrointestinal: Negative.  Negative for nausea, vomiting, diarrhea, constipation and blood in stool.  Endocrine: Positive for cold intolerance and heat intolerance.  Genitourinary: Positive for menstrual problem and dyspareunia. Negative for dysuria, frequency, hematuria, decreased urine volume, difficulty urinating and genital sores.  Musculoskeletal: Positive for myalgias (baseline) and back pain (baseline). Negative for joint swelling, arthralgias and gait problem.  Skin: Negative.   Allergic/Immunologic: Positive for environmental allergies (seasonal).  Neurological: Positive for headaches (baseline migraines). Negative for dizziness and light-headedness.  Psychiatric/Behavioral: Positive for dysphoric mood and agitation. The patient is nervous/anxious.  Objective:   Physical Exam  Constitutional: She is oriented to person, place, and time. She appears well-developed and well-nourished. No distress.  Blood pressure 136/78, pulse 80, temperature 98 F (36.7 C), temperature source Oral,  resp. rate 16, height 4' 11.75" (1.518 m), weight 167 lb 12.8 oz (76.114 kg), SpO2 96 %.   HENT:  Head: Normocephalic and atraumatic.  Right Ear: External ear normal.  Left Ear: External ear normal.  Mouth/Throat: Oropharynx is clear and moist.  Eyes: Conjunctivae and EOM are normal. Pupils are equal, round, and reactive to light. Right eye exhibits no discharge. Left eye exhibits no discharge. No scleral icterus.  Neck: Normal range of motion. Neck supple. No JVD present. No thyromegaly present.  Cardiovascular: Normal rate, regular rhythm, normal heart sounds and intact distal pulses.  Exam reveals no gallop and no friction rub.   No murmur heard. Pulmonary/Chest: Effort normal and breath sounds normal. No respiratory distress. She has no wheezes. She has no rales. She exhibits no tenderness.  Abdominal: Soft. Bowel sounds are normal. She exhibits no distension and no mass. There is no tenderness. There is no rebound and no guarding.  Genitourinary: Vagina normal and uterus normal. No vaginal discharge found.  Musculoskeletal: Normal range of motion. She exhibits no edema or tenderness.  Lymphadenopathy:    She has no cervical adenopathy.  Neurological: She is alert and oriented to person, place, and time. She has normal reflexes. No cranial nerve deficit. She exhibits normal muscle tone. Coordination normal.  Skin: Skin is warm and dry. No rash noted. She is not diaphoretic. No erythema. No pallor.  Psychiatric: She has a normal mood and affect. Her behavior is normal. Judgment and thought content normal.       Assessment & Plan:   1. Annual physical exam Age anticipatory guidance discussed and given. Highlighted on the importance of staying active not just for physical health, but also for mental health.   2. Screening for deficiency anemia - CBC  3. Screening for diabetes mellitus - Comprehensive metabolic panel  4. Screening for cholesterol level - Lipid panel  5. Screening  for thyroid disorder Once result comes in will re-fill Armour. - TSH  6. Screening for cervical cancer - Pap IG w/ reflex to HPV when ASC-U  7. H/O degenerative disc disease Has appt with neurologist to discuss other treatment options. Declined fusion from ortho in the past. - cyclobenzaprine (FLEXERIL) 10 MG tablet; Take 1 tablet (10 mg total) by mouth as needed.  Dispense: 30 tablet; Refill: 2   Alveta Heimlich PA-C  [facility] 01/05/2015 4:27 PM

## 2015-01-05 NOTE — Patient Instructions (Signed)

## 2015-01-07 LAB — PAP IG W/ RFLX HPV ASCU

## 2015-01-10 ENCOUNTER — Telehealth: Payer: Self-pay | Admitting: Physician Assistant

## 2015-01-10 NOTE — Telephone Encounter (Signed)
Left vmail to discuss lab results. Will try again Monday.

## 2015-01-12 ENCOUNTER — Ambulatory Visit (HOSPITAL_COMMUNITY): Payer: Self-pay

## 2015-01-12 ENCOUNTER — Telehealth: Payer: Self-pay | Admitting: Physician Assistant

## 2015-01-12 ENCOUNTER — Ambulatory Visit (HOSPITAL_COMMUNITY)
Admission: RE | Admit: 2015-01-12 | Discharge: 2015-01-12 | Disposition: A | Discharge status: Home / Self Care | Payer: Managed Care, Other (non HMO) | Source: Ambulatory Visit | Attending: Physician Assistant | Admitting: Physician Assistant

## 2015-01-12 DIAGNOSIS — Z1231 Encounter for screening mammogram for malignant neoplasm of breast: Secondary | ICD-10-CM | POA: Diagnosis present

## 2015-01-12 NOTE — Telephone Encounter (Signed)
Relayed lab results. Expected cholesterol to be elevated as her and her husband have been eating irregular this past month as they have been packing and trying to move. Still in the process of selling and buying. Was concerned about TSH level which was normal.

## 2015-01-28 ENCOUNTER — Other Ambulatory Visit: Payer: Self-pay | Admitting: Specialist

## 2015-01-28 DIAGNOSIS — M5412 Radiculopathy, cervical region: Secondary | ICD-10-CM

## 2015-01-28 DIAGNOSIS — M5416 Radiculopathy, lumbar region: Secondary | ICD-10-CM

## 2015-02-06 ENCOUNTER — Other Ambulatory Visit: Payer: Self-pay

## 2015-02-06 NOTE — Telephone Encounter (Signed)
Pharm reqs RFs on omeprazole. Stacy Mcintosh, you just saw pt for check up, but don't see this med discussed. OK to RF?

## 2015-02-09 ENCOUNTER — Other Ambulatory Visit: Payer: Self-pay | Admitting: Physician Assistant

## 2015-02-09 MED ORDER — OMEPRAZOLE 40 MG PO CPDR: DELAYED_RELEASE_CAPSULE | ORAL | Status: DC

## 2015-02-12 ENCOUNTER — Ambulatory Visit
Admission: RE | Admit: 2015-02-12 | Discharge: 2015-02-12 | Disposition: A | Discharge status: Home / Self Care | Payer: Managed Care, Other (non HMO) | Source: Ambulatory Visit | Attending: Specialist | Admitting: Specialist

## 2015-02-12 DIAGNOSIS — M5412 Radiculopathy, cervical region: Secondary | ICD-10-CM

## 2015-02-12 DIAGNOSIS — M5416 Radiculopathy, lumbar region: Secondary | ICD-10-CM

## 2015-03-31 ENCOUNTER — Other Ambulatory Visit: Payer: Self-pay | Admitting: Cardiology

## 2015-04-20 ENCOUNTER — Other Ambulatory Visit: Payer: Self-pay

## 2015-04-20 DIAGNOSIS — Z8739 Personal history of other diseases of the musculoskeletal system and connective tissue: Secondary | ICD-10-CM

## 2015-04-20 MED ORDER — THYROID 15 MG PO TABS: 15.0000 mg | ORAL_TABLET | Freq: Every day | ORAL | Status: DC

## 2015-04-20 MED ORDER — THYROID 60 MG PO TABS: 60.0000 mg | ORAL_TABLET | Freq: Every day | ORAL | Status: DC

## 2015-04-20 MED ORDER — CYCLOBENZAPRINE HCL 10 MG PO TABS: 10.0000 mg | ORAL_TABLET | ORAL | Status: DC | PRN

## 2015-06-13 ENCOUNTER — Other Ambulatory Visit: Payer: Self-pay | Admitting: Cardiology

## 2015-07-03 ENCOUNTER — Other Ambulatory Visit: Payer: Self-pay | Admitting: *Deleted

## 2015-07-03 MED ORDER — LOSARTAN POTASSIUM 50 MG PO TABS: ORAL_TABLET | ORAL | Status: DC

## 2015-07-19 ENCOUNTER — Other Ambulatory Visit: Payer: Self-pay | Admitting: Family Medicine

## 2015-07-22 ENCOUNTER — Other Ambulatory Visit: Payer: Self-pay | Admitting: Physician Assistant

## 2015-08-11 ENCOUNTER — Encounter: Payer: Self-pay | Admitting: Physician Assistant

## 2015-08-11 ENCOUNTER — Ambulatory Visit (INDEPENDENT_AMBULATORY_CARE_PROVIDER_SITE_OTHER): Payer: Managed Care, Other (non HMO) | Admitting: Physician Assistant

## 2015-08-11 VITALS — BP 149/82 | HR 50 | Temp 97.9°F | Resp 16 | Ht 60.0 in | Wt 158.0 lb

## 2015-08-11 DIAGNOSIS — R5382 Chronic fatigue, unspecified: Secondary | ICD-10-CM

## 2015-08-11 DIAGNOSIS — Z8619 Personal history of other infectious and parasitic diseases: Secondary | ICD-10-CM | POA: Insufficient documentation

## 2015-08-11 DIAGNOSIS — Z1159 Encounter for screening for other viral diseases: Secondary | ICD-10-CM

## 2015-08-11 DIAGNOSIS — Z780 Asymptomatic menopausal state: Secondary | ICD-10-CM | POA: Insufficient documentation

## 2015-08-11 LAB — CBC WITH DIFFERENTIAL/PLATELET
BASOS PCT: 0 %
Basophils Absolute: 0 cells/uL (ref 0–200)
EOS ABS: 126 {cells}/uL (ref 15–500)
Eosinophils Relative: 2 %
HCT: 42.2 % (ref 35.0–45.0)
Hemoglobin: 13.5 g/dL (ref 11.7–15.5)
LYMPHS PCT: 44 %
Lymphs Abs: 2772 cells/uL (ref 850–3900)
MCH: 29.7 pg (ref 27.0–33.0)
MCHC: 32 g/dL (ref 32.0–36.0)
MCV: 93 fL (ref 80.0–100.0)
MONOS PCT: 9 %
MPV: 10.8 fL (ref 7.5–12.5)
Monocytes Absolute: 567 cells/uL (ref 200–950)
NEUTROS ABS: 2835 {cells}/uL (ref 1500–7800)
Neutrophils Relative %: 45 %
PLATELETS: 393 10*3/uL (ref 140–400)
RBC: 4.54 MIL/uL (ref 3.80–5.10)
RDW: 13.9 % (ref 11.0–15.0)
WBC: 6.3 10*3/uL (ref 3.8–10.8)

## 2015-08-11 NOTE — Progress Notes (Signed)
Can we get Stacy Mcintosh in sooner than July? See if we can work her in to see me in the next few weeks.

## 2015-08-11 NOTE — Progress Notes (Signed)
Subjective:   Patient ID: Stacy Mcintosh, female     DOB: 1955-06-12, 60 y.o.    MRN: [id number]  PCP: No primary care provider on file.  Chief Complaint  Patient presents with  . Fatigue    told by neuro that it was Lyme Disease  . Headache  . Medication Refill  . Depression    per screen    HPI  Presents for evaluation of ongoing fatigue.  Of note patient has a history of thyroid disease, nonischemic cardiomyopathy, CHF systolic dysfunction (last Echo EF 30% in 02/2014), DVT, PE, depression, and migraines.  Patient has had ongoing fatigue for the past year. She was referred to Neurology in November/December of 2016. At that time her TSH was normal. They ran a lyme titer due to patient being an outdoor hiker and being from [place]. She had positive titers. She was treated with 3 antibiotics for lyme disease. Patient is not sure which 3 antibiotics. Patient states that her fatigue improved slightly.   However, in the past 2 weeks her fatigue has been severe. She has had several near syncope episodes over the past two weeks. She has been very weak. She also endorses mild SOB with exertion and mild bilateral LE edema.   Patient is concerned that her menopause or lyme flare is causing her fatigue. Patient has been trying OTC progesterone cream and states she was having a "little pep in my step" after using the creme. Patient went through menopause approx 10 years ago and has not had any symptoms.   Patient last echo was in Nov 2015 that revealed an EF of 30%. She was supposed to follow back up with cardiology at the heart failure clinic but did not (the patient states that there must have been a miscommunication, that she never knew about recommended follow-up or Heart Failure Clinic. The chart messages document that the patient cancelled the follow-up due to cost). She has not seen her cardiologist since 02/2014. She reportedly could not get an appointment with Dr. Shirlee Latch until July of  2017.   Patient and her son state that her diet consists of a considerable amount of sodium.     Prior to Admission medications   Medication Sig Start Date End Date Taking? Authorizing Provider  ALPRAZolam Prudy Feeler) 0.5 MG tablet Take 0.5 mg by mouth 3 (three) times daily as needed.     Yes Historical Provider, MD  ARMOUR THYROID 15 MG tablet TAKE 1 TABLET (15 MG TOTAL) BY MOUTH DAILY. 07/23/15  Yes Stephanie D English, PA  ARMOUR THYROID 60 MG tablet TAKE 1 TABLET (60 MG TOTAL) BY MOUTH DAILY BEFORE BREAKFAST. 07/23/15  Yes Collie Siad English, PA  aspirin 81 MG tablet Take 81 mg by mouth daily.     Yes Historical Provider, MD  carvedilol (COREG) 6.25 MG tablet TAKE 1 TABLET BY MOUTH 2 (TWO) TIMES DAILY. 06/15/15  Yes Laurey Morale, MD  clonazePAM (KLONOPIN) 1 MG tablet Take 1 mg by mouth daily as needed.  10/12/10  Yes Historical Provider, MD  cyclobenzaprine (FLEXERIL) 10 MG tablet TAKE 1 TABLET (10 MG TOTAL) BY MOUTH AS NEEDED. 07/23/15  Yes Collie Siad English, PA  doxepin (SINEQUAN) 10 MG capsule Take 10 mg by mouth.   Yes Historical Provider, MD  losartan (COZAAR) 50 MG tablet TAKE 1 TABLET (50 MG TOTAL) BY MOUTH DAILY. Schedule Follow Up 07/03/15  Yes Laurey Morale, MD  omeprazole (PRILOSEC) 40 MG capsule TAKE 1 CAPSULE (40 MG  TOTAL) BY MOUTH DAILY  "OFFICE VISIT NEEDED" 02/10/15  Yes Lanier Clam V, PA-C  omeprazole (PRILOSEC) 40 MG capsule TAKE 1 CAPSULE BY MOUTH DAILY. 07/21/15  Yes Stephanie D English, PA  simvastatin (ZOCOR) 20 MG tablet Take 1 tablet (20 mg total) by mouth at bedtime. 07/07/14  Yes Laurey Morale, MD  Topiramate (TOPAMAX PO) Take by mouth.   Yes Historical Provider, MD  Vitamin D, Ergocalciferol, (DRISDOL) 50000 units CAPS capsule Take 50,000 Units by mouth every 7 (seven) days.   Yes Historical Provider, MD     Allergies  Allergen Reactions  . Augmentin [Amoxicillin-Pot Clavulanate] Diarrhea    With yeast infection  . Lisinopril Cough     Patient Active  Problem List   Diagnosis Date Noted  . Postmenopausal 08/11/2015  . History of Lyme disease 08/11/2015  . Nonischemic cardiomyopathy (HCC) 03/04/2014  . Depression 02/27/2014  . 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 PE (pulmonary embolism) 01/25/2011  . 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 OTHER AND UNSPECIFIED HYPERLIPIDEMIA 01/19/2010  . PURE HYPERCHOLESTEROLEMIA 11/18/2008  . CARDIOMYOPATHY 11/12/2008  . LBBB (left bundle branch block) 11/12/2008  . Essential hypertension 10/16/2007  . HEADACHE, CHRONIC 10/16/2007     Family History  Problem Relation Age of Onset  . Heart attack Mother   . Hypertension Mother   . Rheumatologic disease Mother   . Ovarian cancer Mother   . Prostate cancer Father   . Arthritis Sister   . Hypertension Sister   . Thyroid disease Brother     Parathyroid problem  . Diabetes Brother   . Heart attack Brother   . Sudden death Maternal Grandfather      Social History   Social History  . Marital Status: Married    Spouse Name: N/A  . Number of Children: 3  . Years of Education: College   Occupational History  . Massage Therapist    Social History Main Topics  . Smoking status: Never Smoker   . Smokeless tobacco: Never Used  . Alcohol Use: 0.0 oz/week    0 Standard drinks or equivalent per week     Comment: rare 3  . Drug Use: No  . Sexual Activity: Not Currently   Other Topics Concern  . Not on file   Social History Narrative        Review of Systems Constitutional: Positive for fatigue. Negative for fever, chills and unexpected weight change.  Eyes: Negative.  Respiratory: Positive for shortness of breath. Negative for cough and wheezing.  Cardiovascular: Positive for leg swelling. Negative for chest pain and palpitations.  Gastrointestinal: Negative.  Endocrine: Negative for cold intolerance and heat intolerance.  Genitourinary: Negative for dysuria, urgency, frequency and flank pain.  Neurological:  Positive for dizziness, weakness, light-headedness and headaches. Negative for syncope.  Psychiatric/Behavioral: Negative for sleep disturbance. The patient is not nervous/anxious.         Objective:  Physical Exam  Constitutional: She is oriented to person, place, and time. She appears well-developed and well-nourished. She is active and cooperative. No distress.  BP 149/82 mmHg  Pulse 50  Temp(Src) 97.9 F (36.6 C)  Resp 16  Ht 5' (1.524 m)  Wt 158 lb (71.668 kg)  BMI 30.86 kg/m2  HENT:  Head: Normocephalic and atraumatic.  Right Ear: Hearing normal.  Left Ear: Hearing normal.  Eyes: Conjunctivae are normal. No scleral icterus.  Neck: Normal range of motion. Neck supple. No thyromegaly present.  Cardiovascular: Regular rhythm and normal heart sounds.  No extrasystoles are present. Bradycardia present.   Pulses:      Radial pulses are 2+ on the right side, and 2+ on the left side.  No LE or Pre-Sacral Edema  Pulmonary/Chest: Effort normal and breath sounds normal.  Lymphadenopathy:       Head (right side): No tonsillar, no preauricular, no posterior auricular and no occipital adenopathy present.       Head (left side): No tonsillar, no preauricular, no posterior auricular and no occipital adenopathy present.    She has no cervical adenopathy.       Right: No supraclavicular adenopathy present.       Left: No supraclavicular adenopathy present.  Neurological: She is alert and oriented to person, place, and time. No sensory deficit.  Skin: Skin is warm, dry and intact. No rash noted. No cyanosis or erythema. Nails show no clubbing.  Psychiatric: She has a normal mood and affect. Her speech is normal and behavior is normal.             Assessment & Plan:  1. Chronic fatigue I suspect this is due to the cardiomyopathy and her low EF. As she is without edema or acute SOB, I'll contact cardiology and see if she can be worked in sooner than July, or if updating the ECHO  or starting the CHF clinic process would be appropriate. Update CMET, CBC and TSH, though anticipate they will be normal. Counseled on heart healthy, low-sodium diet. - CBC with Differential/Platelet - Comprehensive metabolic panel - TSH  2. Need for hepatitis C screening test - Hepatitis C antibody   Fernande Bras, PA-C Physician Assistant-Certified [facility]

## 2015-08-11 NOTE — Progress Notes (Signed)
I'll work on getting her in the office in the next couple of weeks.

## 2015-08-11 NOTE — Progress Notes (Signed)
Subjective:    Patient ID: Stacy Mcintosh, female    DOB: November 18, 1955, 60 y.o.   MRN: [id number]  Chief Complaint  Patient presents with  . Fatigue    told by neuro that it was Lyme Disease  . Headache  . Medication Refill  . Depression    per screen    HPI  Patient presents for evaluation of ongoing fatigue.  Of note patient has a history of thyroid disease, nonischemic cardiomyopathy, CHF systolic dysfunction (last Echo EF 30% in 02/2014), DVT, PE, depression, and migraines.  Patient has had ongoing fatigue for the past year. She was referred to Neurology in November/December of 2016. At that time her TSH was normal. They ran a lyme titer due to patient be an outdoor hiker and being from [place]. She had positive titers. She was treated with 3 antibiotics for lyme disease. Patient is not sure which 3 antibiotics. Patient states that her fatigue improved slightly.   However, in the past 2 weeks her fatigue has been severe. She has had several near syncope episodes over the past two weeks. She has been very weak. She also endorses mild SOB with exertion and mild bilateral LE edema.   Patient is concerned that her menopause or lyme flare is causing her fatigue. Patient has been trying OTC progesterone cream and states she was having a "little pep in my step" after using the creme. Patient went through menopause approx 10 years ago and has not had any symptoms.   Patient last echo was in Nov 2015 that revealed an EF of 30%. She was suppose to follow back of with cardiology at the heart failure clinic but did not due to lack of communication. She has not seen her cardiologist since 2015. She could not get an appointment with Dr. Shirlee Latch until July of 2017.   Patient and her son state that her diet consists of a considerable amount of sodium.   Past Medical History  Diagnosis Date  . Arthritis   . Depression   . Hx of migraines   . Pure hypercholesterolemia 11/18/2008  . CARDIOMYOPATHY  11/12/2008  . HYPERTENSION 10/16/2007  . Other and unspecified hyperlipidemia 01/19/2010  . BUNDLE BRANCH BLOCK, LEFT 11/12/2008  . ASTHMA 10/16/2007  . Hypothyroidism     h/o Graves Disease  . PE (pulmonary embolism) 2009    Factor V negative  . Anxiety   . CHF (congestive heart failure) [facility])    Past Surgical History  Procedure Laterality Date  . Abdominal hysterectomy    . Cesarean section      x3   Current Outpatient Prescriptions on File Prior to Visit  Medication Sig Dispense Refill  . ALPRAZolam (XANAX) 0.5 MG tablet Take 0.5 mg by mouth 3 (three) times daily as needed.      Mack Guise THYROID 15 MG tablet TAKE 1 TABLET (15 MG TOTAL) BY MOUTH DAILY. 30 tablet 0  . ARMOUR THYROID 60 MG tablet TAKE 1 TABLET (60 MG TOTAL) BY MOUTH DAILY BEFORE BREAKFAST. 30 tablet 0  . aspirin 81 MG tablet Take 81 mg by mouth daily.      . carvedilol (COREG) 6.25 MG tablet TAKE 1 TABLET BY MOUTH 2 (TWO) TIMES DAILY. 180 tablet 0  . clonazePAM (KLONOPIN) 1 MG tablet Take 1 mg by mouth daily as needed.     . cyclobenzaprine (FLEXERIL) 10 MG tablet TAKE 1 TABLET (10 MG TOTAL) BY MOUTH AS NEEDED. 30 tablet 0  . losartan (COZAAR)  50 MG tablet TAKE 1 TABLET (50 MG TOTAL) BY MOUTH DAILY. Schedule Follow Up 90 tablet 0  . omeprazole (PRILOSEC) 40 MG capsule TAKE 1 CAPSULE (40 MG TOTAL) BY MOUTH DAILY  "OFFICE VISIT NEEDED" 90 capsule 0  . omeprazole (PRILOSEC) 40 MG capsule TAKE 1 CAPSULE BY MOUTH DAILY. 30 capsule 0  . simvastatin (ZOCOR) 20 MG tablet Take 1 tablet (20 mg total) by mouth at bedtime. 90 tablet 2   No current facility-administered medications on file prior to visit.   Allergies  Allergen Reactions  . Augmentin [Amoxicillin-Pot Clavulanate] Diarrhea    With yeast infection  . Lisinopril Cough       Review of Systems  Constitutional: Positive for fatigue. Negative for fever, chills and unexpected weight change.  Eyes: Negative.   Respiratory: Positive for shortness of breath.  Negative for cough and wheezing.   Cardiovascular: Positive for leg swelling. Negative for chest pain and palpitations.  Gastrointestinal: Negative.   Endocrine: Negative for cold intolerance and heat intolerance.  Genitourinary: Negative for dysuria, urgency, frequency and flank pain.  Neurological: Positive for dizziness, weakness, light-headedness and headaches. Negative for syncope.  Psychiatric/Behavioral: Negative for sleep disturbance. The patient is not nervous/anxious.        Objective:   Physical Exam  Constitutional: She is oriented to person, place, and time. She appears well-developed and well-nourished. No distress.  HENT:  Head: Normocephalic and atraumatic.  Right Ear: External ear normal.  Left Ear: External ear normal.  Eyes: Conjunctivae and EOM are normal. Pupils are equal, round, and reactive to light.  Neck: Normal range of motion. Neck supple. No thyromegaly present.  Cardiovascular: Regular rhythm, normal heart sounds and intact distal pulses.  Bradycardia present.   Pulses:      Radial pulses are 2+ on the right side, and 2+ on the left side.       Dorsalis pedis pulses are 2+ on the right side, and 2+ on the left side.  Neurological: She is alert and oriented to person, place, and time. She has normal reflexes.  Skin: Skin is warm and dry.  Psychiatric: She has a normal mood and affect. Her behavior is normal. Judgment and thought content normal.          Assessment & Plan:  1. Chronic fatigue Likely due to CHF and EF of 30% who has failed to follow back up with cardiologist. Due to patient not having acutee SOB or edema patient to follow up with cardiologist sooner than July. Will order CMET, CBC and TSH to r/o other causes of fatigue which I suspect will be normal. Counseled patient on being heart healthy including low sodium. - CBC with Differential/Platelet - Comprehensive metabolic panel - TSH  2. Need for hepatitis C screening tes - Hepatitis C  antibody  Azucena Kuba PA-S 08/11/2015

## 2015-08-11 NOTE — Patient Instructions (Signed)
     IF you received an x-ray today, you will receive an invoice from [facility]. Please contact  Radiology at [phone] with questions or concerns regarding your invoice.   IF you received labwork today, you will receive an invoice from [facility]. Please contact [facility] at [phone] with questions or concerns regarding your invoice.   Our billing staff will not be able to assist you with questions regarding bills from these companies.  You will be contacted with the lab results as soon as they are available. The fastest way to get your results is to activate your My Chart account. Instructions are located on the last page of this paperwork. If you have not heard from us regarding the results in 2 weeks, please contact this office.      

## 2015-08-12 ENCOUNTER — Other Ambulatory Visit: Payer: Self-pay | Admitting: *Deleted

## 2015-08-12 ENCOUNTER — Telehealth: Payer: Self-pay

## 2015-08-12 ENCOUNTER — Telehealth (HOSPITAL_COMMUNITY): Payer: Self-pay | Admitting: Cardiology

## 2015-08-12 ENCOUNTER — Telehealth: Payer: Self-pay | Admitting: Physician Assistant

## 2015-08-12 DIAGNOSIS — I428 Other cardiomyopathies: Secondary | ICD-10-CM

## 2015-08-12 LAB — COMPREHENSIVE METABOLIC PANEL
ALK PHOS: 65 U/L (ref 33–130)
ALT: 36 U/L — AB (ref 6–29)
AST: 22 U/L (ref 10–35)
Albumin: 4.2 g/dL (ref 3.6–5.1)
BILIRUBIN TOTAL: 0.4 mg/dL (ref 0.2–1.2)
BUN: 11 mg/dL (ref 7–25)
CO2: 22 mmol/L (ref 20–31)
CREATININE: 0.92 mg/dL (ref 0.50–0.99)
Calcium: 9.6 mg/dL (ref 8.6–10.4)
Chloride: 109 mmol/L (ref 98–110)
GLUCOSE: 93 mg/dL (ref 65–99)
Potassium: 4.5 mmol/L (ref 3.5–5.3)
Sodium: 140 mmol/L (ref 135–146)
TOTAL PROTEIN: 6.8 g/dL (ref 6.1–8.1)

## 2015-08-12 LAB — TSH: TSH: 0.83 m[IU]/L

## 2015-08-12 NOTE — Telephone Encounter (Signed)
Chelle, did you want to Rx anything. I do not see anything in the note.

## 2015-08-12 NOTE — Telephone Encounter (Signed)
Received message from PCP. Please arrange FU with Dr. Marca Ancona in the CHF Clinic in the next 1-2 weeks. If the patient needs to be seen at [facility] (due to finances), ok to schedule with me in next 1-2 weeks. It has been a year since her last echo.  She will need a FU echo. Please try to schedule complete Echo prior to her visit with Dr. Marca Ancona (if possible). Tereso Newcomer, PA-C   08/12/2015 8:43 AM

## 2015-08-12 NOTE — Telephone Encounter (Signed)
Patient is calling because she was seen yesterday and none of the medications were sent in. She doesn't know exactly what they are but states there are several. Please advise!  [phone]

## 2015-08-12 NOTE — Telephone Encounter (Signed)
-----   Message from [place], [place] sent at 08/11/2015  2:49 PM EDT ----- I believe this patient is now willing to follow up! She can't get in with you until July. Any way she can be seen sooner? Does she need to have another ECHO? See notes 03/2014.

## 2015-08-12 NOTE — Telephone Encounter (Signed)
-----   Message from Laurey Morale, MD sent at 08/11/2015 10:05 PM EDT -----   ----- Message -----    From: Porfirio Oar, PA-C    Sent: 08/11/2015   2:49 PM      To: Laurey Morale, MD  I believe this patient is now willing to follow up! She can't get in with you until July. Any way she can be seen sooner? Does she need to have another ECHO? See notes 03/2014.

## 2015-08-12 NOTE — Telephone Encounter (Signed)
appt 5/25 @12  given to patient with parking code and instruction for parking

## 2015-08-13 MED ORDER — THYROID 15 MG PO TABS: ORAL_TABLET | ORAL | Status: DC

## 2015-08-13 MED ORDER — DOXEPIN HCL 10 MG PO CAPS: 10.0000 mg | ORAL_CAPSULE | Freq: Every evening | ORAL | Status: DC | PRN

## 2015-08-13 MED ORDER — LOSARTAN POTASSIUM 50 MG PO TABS: ORAL_TABLET | ORAL | Status: DC

## 2015-08-13 MED ORDER — THYROID 60 MG PO TABS: ORAL_TABLET | ORAL | Status: DC

## 2015-08-13 MED ORDER — SIMVASTATIN 20 MG PO TABS: 20.0000 mg | ORAL_TABLET | Freq: Every day | ORAL | Status: DC

## 2015-08-13 MED ORDER — VITAMIN D (ERGOCALCIFEROL) 1.25 MG (50000 UNIT) PO CAPS: 50000.0000 [IU] | ORAL_CAPSULE | ORAL | Status: DC

## 2015-08-13 MED ORDER — CYCLOBENZAPRINE HCL 10 MG PO TABS: ORAL_TABLET | ORAL | Status: DC

## 2015-08-13 MED ORDER — OMEPRAZOLE 40 MG PO CPDR: 40.0000 mg | DELAYED_RELEASE_CAPSULE | Freq: Every day | ORAL | Status: DC

## 2015-08-13 MED ORDER — CARVEDILOL 6.25 MG PO TABS: ORAL_TABLET | ORAL | Status: DC

## 2015-08-13 NOTE — Telephone Encounter (Signed)
Left message for pt to call back  °

## 2015-08-13 NOTE — Telephone Encounter (Signed)
I think I missed the part of her cc that said she needed refills, so none were refilled at her visit.  I am sorry.  I have refilled them all except: alprazolam, clonazepam and topiramate.  I will refill EITHER the alprazolam OR the clonazepam. I prefer the clonazepam. Is there a specific reason that she takes both?  I do not know what dose topiramate she takes. Happy to refill it, once that is clarified.    Meds ordered this encounter  Medications  . thyroid (ARMOUR THYROID) 15 MG tablet    Sig: TAKE 1 TABLET (15 MG TOTAL) BY MOUTH DAILY.    Dispense:  90 tablet    Refill:  3    Order Specific Question:  Supervising Provider    Answer:  DOOLITTLE, ROBERT P [[id number]]  . thyroid (ARMOUR THYROID) 60 MG tablet    Sig: TAKE 1 TABLET (60 MG TOTAL) BY MOUTH DAILY BEFORE BREAKFAST.    Dispense:  90 tablet    Refill:  3    Order Specific Question:  Supervising Provider    Answer:  DOOLITTLE, ROBERT P [[id number]]  . carvedilol (COREG) 6.25 MG tablet    Sig: TAKE 1 TABLET BY MOUTH 2 (TWO) TIMES DAILY.    Dispense:  180 tablet    Refill:  3    Order Specific Question:  Supervising Provider    Answer:  DOOLITTLE, ROBERT P [[id number]]  . cyclobenzaprine (FLEXERIL) 10 MG tablet    Sig: TAKE 1 TABLET (10 MG TOTAL) BY MOUTH AS NEEDED.    Dispense:  30 tablet    Refill:  5    Order Specific Question:  Supervising Provider    Answer:  DOOLITTLE, ROBERT P [[id number]]  . doxepin (SINEQUAN) 10 MG capsule    Sig: Take 1 capsule (10 mg total) by mouth at bedtime as needed.    Dispense:  30 capsule    Refill:  5    Order Specific Question:  Supervising Provider    Answer:  DOOLITTLE, ROBERT P [[id number]]  . losartan (COZAAR) 50 MG tablet    Sig: TAKE 1 TABLET (50 MG TOTAL) BY MOUTH DAILY. Schedule Follow Up    Dispense:  90 tablet    Refill:  3    Order Specific Question:  Supervising Provider    Answer:  DOOLITTLE, ROBERT P [[id number]]  . omeprazole (PRILOSEC) 40 MG capsule    Sig: Take 1 capsule (40 mg total) by  mouth daily.    Dispense:  90 capsule    Refill:  3    Order Specific Question:  Supervising Provider    Answer:  DOOLITTLE, ROBERT P [[id number]]  . simvastatin (ZOCOR) 20 MG tablet    Sig: Take 1 tablet (20 mg total) by mouth at bedtime.    Dispense:  90 tablet    Refill:  3    Order Specific Question:  Supervising Provider    Answer:  DOOLITTLE, ROBERT P [[id number]]  . Vitamin D, Ergocalciferol, (DRISDOL) 50000 units CAPS capsule    Sig: Take 1 capsule (50,000 Units total) by mouth every 7 (seven) days.    Dispense:  30 capsule    Refill:  1    Order Specific Question:  Supervising Provider    Answer:  DOOLITTLE, ROBERT P [[id number]]

## 2015-08-14 ENCOUNTER — Telehealth: Payer: Self-pay

## 2015-08-14 NOTE — Telephone Encounter (Signed)
Left message for pt to call back  °

## 2015-08-14 NOTE — Telephone Encounter (Signed)
Patient is returning missed phone call in regards to the previous message from [place]. [phone]

## 2015-08-17 NOTE — Telephone Encounter (Signed)
Left message for pt to call back  °

## 2015-08-20 ENCOUNTER — Encounter: Payer: Self-pay | Admitting: *Deleted

## 2015-08-20 NOTE — Telephone Encounter (Signed)
Mychart message sent.

## 2015-08-31 ENCOUNTER — Other Ambulatory Visit: Payer: Self-pay

## 2015-08-31 ENCOUNTER — Encounter: Payer: Self-pay | Admitting: Physician Assistant

## 2015-08-31 ENCOUNTER — Ambulatory Visit (HOSPITAL_COMMUNITY): Payer: Managed Care, Other (non HMO) | Attending: Internal Medicine

## 2015-08-31 DIAGNOSIS — I517 Cardiomegaly: Secondary | ICD-10-CM | POA: Diagnosis not present

## 2015-08-31 DIAGNOSIS — I428 Other cardiomyopathies: Secondary | ICD-10-CM

## 2015-08-31 DIAGNOSIS — I429 Cardiomyopathy, unspecified: Secondary | ICD-10-CM

## 2015-08-31 DIAGNOSIS — I071 Rheumatic tricuspid insufficiency: Secondary | ICD-10-CM | POA: Diagnosis not present

## 2015-08-31 DIAGNOSIS — I340 Nonrheumatic mitral (valve) insufficiency: Secondary | ICD-10-CM | POA: Diagnosis not present

## 2015-09-01 ENCOUNTER — Telehealth: Payer: Self-pay | Admitting: *Deleted

## 2015-09-01 NOTE — Telephone Encounter (Signed)
Lmtcb to go over echo results.  

## 2015-09-02 NOTE — Telephone Encounter (Signed)
**Note De-Identified Pasquale Matters Obfuscation** The pt is advised of her Echo results and she verbalized understanding.

## 2015-09-02 NOTE — Telephone Encounter (Signed)
Follow up    Returning a call to the nurse to get echo results 

## 2015-09-17 ENCOUNTER — Encounter (HOSPITAL_COMMUNITY): Payer: Self-pay

## 2015-09-17 ENCOUNTER — Ambulatory Visit (HOSPITAL_COMMUNITY)
Admission: RE | Admit: 2015-09-17 | Discharge: 2015-09-17 | Disposition: A | Discharge status: Home / Self Care | Payer: Managed Care, Other (non HMO) | Source: Ambulatory Visit | Attending: Cardiology | Admitting: Cardiology

## 2015-09-17 VITALS — BP 140/86 | HR 57 | Wt 159.8 lb

## 2015-09-17 DIAGNOSIS — E890 Postprocedural hypothyroidism: Secondary | ICD-10-CM | POA: Insufficient documentation

## 2015-09-17 DIAGNOSIS — Z833 Family history of diabetes mellitus: Secondary | ICD-10-CM | POA: Diagnosis not present

## 2015-09-17 DIAGNOSIS — I10 Essential (primary) hypertension: Secondary | ICD-10-CM | POA: Insufficient documentation

## 2015-09-17 DIAGNOSIS — Z7982 Long term (current) use of aspirin: Secondary | ICD-10-CM | POA: Insufficient documentation

## 2015-09-17 DIAGNOSIS — Z8249 Family history of ischemic heart disease and other diseases of the circulatory system: Secondary | ICD-10-CM | POA: Diagnosis not present

## 2015-09-17 DIAGNOSIS — R059 Cough, unspecified: Secondary | ICD-10-CM

## 2015-09-17 DIAGNOSIS — J45909 Unspecified asthma, uncomplicated: Secondary | ICD-10-CM | POA: Insufficient documentation

## 2015-09-17 DIAGNOSIS — Z79899 Other long term (current) drug therapy: Secondary | ICD-10-CM | POA: Diagnosis not present

## 2015-09-17 DIAGNOSIS — R05 Cough: Secondary | ICD-10-CM

## 2015-09-17 DIAGNOSIS — E7800 Pure hypercholesterolemia, unspecified: Secondary | ICD-10-CM | POA: Diagnosis not present

## 2015-09-17 DIAGNOSIS — I429 Cardiomyopathy, unspecified: Secondary | ICD-10-CM | POA: Diagnosis not present

## 2015-09-17 DIAGNOSIS — I428 Other cardiomyopathies: Secondary | ICD-10-CM | POA: Diagnosis not present

## 2015-09-17 DIAGNOSIS — Z86711 Personal history of pulmonary embolism: Secondary | ICD-10-CM | POA: Diagnosis not present

## 2015-09-17 DIAGNOSIS — I5022 Chronic systolic (congestive) heart failure: Secondary | ICD-10-CM | POA: Diagnosis not present

## 2015-09-17 LAB — LIPID PANEL
Cholesterol: 195 mg/dL (ref 0–200)
HDL: 72 mg/dL (ref >40)
LDL CALC: 105 mg/dL — AB (ref 0–99)
Total CHOL/HDL Ratio: 2.7 RATIO
Triglycerides: 91 mg/dL (ref <150)
VLDL: 18 mg/dL (ref 0–40)

## 2015-09-17 LAB — BRAIN NATRIURETIC PEPTIDE: B Natriuretic Peptide: 261.4 pg/mL — ABNORMAL HIGH (ref 0.0–100.0)

## 2015-09-17 MED ORDER — SACUBITRIL-VALSARTAN 24-26 MG PO TABS: 1.0000 | ORAL_TABLET | Freq: Two times a day (BID) | ORAL | Status: DC

## 2015-09-17 MED ORDER — POTASSIUM CHLORIDE CRYS ER 20 MEQ PO TBCR: 20.0000 meq | EXTENDED_RELEASE_TABLET | Freq: Every day | ORAL | Status: DC

## 2015-09-17 NOTE — Patient Instructions (Signed)
Stop Losartan   Start Entresto 24/26 mg Twice daily   Start Potassium (K-Dur) 20 meq daily  Chest x-ray today  Take take Robitussin for your cough  Labs today  Your physician recommends that you schedule a follow-up appointment in: 2 weeks

## 2015-09-18 ENCOUNTER — Other Ambulatory Visit: Payer: Self-pay | Admitting: Cardiology

## 2015-09-18 ENCOUNTER — Telehealth (HOSPITAL_COMMUNITY): Payer: Self-pay | Admitting: Pharmacist

## 2015-09-18 ENCOUNTER — Other Ambulatory Visit (HOSPITAL_COMMUNITY): Payer: Self-pay | Admitting: Pharmacist

## 2015-09-18 DIAGNOSIS — I5022 Chronic systolic (congestive) heart failure: Secondary | ICD-10-CM | POA: Insufficient documentation

## 2015-09-18 MED ORDER — POTASSIUM CHLORIDE ER 10 MEQ PO TBCR: 10.0000 meq | EXTENDED_RELEASE_TABLET | Freq: Every day | ORAL | Status: DC

## 2015-09-18 MED ORDER — FUROSEMIDE 20 MG PO TABS: 20.0000 mg | ORAL_TABLET | Freq: Every day | ORAL | Status: DC

## 2015-09-18 NOTE — Telephone Encounter (Signed)
Per discussion with Dr. Shirlee Latch, patient is to start lasix 20 mg PO daily and KCl 10 meq PO daily. [facility] [address]. KCl 20 meq PO daily already picked up by patient yesterday so I asked patient to only take 1/2 tablet daily for the time being and will reassess need at next appointment. She repeated instructions to me and verbalized understanding.   Tyler Deis. Bonnye Fava, PharmD, BCPS, CPP Clinical Pharmacist Pager: [phone] Phone: [phone] 09/18/2015 12:34 PM

## 2015-09-18 NOTE — Progress Notes (Signed)
Patient ID: Stacy Mcintosh, female   DOB: [birth year], 60 y.o.   MRN: [id number] PCP: Porfirio Oar  60 yo with history of hypothyroidism, nonischemic cardiomyopathy, and PE presents for cardiology followup.  Hypothyroidism has been controlled by Armour thyroid.  She is now seeing Dr. Lucianne Muss for endocrine management.  He last echo in 5/17 showed EF 35-40% with diffuse hypokinesis.    For the last few months, she has been short of breath with exertion.  Dyspnea after walking 40-50 feet or walking up steps.  Severe fatigue, chronic cough.  No chest pain.  No orthopnea/PND.  Weight is not significantly increased.    ECG: NSR, LBBB  Labs (10/10): BNP 13, K 3.7, creatinine 0.8 Labs (7/11): K 4.1, creatinine 0.6 Labs (7/12): LDL 143, HDL 74, K 3.3, creatinine 1.0, TSH normal Labs (12/12): K 4, creatinine 0.8, HDL 85, LDL 107 Labs (11/15): K 4.2, creatinine 0.74, LDL 78, HDL 76 Labs (4/17): TSH normal, K 4.5, creatinine 0.92, HCT 42.2  Allergies (verified):  1)  ! Lisinopril  Past Medical History: 1. Hypothyroidism and the patient has history of Graves disease.  She is status post radioactive iodine therapy.  She then developed hypothyroidism, and the hypothyroidism has been difficult to control.   2. Cardiomyopathy, nonischemic.  The patient had a cardiac MRI done during her hospitalization in July 2009.  This showed moderate LV enlargement and global LV hypokinesis.  There was a septal paradox pattern.  EF was calculated to be 27%.  There was mild left atrial enlargement.  The RV was normal in size and function.  There was no ASD or VSD.  There was no evidence for delayed enhancement pattern  in the LV myocardium suggesting that there was no scar from coronary artery disease and no definite evidence for infiltrative cardiomyopathy.  HIV was negative.  She did have an echo done also in the hospital in July 2009 that showed severe mitral regurgitation.  A TEE done following this showed an EF of 25-30% with  severe mitral regurgitation.  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  The patient did have a left and right heart catheterization after treatment for congestive heart failure in January 2010.  There was no angiographic coronary disease.  EF had improved to 55-60% with no significant mitral  regurgitation.  The hemodynamics were totally normal on the rightheart catheterization.  The cardiomyopathy, I think, was likely du eto poorly controlled hypothyroidism.   - Echo (8/10): EF 50-55%, mild LV dilatation, mild-mod MR, PASP 32 - Echo (11/15): EF 30% - Echo (5/17): EF 35-40%, mild-moderate TR, moderate MR, PASP 50 mmHg.  3. PE/DVT.  July 2009.  The patient had been very sedentary prior to this due to her severe hypothyroidism.  She had  normal antithrombin III.  She was negative for factor V Leiden.  She had normal protein C and protein S function.  Now off coumadin.  4. Hypertension. 5. History of left bundle-branch block. 6. Asthma. 7. Migraines. 8. ACEI cough 9. Low back pain: L-spine disease.  10. Lyme disease: Treated in 12/16.   Family History: Mother-living; heart attack, HTN, Rheumatism Father-deceased age 67; prostate cancer Brother-living, parathyroid problem Sister- living; arthiritis Brother- living; premature heart attack x 2; DM-insulin Niece- blood clot in groin  Social History: Married with 3 children  Teacher, adult education. Never smoked ETOH-no   ROS: All systems reviewed and negative except as per HPI.   Current Outpatient Prescriptions  Medication Sig Dispense Refill  .  ALPRAZolam (XANAX) 0.5 MG tablet Take 0.5 mg by mouth 3 (three) times daily as needed.      Marland Kitchen aspirin 81 MG tablet Take 81 mg by mouth daily.      . carvedilol (COREG) 6.25 MG tablet TAKE 1 TABLET BY MOUTH 2 (TWO) TIMES DAILY. 180 tablet 3  . clonazePAM (KLONOPIN) 1 MG tablet Take 1 mg by mouth daily as needed.     . cyclobenzaprine (FLEXERIL) 10 MG tablet TAKE 1 TABLET (10 MG  TOTAL) BY MOUTH AS NEEDED. 30 tablet 5  . doxepin (SINEQUAN) 10 MG capsule Take 1 capsule (10 mg total) by mouth at bedtime as needed. 30 capsule 5  . omeprazole (PRILOSEC) 40 MG capsule Take 1 capsule (40 mg total) by mouth daily. 90 capsule 3  . simvastatin (ZOCOR) 20 MG tablet Take 1 tablet (20 mg total) by mouth at bedtime. 90 tablet 3  . thyroid (ARMOUR THYROID) 15 MG tablet TAKE 1 TABLET (15 MG TOTAL) BY MOUTH DAILY. 90 tablet 3  . thyroid (ARMOUR THYROID) 60 MG tablet TAKE 1 TABLET (60 MG TOTAL) BY MOUTH DAILY BEFORE BREAKFAST. 90 tablet 3  . Topiramate (TOPAMAX PO) Take by mouth.    . Vitamin D, Ergocalciferol, (DRISDOL) 50000 units CAPS capsule Take 1 capsule (50,000 Units total) by mouth every 7 (seven) days. 30 capsule 1  . furosemide (LASIX) 20 MG tablet Take 1 tablet (20 mg total) by mouth daily. 30 tablet 3  . potassium chloride (K-DUR) 10 MEQ tablet Take 1 tablet (10 mEq total) by mouth daily. 30 tablet 3  . sacubitril-valsartan (ENTRESTO) 24-26 MG Take 1 tablet by mouth 2 (two) times daily. 60 tablet 3   No current facility-administered medications for this encounter.    BP 140/86 mmHg  Pulse 57  Wt 159 lb 12 oz (72.462 kg)  SpO2 97% General: NAD, overweight Neck: JVP 8-9 cm, no thyromegaly or thyroid nodule.  Lungs: Clear to auscultation bilaterally with normal respiratory effort. CV: Nondisplaced PMI.  Heart regular S1/S2 with paradoxical S2 split, no S3/S4, no murmur.  Trace ankle edema.  No carotid bruit.  Normal pedal pulses.  Abdomen: Soft, nontender, no hepatosplenomegaly, no distention.  Neurologic: Alert and oriented x 3.  Psych: Normal affect. Extremities: No clubbing or cyanosis.   Assessment/Plan:  CARDIOMYOPATHY  Patient has a nonischemic cardiomyopathy that was thought to be due to uncontrolled hypothyroidism. Cardiac MRI in 2009 showed no LGE.  EF improved to 50-55% on echo in 2010. Cath in 2010 showed normal coronaries.  However, EF was back down to 30%  in 11/15 and was 35-40% most recently in 5/17.  She reports NYHA class III symptoms that have worsened over the last few months, at least mild volume overload on exam. - Stop losartan, start Entresto 24/26 bid.  - Start Lasix 20 mg daily with KCl 10 daily.   - Continue Coreg. - CXR today given cough.  - BMET/BNP today, repeat BMET in 2 wks.  - May need RHC if she does not feel better at followup.  Hypothyroidism  Follows with endocrinology.  HYPERCHOLESTEROLEMIA Check lipids on simvastatin.  PE (pulmonary embolism)   History of PE. No longer taking coumadin. She will continue ASA 81 mg daily. Suspect current episode is more likely to be CHF, but if dyspnea does not respond to treatment would think about PE workup.   Followup in 2 wks.   Marca Ancona 09/18/2015

## 2015-09-18 NOTE — Telephone Encounter (Signed)
Per discussion with Dr. Shirlee Latch, patient is to start lasix 20 mg PO daily and KCl 10 meq PO daily. [facility] [address].   Tyler Deis. Bonnye Fava, PharmD, BCPS, CPP Clinical Pharmacist Pager: [phone] Phone: [phone] 09/18/2015 12:06 PM

## 2015-09-18 NOTE — Telephone Encounter (Addendum)
Entresto PA approved by [employer] from 09/17/15 through 09/16/16. Verified [facility].   Cicero Duck K. Bonnye Fava, PharmD, BCPS, CPP Clinical Pharmacist Pager: [phone] Phone: [phone] 09/18/2015 12:02 PM

## 2015-09-18 NOTE — Telephone Encounter (Signed)
°  [name] is calling because Dr. Shirlee Latch Stated he wanted her to start with Lasik ( New Prescription ) , but it was not sent to her pharmacy .  She would like for [facility] [address].   Thanks

## 2015-10-01 ENCOUNTER — Encounter (HOSPITAL_COMMUNITY): Payer: Self-pay

## 2015-10-01 ENCOUNTER — Ambulatory Visit (HOSPITAL_COMMUNITY)
Admission: RE | Admit: 2015-10-01 | Discharge: 2015-10-01 | Disposition: A | Discharge status: Home / Self Care | Payer: Managed Care, Other (non HMO) | Source: Ambulatory Visit | Attending: Internal Medicine | Admitting: Internal Medicine

## 2015-10-01 VITALS — BP 140/72 | HR 50 | Wt 156.5 lb

## 2015-10-01 DIAGNOSIS — Z86711 Personal history of pulmonary embolism: Secondary | ICD-10-CM | POA: Insufficient documentation

## 2015-10-01 DIAGNOSIS — I2699 Other pulmonary embolism without acute cor pulmonale: Secondary | ICD-10-CM | POA: Diagnosis not present

## 2015-10-01 DIAGNOSIS — I10 Essential (primary) hypertension: Secondary | ICD-10-CM | POA: Diagnosis not present

## 2015-10-01 DIAGNOSIS — Z833 Family history of diabetes mellitus: Secondary | ICD-10-CM | POA: Insufficient documentation

## 2015-10-01 DIAGNOSIS — I5022 Chronic systolic (congestive) heart failure: Secondary | ICD-10-CM

## 2015-10-01 DIAGNOSIS — E7800 Pure hypercholesterolemia, unspecified: Secondary | ICD-10-CM | POA: Insufficient documentation

## 2015-10-01 DIAGNOSIS — R059 Cough, unspecified: Secondary | ICD-10-CM | POA: Insufficient documentation

## 2015-10-01 DIAGNOSIS — Z8249 Family history of ischemic heart disease and other diseases of the circulatory system: Secondary | ICD-10-CM | POA: Insufficient documentation

## 2015-10-01 DIAGNOSIS — E039 Hypothyroidism, unspecified: Secondary | ICD-10-CM | POA: Diagnosis not present

## 2015-10-01 DIAGNOSIS — Z7982 Long term (current) use of aspirin: Secondary | ICD-10-CM | POA: Diagnosis not present

## 2015-10-01 DIAGNOSIS — J45909 Unspecified asthma, uncomplicated: Secondary | ICD-10-CM | POA: Insufficient documentation

## 2015-10-01 DIAGNOSIS — I428 Other cardiomyopathies: Secondary | ICD-10-CM | POA: Insufficient documentation

## 2015-10-01 DIAGNOSIS — R05 Cough: Secondary | ICD-10-CM | POA: Insufficient documentation

## 2015-10-01 DIAGNOSIS — Z79899 Other long term (current) drug therapy: Secondary | ICD-10-CM | POA: Insufficient documentation

## 2015-10-01 LAB — BASIC METABOLIC PANEL
ANION GAP: 9 (ref 5–15)
BUN: 12 mg/dL (ref 6–20)
CALCIUM: 9.7 mg/dL (ref 8.9–10.3)
CHLORIDE: 105 mmol/L (ref 101–111)
CO2: 23 mmol/L (ref 22–32)
Creatinine, Ser: 0.91 mg/dL (ref 0.44–1.00)
GFR calc non Af Amer: >60 mL/min (ref >60)
GLUCOSE: 97 mg/dL (ref 65–99)
Potassium: 4.4 mmol/L (ref 3.5–5.1)
Sodium: 137 mmol/L (ref 135–145)

## 2015-10-01 LAB — BRAIN NATRIURETIC PEPTIDE: B Natriuretic Peptide: 63.8 pg/mL (ref 0.0–100.0)

## 2015-10-01 LAB — D-DIMER, QUANTITATIVE (NOT AT ARMC): D DIMER QUANT: 0.49 ug{FEU}/mL (ref 0.00–0.50)

## 2015-10-01 MED ORDER — FUROSEMIDE 40 MG PO TABS: 40.0000 mg | ORAL_TABLET | Freq: Every day | ORAL | Status: DC

## 2015-10-01 MED ORDER — SPIRONOLACTONE 25 MG PO TABS
12.5000 mg | ORAL_TABLET | Freq: Every day | ORAL | Status: DC
Start: 2015-10-01 — End: 2015-10-23

## 2015-10-01 MED ORDER — ALBUTEROL SULFATE HFA 108 (90 BASE) MCG/ACT IN AERS: 2.0000 | INHALATION_SPRAY | Freq: Four times a day (QID) | RESPIRATORY_TRACT | Status: DC | PRN

## 2015-10-01 NOTE — Patient Instructions (Signed)
Routine lab work today. Will notify you of abnormal results  Increase your lasix to 40mg  daily.  Start Spironolactone 12.5mg  (1/2 tablet) daily  Start Albuterol inhaler two puffs 3 times daily as needed.  You have been referred to Dr.Clinton Young  Repeat labs in 10days  Follow up with Dr.McLean in 3-4 weeks

## 2015-10-01 NOTE — Addendum Note (Signed)
Encounter addended by: Laurey Morale, MD on: 10/01/2015 10:30 PM<BR>     Documentation filed: Notes Section

## 2015-10-01 NOTE — Progress Notes (Addendum)
Patient ID: Stacy Mcintosh, female   DOB: 1955/11/10, 60 y.o.   MRN: [id number] PCP: Porfirio O[facility] Cardiology: Dr. Shirlee Latch  60 yo with history of hypothyroidism, nonischemic cardiomyopathy, and PE presents for cardiology followup.  Hypothyroidism has been controlled by Armour thyroid.  She is now seeing Dr. Lucianne Muss for endocrine management.  He last echo in 5/17 showed EF 35-40% with diffuse hypokinesis.    For the last few months, she has been short of breath with exertion.  Dyspnea after walking 40-50 feet or walking up steps.  Severe fatigue, chronic cough.  No chest pain.  No orthopnea/PND.  Weight is not significantly increased.   At last appointment, I started her on low dose Lasix and Entresto.  Weight is down 3 lbs.  Symptoms have really not changed much, still with the same dyspnea, fatigue, and cough.  Of note, she has a history of asthma and is not using and inhaler.    ECG: NSR, LBBB  Labs (10/10): BNP 13, K 3.7, creatinine 0.8 Labs (7/11): K 4.1, creatinine 0.6 Labs (7/12): LDL 143, HDL 74, K 3.3, creatinine 1.0, TSH normal Labs (12/12): K 4, creatinine 0.8, HDL 85, LDL 107 Labs (11/15): K 4.2, creatinine 0.74, LDL 78, HDL 76 Labs (4/17): TSH normal, K 4.5, creatinine 0.92, HCT 42.2 Labs (5/17): LDL 105, HDL 72, BNP 261  Allergies (verified):  1)  ! Lisinopril  Past Medical History: 1. Hypothyroidism and history of Graves disease.  She is status post radioactive iodine therapy.  She then developed hypothyroidism, and the hypothyroidism has been difficult to control.   2. Cardiomyopathy, nonischemic.  The patient had a cardiac MRI done during her hospitalization in July 2009.  This showed moderate LV enlargement and global LV hypokinesis.  There was a septal paradox pattern.  EF was calculated to be 27%.  There was mild left atrial enlargement.  The RV was normal in size and function.  There was no ASD or VSD.  There was no evidence for delayed enhancement pattern  in the LV myocardium  suggesting that there was no scar from coronary artery disease and no definite evidence for infiltrative cardiomyopathy.  HIV was negative.  She did have an echo done also in the hospital in July 2009 that showed severe mitral regurgitation.  A TEE done following this showed an EF of 25-30% with severe mitral regurgitation.  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  The patient did have a left and right heart catheterization after treatment for congestive heart failure in January 2010.  There was no angiographic coronary disease.  EF had improved to 55-60% with no significant mitral  regurgitation.  The hemodynamics were totally normal on the rightheart catheterization.  The cardiomyopathy, I think, was likely du eto poorly controlled hypothyroidism.   - Echo (8/10): EF 50-55%, mild LV dilatation, mild-mod MR, PASP 32 - Echo (11/15): EF 30% - Echo (5/17): EF 35-40%, mild-moderate TR, moderate MR, PASP 50 mmHg.  3. PE/DVT.  July 2009.  The patient had been very sedentary prior to this due to her severe hypothyroidism.  She had  normal antithrombin III.  She was negative for factor V Leiden.  She had normal protein C and protein S function.  Now off coumadin.  4. Hypertension. 5. History of left bundle-branch block. 6. Asthma. 7. Migraines. 8. ACEI cough 9. Low back pain: L-spine disease.  10. Lyme disease: Treated in 12/16.   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   Current Outpatient Prescriptions  Medication Sig Dispense Refill  . ALPRAZolam (XANAX) 0.5 MG tablet Take 0.5 mg by mouth 3 (three) times daily as needed.       Marland Kitchen aspirin 81 MG tablet Take 81 mg by mouth daily.      . carvedilol (COREG) 6.25 MG tablet TAKE 1 TABLET BY MOUTH 2 (TWO) TIMES DAILY. 180 tablet 3  . clonazePAM (KLONOPIN) 1 MG tablet Take 1 mg by mouth daily as needed.     . cyclobenzaprine (FLEXERIL) 10 MG tablet TAKE 1 TABLET (10 MG TOTAL) BY MOUTH AS NEEDED. 30 tablet 5  . doxepin (SINEQUAN) 10 MG capsule Take 1 capsule (10 mg total) by mouth at bedtime as needed. 30 capsule 5  . furosemide (LASIX) 40 MG tablet Take 1 tablet (40 mg total) by mouth daily. 30 tablet 3  . omeprazole (PRILOSEC) 40 MG capsule Take 1 capsule (40 mg total) by mouth daily. 90 capsule 3  . potassium chloride (K-DUR) 10 MEQ tablet Take 1 tablet (10 mEq total) by mouth daily. 30 tablet 3  . sacubitril-valsartan (ENTRESTO) 24-26 MG Take 1 tablet by mouth 2 (two) times daily. 60 tablet 3  . simvastatin (ZOCOR) 20 MG tablet Take 1 tablet (20 mg total) by mouth at bedtime. 90 tablet 3  . thyroid (ARMOUR THYROID) 15 MG tablet TAKE 1 TABLET (15 MG TOTAL) BY MOUTH DAILY. 90 tablet 3  . thyroid (ARMOUR THYROID) 60 MG tablet TAKE 1 TABLET (60 MG TOTAL) BY MOUTH DAILY BEFORE BREAKFAST. 90 tablet 3  . Topiramate (TOPAMAX PO) Take by mouth.    . Vitamin D, Ergocalciferol, (DRISDOL) 50000 units CAPS capsule Take 1 capsule (50,000 Units total) by mouth every 7 (seven) days. 30 capsule 1  . albuterol (PROVENTIL HFA;VENTOLIN HFA) 108 (90 Base) MCG/ACT inhaler Inhale 2 puffs into the lungs every 6 (six) hours as needed for wheezing or shortness of breath. 1 Inhaler 2  . spironolactone (ALDACTONE) 25 MG tablet Take 0.5 tablets (12.5 mg total) by mouth daily. 15 tablet 3   No current facility-administered medications for this encounter.    BP 140/72 mmHg  Pulse 50  Wt 156 lb 8 oz (70.988 kg)  SpO2 98% General: NAD Neck: JVP 7-8 cm, no thyromegaly or thyroid nodule.  Lungs: Clear to auscultation bilaterally with normal respiratory effort. CV: Nondisplaced PMI.  Heart regular  S1/S2 with paradoxical S2 split, no S3/S4, no murmur.  Trace ankle edema.  No carotid bruit.  Normal pedal pulses.  Abdomen: Soft, nontender, no hepatosplenomegaly, no distention.  Neurologic: Alert and oriented x 3.  Psych: Normal affect. Extremities: No clubbing or cyanosis.   Assessment/Plan:  CARDIOMYOPATHY  Patient has a nonischemic cardiomyopathy that was thought to be due to uncontrolled hypothyroidism. Cardiac MRI in 2009 showed no LGE.  EF improved to 50-55% on echo in 2010. Cath in 2010 showed normal coronaries.  However, EF was back down to 30% in 11/15 and was 35-40% most recently in 5/17.  She reports NYHA class III symptoms that have worsened over the last few months.  At last appointment, Lasix was started and weight is down, but she does not feel better. - Continue Entresto 24/26 bid.  -  I will try increasing Lasix to 40 mg daily.   - Continue Coreg. - Add spironolactone 12.5 mg daily.   - BMET/BNP today, repeat BMET in 10 days.  - Followup in 3-4 weeks, will do RHC if no improvement.  Hypothyroidism  Follows with endocrinology. Last TSH was normal. HYPERCHOLESTEROLEMIA Good lipids recently.   PE (pulmonary embolism)   History of PE. No longer taking coumadin. She will continue ASA 81 mg daily. Suspect current episode is more likely to be CHF, but given persistent dyspnea, will send D Dimer.  If negative, no further workup.  Cough Chronic, very bothersome cough.  I wonder if the cough and some of her dyspnea is not related to poorly controlled asthma.  She carries and asthma diagnosis but has not been on any inhalers.   - I will give her an albuterol inhaler and arrange for followup with Dr Maple Hudson, her pulmonologist.   Followup in 3-4 wks.   Marca Ancona 10/01/2015

## 2015-10-19 ENCOUNTER — Ambulatory Visit (HOSPITAL_COMMUNITY)
Admission: RE | Admit: 2015-10-19 | Discharge: 2015-10-19 | Disposition: A | Discharge status: Home / Self Care | Payer: Managed Care, Other (non HMO) | Source: Ambulatory Visit | Attending: Cardiology | Admitting: Cardiology

## 2015-10-19 DIAGNOSIS — I5022 Chronic systolic (congestive) heart failure: Secondary | ICD-10-CM | POA: Insufficient documentation

## 2015-10-19 LAB — BASIC METABOLIC PANEL
Anion gap: 9 (ref 5–15)
BUN: 15 mg/dL (ref 6–20)
CALCIUM: 10.2 mg/dL (ref 8.9–10.3)
CHLORIDE: 101 mmol/L (ref 101–111)
CO2: 29 mmol/L (ref 22–32)
Creatinine, Ser: 1.05 mg/dL — ABNORMAL HIGH (ref 0.44–1.00)
GFR, EST NON AFRICAN AMERICAN: 57 mL/min — AB (ref >60)
Glucose, Bld: 103 mg/dL — ABNORMAL HIGH (ref 65–99)
Potassium: 3.7 mmol/L (ref 3.5–5.1)
SODIUM: 139 mmol/L (ref 135–145)

## 2015-10-19 NOTE — Progress Notes (Signed)
Pt wanted to reports during her lab appointment that she is taking whole tablet of spiro as opposed to half tab  Also patient is requesting a letter for clearance to begin EMDR therapy- to assist with trauma

## 2015-10-22 ENCOUNTER — Telehealth (HOSPITAL_COMMUNITY): Payer: Self-pay | Admitting: Cardiology

## 2015-10-22 NOTE — Telephone Encounter (Signed)
-----   Message from Laurey Morale, MD sent at 10/22/2015  2:11 PM EDT ----- EMDR therapy ok, continue whole spironolactone ----- Message -----    From: Theresia Bough, CMA    Sent: 10/20/2015  10:42 AM      To: Laurey Morale, MD  While reviewing labs foe this patient patient reported she is taking a whole tab of spiro instead of half tab as ordered.  Also patient is requesting a letter of clearance, states she would like to begin EMDR therapy to assist with trauma recovery

## 2015-10-23 MED ORDER — SPIRONOLACTONE 25 MG PO TABS: 25.0000 mg | ORAL_TABLET | Freq: Every day | ORAL | Status: DC

## 2015-10-23 NOTE — Telephone Encounter (Signed)
Patient aware and voiced understanding

## 2015-11-03 ENCOUNTER — Encounter (HOSPITAL_COMMUNITY): Payer: Managed Care, Other (non HMO)

## 2015-11-06 ENCOUNTER — Encounter: Payer: Self-pay | Admitting: Pulmonary Disease

## 2015-11-06 ENCOUNTER — Ambulatory Visit (INDEPENDENT_AMBULATORY_CARE_PROVIDER_SITE_OTHER): Payer: Managed Care, Other (non HMO) | Admitting: Pulmonary Disease

## 2015-11-06 DIAGNOSIS — I2699 Other pulmonary embolism without acute cor pulmonale: Secondary | ICD-10-CM

## 2015-11-06 DIAGNOSIS — R05 Cough: Secondary | ICD-10-CM | POA: Diagnosis not present

## 2015-11-06 DIAGNOSIS — R0600 Dyspnea, unspecified: Secondary | ICD-10-CM | POA: Insufficient documentation

## 2015-11-06 DIAGNOSIS — R059 Cough, unspecified: Secondary | ICD-10-CM

## 2015-11-06 MED ORDER — BUDESONIDE-FORMOTEROL FUMARATE 80-4.5 MCG/ACT IN AERO: 2.0000 | INHALATION_SPRAY | Freq: Two times a day (BID) | RESPIRATORY_TRACT | Status: DC

## 2015-11-06 NOTE — Patient Instructions (Addendum)
It is nice to meet you today. We will schedule you for pulmonary functions tests today. We will start you on Symbicort . Take 2 puffs twice daily every day. This is your maintenance medication. Rinse your mouth after each administration of drug.. Follow up with Dr. Isaiah Serge in 1-2 months after PFT's. Please contact office for sooner follow up if symptoms do not improve or worsen or seek emergency care

## 2015-11-06 NOTE — Assessment & Plan Note (Signed)
Dyspnea on exertion Plan: We will schedule you for pulmonary functions tests today. We will start you on Symbicort . Take 2 puffs twice daily every day. This is your maintenance medication. Rinse your mouth after each administration of drug.. Follow up with Dr. Isaiah Serge in 1-2 months after PFT's. Please contact office for sooner follow up if symptoms do not improve or worsen or seek emergency care

## 2015-11-06 NOTE — Progress Notes (Signed)
History of Present Illness Stacy Mcintosh is a 60 y.o. female never smoker with history of provoked PE in 2009 and some remote seasonal allergies.   11/06/2015 Consultation for Dyspnea:  60 yo with history of hypothyroidism, nonischemic cardiomyopathy, and PE presents for Pulmonary evaluation.for dyspnea with exertion.Past medical history includes  Hypothyroidism has been controlled by Armour thyroid. She is now seeing Dr. Lucianne Muss for endocrine management. He last echo in 5/17 showed EF 35-40% with diffuse hypokinesis. For the last few months, she has been short of breath with exertion. Dyspnea after walking 40-50 feet or walking up steps. Severe fatigue, chronic cough. No chest pain. No orthopnea/PND. Weight is not significantly increased. She was started on Lasix and Entresto by cardiology. The cough has gone, but the shortness of breath with exertion has not resolved. It is made worse by the heat.She is not using her rescue inhaler due to it causing anxiety.She is not on any maintenance medication for her respiratory status.. She had wheezing about 2-3 months ago which has now resolved.Marland KitchenShe was diagnosed with Lyme Disease in October 2016. She states she did have some dyspnea in 2008 and was treated with Breo  (?), which did not work well for her. She denies any recent fever. She is a Teacher, adult education.    Tests 09/19/2015 IMPRESSION: 1. At least mildly hyperexpanded lungs suggesting some degree of COPD. Suspect associated chronic bronchitic changes centrally. 2. No acute findings. No evidence of pneumonia.  ECG: NSR, LBBB  Labs (10/10): BNP 13, K 3.7, creatinine 0.8 Labs (7/11): K 4.1, creatinine 0.6 Labs (7/12): LDL 143, HDL 74, K 3.3, creatinine 1.0, TSH normal Labs (12/12): K 4, creatinine 0.8, HDL 85, LDL 107 Labs (11/15): K 4.2, creatinine 0.74, LDL 78, HDL 76 Labs (4/17): TSH normal, K 4.5, creatinine 0.92, HCT 42.2 Labs (5/17): LDL 105, HDL 72, BNP 261  Allergies  (verified):  1) ! Lisinopril  Echo (8/10): EF 50-55%, mild LV dilatation, mild-mod MR, PASP 32 - Echo (11/15): EF 30% - Echo (5/17): EF 35-40%, mild-moderate TR, moderate MR, PASP 50 mmHg.  3. PE/DVT. July 2009. The patient had been very sedentary prior to this due to her severe hypothyroidism. She had normal antithrombin III. She was negative for factor V Leiden. She had normal protein C and protein S function. Now off coumadin.  Past medical hx Past Medical History  Diagnosis Date  . Arthritis   . Depression   . Hx of migraines   . Pure hypercholesterolemia 11/18/2008  . CARDIOMYOPATHY 11/12/2008  . HYPERTENSION 10/16/2007  . Other and unspecified hyperlipidemia 01/19/2010  . BUNDLE BRANCH BLOCK, LEFT 11/12/2008  . ASTHMA 10/16/2007  . Hypothyroidism     h/o Graves Disease  . PE (pulmonary embolism) 2009    Factor V negative  . Anxiety   . CHF (congestive heart failure) (HCC)     a.Echo 5/17: EF 35-40%, moderate MR, mild LAE, mild to moderate TR, PASP 50 mmHg     Past surgical hx, Family hx, Social hx all reviewed.  Current Outpatient Prescriptions on File Prior to Visit  Medication Sig  . ALPRAZolam (XANAX) 0.5 MG tablet Take 0.5 mg by mouth 3 (three) times daily as needed.    Marland Kitchen aspirin 81 MG tablet Take 81 mg by mouth daily.    . carvedilol (COREG) 6.25 MG tablet TAKE 1 TABLET BY MOUTH 2 (TWO) TIMES DAILY.  . clonazePAM (KLONOPIN) 1 MG tablet Take 1 mg by mouth daily as needed.   Marland Kitchen  cyclobenzaprine (FLEXERIL) 10 MG tablet TAKE 1 TABLET (10 MG TOTAL) BY MOUTH AS NEEDED.  Marland Kitchen doxepin (SINEQUAN) 10 MG capsule Take 1 capsule (10 mg total) by mouth at bedtime as needed.  . furosemide (LASIX) 40 MG tablet Take 1 tablet (40 mg total) by mouth daily.  Marland Kitchen omeprazole (PRILOSEC) 40 MG capsule Take 1 capsule (40 mg total) by mouth daily.  . potassium chloride (K-DUR) 10 MEQ tablet Take 1 tablet (10 mEq total) by mouth daily.  . sacubitril-valsartan (ENTRESTO) 24-26 MG Take 1 tablet  by mouth 2 (two) times daily.  . simvastatin (ZOCOR) 20 MG tablet Take 1 tablet (20 mg total) by mouth at bedtime.  Marland Kitchen spironolactone (ALDACTONE) 25 MG tablet Take 1 tablet (25 mg total) by mouth daily.  Marland Kitchen thyroid (ARMOUR THYROID) 15 MG tablet TAKE 1 TABLET (15 MG TOTAL) BY MOUTH DAILY.  Marland Kitchen thyroid (ARMOUR THYROID) 60 MG tablet TAKE 1 TABLET (60 MG TOTAL) BY MOUTH DAILY BEFORE BREAKFAST.  Marland Kitchen Vitamin D, Ergocalciferol, (DRISDOL) 50000 units CAPS capsule Take 1 capsule (50,000 Units total) by mouth every 7 (seven) days.  Marland Kitchen albuterol (PROVENTIL HFA;VENTOLIN HFA) 108 (90 Base) MCG/ACT inhaler Inhale 2 puffs into the lungs every 6 (six) hours as needed for wheezing or shortness of breath. (Patient not taking: Reported on 11/06/2015)   No current facility-administered medications on file prior to visit.     Allergies  Allergen Reactions  . Augmentin [Amoxicillin-Pot Clavulanate] Diarrhea    With yeast infection  . Lisinopril Cough   Social History   Social History  . Marital Status: Married    Spouse Name: N/A  . Number of Children: 3  . Years of Education: College   Occupational History  . Massage Therapist    Social History Main Topics  . Smoking status: Never Smoker   . Smokeless tobacco: Never Used  . Alcohol Use: 0.0 oz/week    0 Standard drinks or equivalent per week     Comment: rare 3  . Drug Use: No  . Sexual Activity: Not Currently   Other Topics Concern  . None   Social History Narrative   Married   Massage therapist   Wine 2-4 times per week > mostly red   Recently traveled to [place] x1 week in April 2017    Family History  Problem Relation Age of Onset  . Heart attack Mother   . Hypertension Mother   . Rheumatologic disease Mother   . Ovarian cancer Mother   . Prostate cancer Father   . Arthritis Sister   . Hypertension Sister   . Thyroid disease Brother     Parathyroid problem  . Diabetes Brother   . Heart attack Brother   . Sudden death Maternal  Grandfather     Review Of Systems:  Constitutional:   No  weight loss, night sweats,  Fevers, chills, fatigue, or  lassitude.  HEENT:   + headaches,  Difficulty swallowing,  Tooth/dental problems, or  Sore throat,                No sneezing, itching, ear ache, nasal congestion, post nasal drip,   CV:  + chest pain,  Orthopnea, PND, swelling in lower extremities, anasarca, dizziness, palpitations, syncope.   GI  No heartburn, indigestion, abdominal pain, nausea, vomiting, diarrhea, change in bowel habits, +loss of appetite, bloody stools.   Resp: + shortness of breath with exertion +  at rest.  No excess mucus currently, No productive cough,  +  non-productive cough,  No coughing up of blood.  No change in color of mucus.  No wheezing.  No chest wall deformity  Skin: no rash or lesions.  GU: no dysuria, change in color of urine, no urgency or frequency.  No flank pain, no hematuria   MS:  No joint pain or swelling.  No decreased range of motion.  No back pain.  Psych:  No change in mood or affect. No depression or anxiety.  No memory loss.   Vital Signs BP 134/82 mmHg  Pulse 49  Ht 5' (1.524 m)  Wt 152 lb 12.8 oz (69.31 kg)  BMI 29.84 kg/m2  SpO2 98%   Physical Exam:  General- No distress,  A&Ox3, pleasant ENT: No sinus tenderness, TM clear, pale nasal mucosa, no oral exudate,no post nasal drip, no LAN, +hoarse voice Cardiac: S1, S2, regular rate and rhythm, no murmur Chest: No wheeze/ rales/ dullness; no accessory muscle use, no nasal flaring, no sternal retractions Abd.: Soft Non-tender Ext: No clubbing cyanosis, edema Neuro:  normal strength Skin: No rashes, warm and dry Psych: normal mood and behavior   Assessment/Plan  Dyspnea Dyspnea on exertion Plan: We will schedule you for pulmonary functions tests today. We will start you on Symbicort . Take 2 puffs twice daily every day. This is your maintenance medication. Rinse your mouth after each administration of  drug.. Follow up with Dr. Isaiah Serge in 1-2 months after PFT's. Please contact office for sooner follow up if symptoms do not improve or worsen or seek emergency care        Bevelyn Ngo, NP 11/06/2015  4:45 PM   Attending note: I have seen and examined the patient with nurse practitioner/resident and agree with the note. History, labs and imaging reviewed.  Chilton Greathouse MD [facility] Pager [phone] If no answer or after 3pm call: [phone] 11/10/2015, 1:52 PM

## 2015-11-09 ENCOUNTER — Encounter (HOSPITAL_COMMUNITY): Payer: Self-pay

## 2015-11-09 ENCOUNTER — Ambulatory Visit (HOSPITAL_COMMUNITY)
Admission: RE | Admit: 2015-11-09 | Discharge: 2015-11-09 | Disposition: A | Discharge status: Home / Self Care | Payer: Managed Care, Other (non HMO) | Source: Ambulatory Visit | Attending: Cardiology | Admitting: Cardiology

## 2015-11-09 VITALS — BP 150/80 | HR 50 | Wt 152.8 lb

## 2015-11-09 DIAGNOSIS — E0500 Thyrotoxicosis with diffuse goiter without thyrotoxic crisis or storm: Secondary | ICD-10-CM | POA: Insufficient documentation

## 2015-11-09 DIAGNOSIS — I447 Left bundle-branch block, unspecified: Secondary | ICD-10-CM | POA: Insufficient documentation

## 2015-11-09 DIAGNOSIS — I5022 Chronic systolic (congestive) heart failure: Secondary | ICD-10-CM | POA: Diagnosis present

## 2015-11-09 DIAGNOSIS — E039 Hypothyroidism, unspecified: Secondary | ICD-10-CM | POA: Diagnosis not present

## 2015-11-09 DIAGNOSIS — I428 Other cardiomyopathies: Secondary | ICD-10-CM | POA: Insufficient documentation

## 2015-11-09 DIAGNOSIS — I110 Hypertensive heart disease with heart failure: Secondary | ICD-10-CM | POA: Insufficient documentation

## 2015-11-09 LAB — BASIC METABOLIC PANEL
ANION GAP: 8 (ref 5–15)
BUN: 12 mg/dL (ref 6–20)
CALCIUM: 9.9 mg/dL (ref 8.9–10.3)
CO2: 23 mmol/L (ref 22–32)
Chloride: 104 mmol/L (ref 101–111)
Creatinine, Ser: 1.03 mg/dL — ABNORMAL HIGH (ref 0.44–1.00)
GFR calc non Af Amer: 58 mL/min — ABNORMAL LOW (ref >60)
Glucose, Bld: 105 mg/dL — ABNORMAL HIGH (ref 65–99)
Potassium: 4.1 mmol/L (ref 3.5–5.1)
Sodium: 135 mmol/L (ref 135–145)

## 2015-11-09 LAB — BRAIN NATRIURETIC PEPTIDE: B Natriuretic Peptide: 22.8 pg/mL (ref 0.0–100.0)

## 2015-11-09 MED ORDER — SACUBITRIL-VALSARTAN 49-51 MG PO TABS: 1.0000 | ORAL_TABLET | Freq: Two times a day (BID) | ORAL | Status: DC

## 2015-11-09 NOTE — Patient Instructions (Signed)
STOP Symbicort inhaler.  INCREASE Entresto to 49/51 mg tablet twice daily.  Routine lab work today. Will notify you of abnormal results, otherwise no news is good news!  Return in 1-2 weeks for repeat labs.  Follow up 1 month with Dr. Shirlee Latch.  Do the following things EVERYDAY: 1) Weigh yourself in the morning before breakfast. Write it down and keep it in a log. 2) Take your medicines as prescribed 3) Eat low salt foods-Limit salt (sodium) to 2000 mg per day.  4) Stay as active as you can everyday 5) Limit all fluids for the day to less than 2 liters

## 2015-11-09 NOTE — Progress Notes (Signed)
Advanced Heart Failure Medication Review by a Pharmacist  Does the patient  feel that his/her medications are working for him/her?  yes  Has the patient been experiencing any side effects to the medications prescribed?  Yes - hoarseness and more SOB with Symbicort use  Does the patient measure his/her own blood pressure or blood glucose at home?  no   Does the patient have any problems obtaining medications due to transportation or finances?   no  Understanding of regimen: good Understanding of indications: good Potential of compliance: good Patient understands to avoid NSAIDs. Patient understands to avoid decongestants.  Issues to address at subsequent visits: None   Pharmacist comments:  Stacy Mcintosh is a pleasant 60 yo F presenting without a medication list but with good recall of her regimen. She reports good compliance with her regimen but states that since Symbicort was prescribed for her late last week, she has become more SOB and hoarse. She did not have any other medication-related questions or concerns for me at this time.   Tyler Deis. Bonnye Fava, PharmD, BCPS, CPP Clinical Pharmacist Pager: [phone] Phone: [phone] 11/09/2015 11:51 AM      Time with patient: 10 minutes Preparation and documentation time: 2 minutes Total time: 12 minutes

## 2015-11-10 NOTE — Progress Notes (Signed)
Patient ID: Stacy Mcintosh, female   DOB: [birth year], 60 y.o.   MRN: [id number] PCP: Porfir[facility] Oar Cardiology: Dr. Shirlee Latch  60 yo with history of hypothyroidism, nonischemic cardiomyopathy, and PE presents for cardiology followup.  Hypothyroidism has been controlled by [facility] thyroid.  She is now seeing Dr. Lucianne Muss for endocrine management.  He last echo in 5/17 showed EF 35-40% with diffuse hypokinesis.    For the last few months, she has been short of breath with exertion.  I have adjusted her cardiac meds and started her on Lasix.  Weight is down 4 lbs since last appointment.    Since I adjusted her meds, she started feeling much better.  She went to see pulmonary and was started on treatment for possible asthma with Symbicort (end of last week).  Every since starting Symbicort, she has become significantly more short of breath and hoarse.  She is wheezing on occasion.  She is now short of breath after walking 50-100 feet.    ECG: NSR, LBBB  Labs (10/10): BNP 13, K 3.7, creatinine 0.8 Labs (7/11): K 4.1, creatinine 0.6 Labs (7/12): LDL 143, HDL 74, K 3.3, creatinine 1.0, TSH normal Labs (12/12): K 4, creatinine 0.8, HDL 85, LDL 107 Labs (11/15): K 4.2, creatinine 0.74, LDL 78, HDL 76 Labs (4/17): TSH normal, K 4.5, creatinine 0.92, HCT 42.2 Labs (5/17): LDL 105, HDL 72, BNP 261 Labs (6/17): D dimer negative, BNP 64  Allergies (verified):  1)  ! Lisinopril  Past Medical History: 1. Hypothyroidism and history of Graves disease.  She is status post radioactive iodine therapy.  She then developed hypothyroidism, and the hypothyroidism has been difficult to control.   2. Cardiomyopathy, nonischemic.  The patient had a cardiac MRI done during her hospitalization in July 2009.  This showed moderate LV enlargement and global LV hypokinesis.  There was a septal paradox pattern.  EF was calculated to be 27%.  There was mild left atrial enlargement.  The RV was normal in size and function.  There was no  ASD or VSD.  There was no evidence for delayed enhancement pattern  in the LV myocardium suggesting that there was no scar from coronary artery disease and no definite evidence for infiltrative cardiomyopathy.  HIV was negative.  She did have an echo done also in the hospital in July 2009 that showed severe mitral regurgitation.  A TEE done following this showed an EF of 25-30% with severe mitral regurgitation.  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  The patient did have a left and right heart catheterization after treatment for congestive heart failure in January 2010.  There was no angiographic coronary disease.  EF had improved to 55-60% with no significant mitral  regurgitation.  The hemodynamics were totally normal on the rightheart catheterization.  The cardiomyopathy, I think, was likely du eto poorly controlled hypothyroidism.   - Echo (8/10): EF 50-55%, mild LV dilatation, mild-mod MR, PASP 32 - Echo (11/15): EF 30% - Echo (5/17): EF 35-40%, mild-moderate TR, moderate MR, PASP 50 mmHg.  3. PE/DVT.  July 2009.  The patient had been very sedentary prior to this due to her severe hypothyroidism.  She had  normal antithrombin III.  She was negative for factor V Leiden.  She had normal protein C and protein S function.  Now off coumadin.  4. Hypertension. 5. History of left bundle-branch block. 6. Asthma. 7. Migraines. 8. ACEI cough 9. Low back pain: L-spine disease.  10.  Lyme disease: Treated in 12/16.   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   Current Outpatient Prescriptions  Medication Sig Dispense Refill  .  ALPRAZolam (XANAX) 0.5 MG tablet Take 0.5 mg by mouth 3 (three) times daily as needed for anxiety.     Marland Kitchen aspirin 81 MG tablet Take 81 mg by mouth daily.      . carvedilol (COREG) 6.25 MG tablet TAKE 1 TABLET BY MOUTH 2 (TWO) TIMES DAILY. 180 tablet 3  . clonazePAM (KLONOPIN) 1 MG tablet Take 1 mg by mouth at bedtime as needed for anxiety.     . cyclobenzaprine (FLEXERIL) 10 MG tablet TAKE 1 TABLET (10 MG TOTAL) BY MOUTH AS NEEDED. 30 tablet 5  . doxepin (SINEQUAN) 10 MG capsule Take 10 mg by mouth at bedtime as needed (sleep).    . furosemide (LASIX) 40 MG tablet Take 1 tablet (40 mg total) by mouth daily. 30 tablet 3  . omeprazole (PRILOSEC) 40 MG capsule Take 1 capsule (40 mg total) by mouth daily. 90 capsule 3  . potassium chloride (K-DUR) 10 MEQ tablet Take 1 tablet (10 mEq total) by mouth daily. 30 tablet 3  . simvastatin (ZOCOR) 20 MG tablet Take 1 tablet (20 mg total) by mouth at bedtime. 90 tablet 3  . spironolactone (ALDACTONE) 25 MG tablet Take 1 tablet (25 mg total) by mouth daily. 30 tablet 3  . thyroid (ARMOUR THYROID) 15 MG tablet TAKE 1 TABLET (15 MG TOTAL) BY MOUTH DAILY. 90 tablet 3  . thyroid (ARMOUR THYROID) 60 MG tablet TAKE 1 TABLET (60 MG TOTAL) BY MOUTH DAILY BEFORE BREAKFAST. 90 tablet 3  . Vitamin D, Ergocalciferol, (DRISDOL) 50000 units CAPS capsule Take 1 capsule (50,000 Units total) by mouth every 7 (seven) days. 30 capsule 1  . albuterol (PROVENTIL HFA;VENTOLIN HFA) 108 (90 Base) MCG/ACT inhaler Inhale 2 puffs into the lungs every 6 (six) hours as needed for wheezing or shortness of breath. (Patient not taking: Reported on 11/06/2015) 1 Inhaler 2  . sacubitril-valsartan (ENTRESTO) 49-51 MG Take 1 tablet by mouth 2 (two) times daily. 60 tablet 6   No current facility-administered medications for this encounter.    BP 150/80 mmHg  Pulse 50  Wt 152 lb 12 oz (69.287 kg)  SpO2 98% General: NAD Neck: JVP 7 cm, no thyromegaly or thyroid nodule.  Lungs: Prolonged  expiratory phase.  CV: Nondisplaced PMI.  Heart regular S1/S2 with paradoxical S2 split, no S3/S4, no murmur.  No edema.  No carotid bruit.  Normal pedal pulses.  Abdomen: Soft, nontender, no hepatosplenomegaly, no distention.  Neurologic: Alert and oriented x 3.  Psych: Normal affect. Extremities: No clubbing or cyanosis.   Assessment/Plan:  CARDIOMYOPATHY  Patient has a nonischemic cardiomyopathy that was thought to be due to uncontrolled hypothyroidism. Cardiac MRI in 2009 showed no LGE.  EF improved to 50-55% on echo in 2010. Cath in 2010 showed normal coronaries.  However, EF was back down to 30% in 11/15 and was 35-40% most recently in 5/17.  She reports NYHA class III symptoms that have worsened over the last few months.  She is now on Lasix and weight is down.  Initially, she was feeling better but  has become worse since starting Symbicort.  She is not volume overloaded on exam.   - Increase Entresto to 49/51 bid. BMET/BNP today and again in 10 days.  - Continue Lasix 40 mg daily.    - Continue Coreg. - Continue spironolactone 25 mg daily.   Hypothyroidism  Follows with endocrinology. Last TSH was normal. HYPERCHOLESTEROLEMIA Good lipids recently.   PE (pulmonary embolism)   History of PE. No longer taking coumadin. She will continue ASA 81 mg daily.  Asthma Recently started on Symbicort.  She became symptomatically worse on Symbicort.  She does not appear volume overloaded so do not think this is due to CHF.  She has already stopped Symbicort.  I recommended that she contact her pulmonologist and let him know how she felt with this medication.   Followup in 4 wks.   Marca Ancona 11/10/2015

## 2015-11-11 ENCOUNTER — Telehealth: Payer: Self-pay | Admitting: Pulmonary Disease

## 2015-11-11 NOTE — Telephone Encounter (Signed)
Per 11/06/15 OV: Patient Instructions       It is nice to meet you today. We will schedule you for pulmonary functions tests today. We will start you on Symbicort . Take 2 puffs twice daily every day. This is your maintenance medication. Rinse your mouth after each administration of drug.. Follow up with Dr. Isaiah Serge in 1-2 months after PFT's. Please contact office for sooner follow up if symptoms do not improve or worsen or seek emergency care   --  Spoke with pt. She reports she had a reaction to the symbicort. Reports c/o hoarseness, feeling very tight in chest, weak/faint feeling. She started feeling like this 1 day after starting the medication. Reports she rinsed mouth after each use. Please advise Dr. Isaiah Serge thanks

## 2015-11-11 NOTE — Telephone Encounter (Signed)
Stop inhaler. We will review what to use after she gets her PFTs

## 2015-11-11 NOTE — Telephone Encounter (Signed)
Spoke with pt and advised that she should discontinue the Symbicort. Pt states that she has not taken for several days. Advised pt that PM would discuss further at her next OV. Nothing further needed.

## 2015-11-16 ENCOUNTER — Ambulatory Visit: Payer: Managed Care, Other (non HMO) | Admitting: Cardiology

## 2015-11-23 ENCOUNTER — Ambulatory Visit (HOSPITAL_COMMUNITY)
Admission: RE | Admit: 2015-11-23 | Discharge: 2015-11-23 | Disposition: A | Discharge status: Home / Self Care | Payer: Managed Care, Other (non HMO) | Source: Ambulatory Visit | Attending: Cardiology | Admitting: Cardiology

## 2015-11-23 ENCOUNTER — Other Ambulatory Visit (HOSPITAL_COMMUNITY): Payer: Self-pay | Admitting: *Deleted

## 2015-11-23 DIAGNOSIS — I5022 Chronic systolic (congestive) heart failure: Secondary | ICD-10-CM | POA: Diagnosis not present

## 2015-11-23 LAB — BASIC METABOLIC PANEL
Anion gap: 6 (ref 5–15)
BUN: 11 mg/dL (ref 6–20)
CO2: 27 mmol/L (ref 22–32)
Calcium: 9.9 mg/dL (ref 8.9–10.3)
Chloride: 102 mmol/L (ref 101–111)
Creatinine, Ser: 0.93 mg/dL (ref 0.44–1.00)
GFR calc Af Amer: >60 mL/min (ref >60)
GLUCOSE: 110 mg/dL — AB (ref 65–99)
POTASSIUM: 4.4 mmol/L (ref 3.5–5.1)
Sodium: 135 mmol/L (ref 135–145)

## 2015-11-23 MED ORDER — SPIRONOLACTONE 25 MG PO TABS: 25.0000 mg | ORAL_TABLET | Freq: Every day | ORAL | 3 refills | Status: DC

## 2015-12-14 ENCOUNTER — Telehealth (HOSPITAL_COMMUNITY): Payer: Self-pay | Admitting: *Deleted

## 2015-12-14 DIAGNOSIS — R5383 Other fatigue: Secondary | ICD-10-CM

## 2015-12-14 NOTE — Telephone Encounter (Signed)
Pt left a VM earlier today requesting a call back.  I called pt, she c/o feeling very fatigued since [place], she states she has no energy at all, she did nothing over the weekend and was not able to go to work today.  She reports wt is stable, no edema.  She also c/o dizziness at times, and has felt like she may pass out.  She reports checking her BP which is stable, readings of 117/84, 147/86.  She denies any SOB.  Discussed all with Dr Shirlee Latch, he states since BP and SOB stable, get labs tomorrow and keep appt as sch on Fri, pt aware and agreeable, labs sch for tomorrow

## 2015-12-15 ENCOUNTER — Ambulatory Visit (HOSPITAL_COMMUNITY)
Admission: RE | Admit: 2015-12-15 | Discharge: 2015-12-15 | Disposition: A | Discharge status: Home / Self Care | Payer: Managed Care, Other (non HMO) | Source: Ambulatory Visit | Attending: Cardiology | Admitting: Cardiology

## 2015-12-15 ENCOUNTER — Encounter (HOSPITAL_COMMUNITY): Payer: Self-pay | Admitting: Cardiology

## 2015-12-15 DIAGNOSIS — R5383 Other fatigue: Secondary | ICD-10-CM | POA: Diagnosis present

## 2015-12-15 DIAGNOSIS — I5023 Acute on chronic systolic (congestive) heart failure: Secondary | ICD-10-CM | POA: Diagnosis not present

## 2015-12-15 LAB — CBC
HCT: 45.9 % (ref 36.0–46.0)
Hemoglobin: 15.2 g/dL — ABNORMAL HIGH (ref 12.0–15.0)
MCH: 30.7 pg (ref 26.0–34.0)
MCHC: 33.1 g/dL (ref 30.0–36.0)
MCV: 92.7 fL (ref 78.0–100.0)
PLATELETS: 351 10*3/uL (ref 150–400)
RBC: 4.95 MIL/uL (ref 3.87–5.11)
RDW: 13.7 % (ref 11.5–15.5)
WBC: 8 10*3/uL (ref 4.0–10.5)

## 2015-12-15 LAB — BASIC METABOLIC PANEL
Anion gap: 7 (ref 5–15)
BUN: 13 mg/dL (ref 6–20)
CO2: 27 mmol/L (ref 22–32)
CREATININE: 1.05 mg/dL — AB (ref 0.44–1.00)
Calcium: 10 mg/dL (ref 8.9–10.3)
Chloride: 102 mmol/L (ref 101–111)
GFR calc Af Amer: >60 mL/min (ref >60)
GFR, EST NON AFRICAN AMERICAN: 56 mL/min — AB (ref >60)
Glucose, Bld: 111 mg/dL — ABNORMAL HIGH (ref 65–99)
Potassium: 4.2 mmol/L (ref 3.5–5.1)
SODIUM: 136 mmol/L (ref 135–145)

## 2015-12-15 LAB — TSH: TSH: 0.329 u[IU]/mL — AB (ref 0.350–4.500)

## 2015-12-15 LAB — BRAIN NATRIURETIC PEPTIDE: B Natriuretic Peptide: 24.8 pg/mL (ref 0.0–100.0)

## 2015-12-18 ENCOUNTER — Encounter (HOSPITAL_COMMUNITY): Payer: Self-pay | Admitting: *Deleted

## 2015-12-18 ENCOUNTER — Ambulatory Visit (HOSPITAL_COMMUNITY)
Admission: RE | Admit: 2015-12-18 | Discharge: 2015-12-18 | Disposition: A | Discharge status: Home / Self Care | Payer: Managed Care, Other (non HMO) | Source: Ambulatory Visit | Attending: Cardiology | Admitting: Cardiology

## 2015-12-18 ENCOUNTER — Encounter (HOSPITAL_COMMUNITY): Payer: Self-pay

## 2015-12-18 VITALS — BP 140/70 | HR 52 | Wt 153.5 lb

## 2015-12-18 DIAGNOSIS — E039 Hypothyroidism, unspecified: Secondary | ICD-10-CM | POA: Insufficient documentation

## 2015-12-18 DIAGNOSIS — J45909 Unspecified asthma, uncomplicated: Secondary | ICD-10-CM | POA: Insufficient documentation

## 2015-12-18 DIAGNOSIS — Z7982 Long term (current) use of aspirin: Secondary | ICD-10-CM | POA: Insufficient documentation

## 2015-12-18 DIAGNOSIS — Z8619 Personal history of other infectious and parasitic diseases: Secondary | ICD-10-CM | POA: Insufficient documentation

## 2015-12-18 DIAGNOSIS — Z8249 Family history of ischemic heart disease and other diseases of the circulatory system: Secondary | ICD-10-CM | POA: Diagnosis not present

## 2015-12-18 DIAGNOSIS — Z79899 Other long term (current) drug therapy: Secondary | ICD-10-CM | POA: Insufficient documentation

## 2015-12-18 DIAGNOSIS — I447 Left bundle-branch block, unspecified: Secondary | ICD-10-CM | POA: Diagnosis not present

## 2015-12-18 DIAGNOSIS — Z888 Allergy status to other drugs, medicaments and biological substances status: Secondary | ICD-10-CM | POA: Diagnosis not present

## 2015-12-18 DIAGNOSIS — I509 Heart failure, unspecified: Secondary | ICD-10-CM | POA: Insufficient documentation

## 2015-12-18 DIAGNOSIS — I428 Other cardiomyopathies: Secondary | ICD-10-CM | POA: Insufficient documentation

## 2015-12-18 DIAGNOSIS — I110 Hypertensive heart disease with heart failure: Secondary | ICD-10-CM | POA: Insufficient documentation

## 2015-12-18 DIAGNOSIS — I5022 Chronic systolic (congestive) heart failure: Secondary | ICD-10-CM

## 2015-12-18 DIAGNOSIS — E7800 Pure hypercholesterolemia, unspecified: Secondary | ICD-10-CM | POA: Diagnosis not present

## 2015-12-18 DIAGNOSIS — R5383 Other fatigue: Secondary | ICD-10-CM | POA: Diagnosis not present

## 2015-12-18 DIAGNOSIS — Z833 Family history of diabetes mellitus: Secondary | ICD-10-CM | POA: Diagnosis not present

## 2015-12-18 DIAGNOSIS — I2699 Other pulmonary embolism without acute cor pulmonale: Secondary | ICD-10-CM | POA: Diagnosis not present

## 2015-12-18 DIAGNOSIS — Z86711 Personal history of pulmonary embolism: Secondary | ICD-10-CM | POA: Insufficient documentation

## 2015-12-18 MED ORDER — ALPRAZOLAM 0.5 MG PO TABS: 0.5000 mg | ORAL_TABLET | Freq: Three times a day (TID) | ORAL | 0 refills | Status: DC | PRN

## 2015-12-18 NOTE — Patient Instructions (Signed)
Your physician recommends that you schedule a follow-up appointment in: 6 weeks  

## 2015-12-20 NOTE — Progress Notes (Signed)
Patient ID: Stacy Mcintosh, female   DOB: [birth year], 60 y.o.   MRN: [id number] PCP: Stacy Mcintosh Cardiology: Dr. Shirlee LatchMcLean  60 yo with history of hypothyroidism, nonischemic cardiomyopathy, and PE presents for cardiology followup.  Hypothyroidism has been controlled by [facility] thyroid.  She is now seeing Dr. Lucianne MussKumar for endocrine management.  Her last echo in 5/17 showed EF 35-40% with diffuse hypokinesis.    She returns for followup today.  Says she has been fatigued with poor energy for several weeks now.  Not really getting short of breath.  No orthopnea/PND.  No chest pain.  Has not "felt right" since she was put on Symbicort. She is not taking Symbicort now.  Of note TSH was low when recently checked.  Weight is stable.   REDS vest measurement 24  ECG: NSR, LBBB  Labs (10/10): BNP 13, K 3.7, creatinine 0.8 Labs (7/11): K 4.1, creatinine 0.6 Labs (7/12): LDL 143, HDL 74, K 3.3, creatinine 1.0, TSH normal Labs (12/12): K 4, creatinine 0.8, HDL 85, LDL 107 Labs (11/15): K 4.2, creatinine 0.74, LDL 78, HDL 76 Labs (4/17): TSH normal, K 4.5, creatinine 0.92, HCT 42.2 Labs (5/17): LDL 105, HDL 72, BNP 261 Labs (6/17): D dimer negative, BNP 64 Labs (8/17): K 4.2, creatinine 1.05, TSH 0.329 (low), K 4.2, creatinine 1.05, hgb 15.2, BNP 25  Allergies (verified):  1)  ! Lisinopril  Past Medical History: 1. Hypothyroidism and history of Graves disease.  She is status post radioactive iodine therapy.  She then developed hypothyroidism, and the hypothyroidism has been difficult to control.   2. Cardiomyopathy, nonischemic.  The patient had a cardiac MRI done during her hospitalization in July 2009.  This showed moderate LV enlargement and global LV hypokinesis.  There was a septal paradox pattern.  EF was calculated to be 27%.  There was mild left atrial enlargement.  The RV was normal in size and function.  There was no ASD or VSD.  There was no evidence for delayed enhancement pattern  in the LV  myocardium suggesting that there was no scar from coronary artery disease and no definite evidence for infiltrative cardiomyopathy.  HIV was negative.  She did have an echo done also in the hospital in July 2009 that showed severe mitral regurgitation.  A TEE done following this showed an EF of 25-30% with severe mitral regurgitation.  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  The patient did have a left and right heart catheterization after treatment for congestive heart failure in January 2010.  There was no angiographic coronary disease.  EF had improved to 55-60% with no significant mitral  regurgitation.  The hemodynamics were totally normal on the rightheart catheterization.  The cardiomyopathy, I think, was likely du eto poorly controlled hypothyroidism.   - Echo (8/10): EF 50-55%, mild LV dilatation, mild-mod MR, PASP 32 - Echo (11/15): EF 30% - Echo (5/17): EF 35-40%, mild-moderate TR, moderate MR, PASP 50 mmHg.  3. PE/DVT.  July 2009.  The patient had been very sedentary prior to this due to her severe hypothyroidism.  She had  normal antithrombin III.  She was negative for factor V Leiden.  She had normal protein C and protein S function.  Now off coumadin.  4. Hypertension. 5. History of left bundle-branch block. 6. Asthma. 7. Migraines. 8. ACEI cough 9. Low back pain: L-spine disease.  10. Lyme disease: Treated in 12/16.   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   Current Outpatient Prescriptions  Medication Sig Dispense Refill  . aspirin 81 MG tablet Take 81 mg by mouth daily.      . carvedilol (COREG) 6.25  MG tablet TAKE 1 TABLET BY MOUTH 2 (TWO) TIMES DAILY. 180 tablet 3  . clonazePAM (KLONOPIN) 1 MG tablet Take 1 mg by mouth at bedtime as needed for anxiety.     . cyclobenzaprine (FLEXERIL) 10 MG tablet TAKE 1 TABLET (10 MG TOTAL) BY MOUTH AS NEEDED. 30 tablet 5  . doxepin (SINEQUAN) 10 MG capsule Take 10 mg by mouth at bedtime as needed (sleep).    . furosemide (LASIX) 40 MG tablet Take 1 tablet (40 mg total) by mouth daily. 30 tablet 3  . omeprazole (PRILOSEC) 40 MG capsule Take 1 capsule (40 mg total) by mouth daily. 90 capsule 3  . potassium chloride (K-DUR) 10 MEQ tablet Take 1 tablet (10 mEq total) by mouth daily. 30 tablet 3  . sacubitril-valsartan (ENTRESTO) 49-51 MG Take 1 tablet by mouth 2 (two) times daily. 60 tablet 6  . simvastatin (ZOCOR) 20 MG tablet Take 1 tablet (20 mg total) by mouth at bedtime. 90 tablet 3  . spironolactone (ALDACTONE) 25 MG tablet Take 1 tablet (25 mg total) by mouth daily. 90 tablet 3  . thyroid (ARMOUR THYROID) 15 MG tablet TAKE 1 TABLET (15 MG TOTAL) BY MOUTH DAILY. 90 tablet 3  . thyroid (ARMOUR THYROID) 60 MG tablet TAKE 1 TABLET (60 MG TOTAL) BY MOUTH DAILY BEFORE BREAKFAST. 90 tablet 3  . Vitamin D, Ergocalciferol, (DRISDOL) 50000 units CAPS capsule Take 1 capsule (50,000 Units total) by mouth every 7 (seven) days. 30 capsule 1  . ALPRAZolam (XANAX) 0.5 MG tablet Take 1 tablet (0.5 mg total) by mouth 3 (three) times daily as needed for anxiety. 30 tablet 0   No current facility-administered medications for this encounter.     BP 140/70   Pulse (!) 52   Wt 153 lb 8 oz (69.6 kg)   SpO2 99%   BMI 29.98 kg/m  General: NAD Neck: JVP 7 cm, no thyromegaly or thyroid nodule.  Lungs: Prolonged expiratory phase with occasional end-expiratory wheezes.  CV: Nondisplaced PMI.  Heart regular S1/S2 with paradoxical S2 split, no S3/S4, no murmur.  No edema.  No carotid bruit.  Normal pedal pulses.  Abdomen: Soft, nontender, no hepatosplenomegaly, no distention.   Neurologic: Alert and oriented x 3.  Psych: Normal affect. Extremities: No clubbing or cyanosis.   Assessment/Plan:  CARDIOMYOPATHY  Patient has a nonischemic cardiomyopathy that was thought to be due to uncontrolled hypothyroidism. Cardiac MRI in 2009 showed no LGE.  EF improved to 50-55% on echo in 2010. Cath in 2010 showed normal coronaries.  However, EF was back down to 30% in 11/15 and was 35-40% most recently in 5/17.  She reports NYHA class III symptoms that have worsened over the last few months.  She is now on Lasix and weight is down. Today, she is mainly complains of fatigue and not dyspnea.  She is not volume overloaded on exam, and REDS vest reading also does not suggest excess lung water.    - Continue Entresto 49/51 bid.  - Continue Lasix 40 mg daily (  would not increase).    - Continue Coreg. - Continue spironolactone 25 mg daily.   Hypothyroidism  Follows with endocrinology. Last TSH was suppressed, ?if her fatigue/lack of energy could be related to over-replacement of thyroid hormone.  I will have her followup with Dr Lucianne Muss for adjustment.  HYPERCHOLESTEROLEMIA Good lipids recently.   Pulmonary embolism  History of PE. No longer taking coumadin. She will continue ASA 81 mg daily.  Asthma Recently started on Symbicort.  She became symptomatically worse on Symbicort.  She does not appear volume overloaded so do not think this is due to CHF.  She has already stopped Symbicort.  I recommended that she contact her pulmonologist and let him know how she felt with this medication.  Fatigue Generalized and worse recently.  She is not anemic.  Labs overall look ok except low TSH, will have her followup with her endocrinologist for adjustment here.  She is not volume overloaded on exam.   Marca Ancona 12/20/2015

## 2015-12-24 ENCOUNTER — Ambulatory Visit: Payer: Managed Care, Other (non HMO)

## 2015-12-25 ENCOUNTER — Ambulatory Visit: Payer: Managed Care, Other (non HMO) | Admitting: Pulmonary Disease

## 2016-01-06 ENCOUNTER — Other Ambulatory Visit (HOSPITAL_COMMUNITY): Payer: Self-pay | Admitting: *Deleted

## 2016-01-06 MED ORDER — SACUBITRIL-VALSARTAN 49-51 MG PO TABS: 1.0000 | ORAL_TABLET | Freq: Two times a day (BID) | ORAL | 6 refills | Status: DC

## 2016-01-07 ENCOUNTER — Other Ambulatory Visit (HOSPITAL_COMMUNITY): Payer: Self-pay | Admitting: *Deleted

## 2016-01-07 MED ORDER — SACUBITRIL-VALSARTAN 49-51 MG PO TABS: 1.0000 | ORAL_TABLET | Freq: Two times a day (BID) | ORAL | 6 refills | Status: DC

## 2016-01-31 ENCOUNTER — Other Ambulatory Visit (HOSPITAL_COMMUNITY): Payer: Self-pay | Admitting: Cardiology

## 2016-02-11 ENCOUNTER — Encounter (HOSPITAL_COMMUNITY): Payer: Self-pay

## 2016-02-11 ENCOUNTER — Ambulatory Visit (INDEPENDENT_AMBULATORY_CARE_PROVIDER_SITE_OTHER): Payer: Managed Care, Other (non HMO) | Admitting: Cardiology

## 2016-02-11 ENCOUNTER — Ambulatory Visit (HOSPITAL_COMMUNITY)
Admission: RE | Admit: 2016-02-11 | Discharge: 2016-02-11 | Disposition: A | Discharge status: Home / Self Care | Payer: Managed Care, Other (non HMO) | Source: Ambulatory Visit | Attending: Cardiology | Admitting: Cardiology

## 2016-02-11 VITALS — BP 158/74 | HR 69 | Wt 156.0 lb

## 2016-02-11 DIAGNOSIS — I10 Essential (primary) hypertension: Secondary | ICD-10-CM | POA: Diagnosis not present

## 2016-02-11 DIAGNOSIS — Z79899 Other long term (current) drug therapy: Secondary | ICD-10-CM | POA: Diagnosis not present

## 2016-02-11 DIAGNOSIS — Z833 Family history of diabetes mellitus: Secondary | ICD-10-CM | POA: Insufficient documentation

## 2016-02-11 DIAGNOSIS — I428 Other cardiomyopathies: Secondary | ICD-10-CM

## 2016-02-11 DIAGNOSIS — I5022 Chronic systolic (congestive) heart failure: Secondary | ICD-10-CM

## 2016-02-11 DIAGNOSIS — Z86711 Personal history of pulmonary embolism: Secondary | ICD-10-CM | POA: Diagnosis not present

## 2016-02-11 DIAGNOSIS — E7800 Pure hypercholesterolemia, unspecified: Secondary | ICD-10-CM | POA: Insufficient documentation

## 2016-02-11 DIAGNOSIS — Z8249 Family history of ischemic heart disease and other diseases of the circulatory system: Secondary | ICD-10-CM | POA: Diagnosis not present

## 2016-02-11 DIAGNOSIS — E890 Postprocedural hypothyroidism: Secondary | ICD-10-CM | POA: Diagnosis not present

## 2016-02-11 DIAGNOSIS — Z7982 Long term (current) use of aspirin: Secondary | ICD-10-CM | POA: Diagnosis not present

## 2016-02-11 DIAGNOSIS — I442 Atrioventricular block, complete: Secondary | ICD-10-CM

## 2016-02-11 LAB — BASIC METABOLIC PANEL
Anion gap: 8 (ref 5–15)
BUN: 14 mg/dL (ref 6–20)
CALCIUM: 10 mg/dL (ref 8.9–10.3)
CO2: 29 mmol/L (ref 22–32)
CREATININE: 1.06 mg/dL — AB (ref 0.44–1.00)
Chloride: 99 mmol/L — ABNORMAL LOW (ref 101–111)
GFR calc non Af Amer: 55 mL/min — ABNORMAL LOW (ref >60)
Glucose, Bld: 89 mg/dL (ref 65–99)
Potassium: 3.5 mmol/L (ref 3.5–5.1)
SODIUM: 136 mmol/L (ref 135–145)

## 2016-02-11 MED ORDER — CARVEDILOL 12.5 MG PO TABS: 12.5000 mg | ORAL_TABLET | Freq: Two times a day (BID) | ORAL | 3 refills | Status: DC

## 2016-02-11 NOTE — Patient Instructions (Addendum)
Stop Carvedilol for now   Labs today  Your physician has requested that you have an echocardiogram. Echocardiography is a painless test that uses sound waves to create images of your heart. It provides your doctor with information about the size and shape of your heart and how well your heart's chambers and valves are working. This procedure takes approximately one hour. There are no restrictions for this procedure.  Your physician recommends that you schedule a follow-up appointment in: 1 month  Third-Degree Atrioventricular Block Third-degree atrioventricular (AV) block is a type of heart block. About Heart Block Heart block is a problem with the system that controls how often the heart beats (heart rate). If you have heart block, the electrical signals that regulate your heart rate are slowed or interrupted. Normal Heart Action The heart has two upper chambers (atria) and two lower chambers (ventricles). They work together to pump blood to the body. The heartbeat starts in an upper area of the right atrium (sinoatrial node, or SA node). This is the heart's natural pacemaker. The SA node sends electrical signals that pass through another node (atrioventricular node, or AV node), which is located between the atria and ventricles. Next, the signals travel through the ventricles on conduction pathways. As the signals pass down these pathways, the ventricles contract and send blood out to the body. About Third-Degree AV Block Third-degree heart block is the most serious type of AV block. Signals that control your heart rate are completely blocked. If you have third-degree AV block, the signals do not reach your ventricles. Your ventricles must contract on their own (escape beats). Escape beats are not enough to keep your heart working well. A slow heart rate may require an artificial pacemaker. CAUSES In some cases, a person is born with heart block. More often, the condition develops over time.  Third-degree heart block may be caused by:  Any condition that damages the heart's conducting system.  Some medicines that slow down the heart rate. RISK FACTORS The risk for this condition increases with age. It is also more likely to develop in people who have any of the following conditions:  A heart attack in the past.  Heart failure.  Coronary heart disease.  Inflammation of heart muscle (myocarditis).  Disease of heart muscle (cardiomyopathy).  Infection of the heart valves (endocarditis).  Infections or diseases that affect the heart. These include:  Lyme disease.  Sarcoidosis.  Hemochromatosis.  Rheumatic fever. SYMPTOMS Symptoms of this condition may include:  Fatigue.  Shortness of breath.  Dizziness or light-headedness.  Fainting.  Chest pain. DIAGNOSIS This condition may be diagnosed with:  A physical exam. Your health care provider may determine that you have a very slow and irregular heartbeat or pulse.  An electrocardiogram (ECG). This test is used to check for problems with the electrical activity in your heart. TREATMENT This condition requires immediate treatment in a hospital. Treatment may include:  Continuous heart rhythm monitoring.  Pacemaker placement. A pacemaker is a small, battery-powered device that is placed under the skin and is programmed to sense your heartbeats. If your heart rate is lower than the programmed rate, the pacemaker will pace your heart.  You may have a temporary pacemaker until a permanent pacemaker can be placed.  Stopping or changing heart medicines that can slow your heart rate.  Treating any medical conditions that may cause heart block. HOME CARE INSTRUCTIONS  If you get a permanent pacemaker, follow instructions from your health care provider about caring  for and living with the device.  Take over-the-counter and prescription medicines only as told by your health care provider.  Work with your health  care providers to control all your risks for heart disease. This may include following these instructions:  Get regular exercise. Ask your health care provider what type of exercise is safe for you.  Eat a heart-healthy diet. Your health care provider or dietitian can help you make healthy choices.  Maintain a healthy weight.  Do not use any tobacco products, including cigarettes, chewing tobacco, or e-cigarettes. If you need help quitting, ask your health care provider.  Limit alcohol intake to no more than 1 drink per day for nonpregnant women and 2 drinks per day for men. One drink equals 12 oz of beer, 5 oz of wine, or 1 oz of hard liquor.  Keep all follow-up visits as told by your health care provider. This is important. SEEK MEDICAL CARE IF:  Your symptoms change or get worse.  Your feel as though your heart is skipping beats.  You feel more tired than normal.  You have swelling in your lower legs. SEEK IMMEDIATE MEDICAL CARE IF:  You have chest pain, especially if the pain:  Feels like crushing or pressure.  Spreads to your arms, back, neck, or jaw.  You feel short of breath.  You feel light-headed or weak.  You faint.   This information is not intended to replace advice given to you by your health care provider. Make sure you discuss any questions you have with your health care provider.   Document Released: 03/24/2008 Document Revised: 08/26/2014 Document Reviewed: 03/12/2014 Elsevier Interactive Patient Education 2016 [employer]  Biventricular Pacemaker Implantation A pacemaker is a small, lightweight, battery-powered device that is placed (implanted) under the skin in the upper chest. Your health care provider may prescribe a pacemaker for you if your heartbeat is too slow (bradycardia). A biventricular pacemaker is a pulse generator connected by wires called leads that go into the two lower chambers on the right and left sides of your heart (ventricles). It  is used to treat symptoms of heart failure. The pulse generator is a small computer run by a battery. The generator creates a regular electronic pulse. The pulse is sent through the leads, which go through a blood vessel and into the ventricles of your heart. This type of pacemaker makes a weak heart more efficient. LET [facility] PROVIDER KNOW ABOUT:  Any allergies. Some allergies can cause serious problems during the procedure. Allergies to shellfish or agents, such as iodine, used in liquids that enhance specific areas of your body on X-ray images (contrast dyes) are especially problematic.   All medicines you take. These include vitamins, herbs, eye drops, over-the-counter medicines, and creams.   Use of steroids.   Problems with numbing medicines (anesthetics).  Bleeding problems.   Past surgeries.   Other health problems. RISKS AND COMPLICATIONS Implanting a biventricular pacemaker is usually a safe procedure but problems can occur. For example:   Too much bleeding may occur.   Infection may develop.   Blood vessels, your lungs, or your heart may be harmed.   The pacemaker may not make your condition better. BEFORE THE PROCEDURE   You may need to have blood tests, heart tests, or a chest X-ray done before the day of the procedure.   Ask your health care provider about changing or stopping your regular medicines.   Make plans to have someone drive you home.  Stop smoking at least 24 hours before the procedure.  Take a bath or shower the night before the procedure. You may need to scrub your chest with a special type of soap.  Do not eat or drink anything after midnight the night before your procedure. Ask if it is okay to take any needed medicine with a small sip of water. PROCEDURE The procedure to put a pacemaker in your chest is usually done at a hospital in a room that has a large X-ray machine called a fluoroscope. The machine will be above you during  the procedure. It will help your health care provider see your heart during the procedure. Before the procedure:   Small monitors will be put on your body. They will be used to check your heart, blood pressure, and oxygen level.  A needle will be put into a vein in your hand or arm. This is called an intravenous (IV) access tube. Fluids and medicine will flow directly into your body through the IV tube.  Your chest will be cleaned with a germ-killing (antiseptic) solution. Your chest may be shaved.  You may be given medicine to help you relax (sedative).   You will be given a numbing medicine called a local anesthetic. This medicine will make your chest area have no feeling while the pacemaker is implanted. You will be sleepy but awake during the procedure. After you are numb, the procedure will begin. The health care provider will:   Make a small cut (incision). This will make a pocket deep under your skin that will hold the pulse generator.  Guide the leads through a large blood vessel into your heart and attach them to the heart muscles.  Test the pacemaker.  Close the incision with stitches, glue, or staples. AFTER THE PROCEDURE  You may feel pain. Some pain is normal. It may last a few days.   You may stay in a recovery area until the local anesthetic has worn off. Your blood pressure and pulse will be checked often. You will be taken to a room where your heartbeat will be monitored.  A chest X-ray will be taken. This checks that the pacemaker is in the right place.  The pacemaker will be checked before you go home. It can be adjusted if that is needed.   This information is not intended to replace advice given to you by your health care provider. Make sure you discuss any questions you have with your health care provider.   Document Released: 01/04/2012 Document Revised: 05/02/2014 Document Reviewed: 01/04/2012 Elsevier Interactive Patient Education [employer]

## 2016-02-12 ENCOUNTER — Ambulatory Visit (HOSPITAL_COMMUNITY)
Admission: RE | Admit: 2016-02-12 | Discharge: 2016-02-12 | Disposition: A | Discharge status: Home / Self Care | Payer: Managed Care, Other (non HMO) | Source: Ambulatory Visit | Attending: Nurse Practitioner | Admitting: Nurse Practitioner

## 2016-02-12 ENCOUNTER — Telehealth: Payer: Self-pay | Admitting: Cardiology

## 2016-02-12 ENCOUNTER — Other Ambulatory Visit: Payer: Self-pay | Admitting: Cardiology

## 2016-02-12 ENCOUNTER — Ambulatory Visit (HOSPITAL_COMMUNITY): Admission: RE | Admit: 2016-02-12 | Payer: Managed Care, Other (non HMO) | Source: Ambulatory Visit

## 2016-02-12 DIAGNOSIS — I5022 Chronic systolic (congestive) heart failure: Secondary | ICD-10-CM | POA: Diagnosis not present

## 2016-02-12 DIAGNOSIS — I442 Atrioventricular block, complete: Secondary | ICD-10-CM

## 2016-02-12 NOTE — Telephone Encounter (Signed)
New Message  Merla Riches call to get a CPT code for pt procedure on 10/24 for CATH. Merla Riches states and authorizations is needed for this procedure. Please call back to discuss

## 2016-02-12 NOTE — Telephone Encounter (Signed)
Informed that I would send pre cert information to our billing department Tuesday morning (after echo is completed)

## 2016-02-12 NOTE — Progress Notes (Signed)
  Echocardiogram 2D Echocardiogram has been performed.  Leta Jungling M 02/12/2016, 1:42 PM

## 2016-02-12 NOTE — Progress Notes (Signed)
Electrophysiology Office Note   Date:  02/12/2016   ID:  Stacy CashLaura Mumby, DOB Sep 12, 1955, MRN [id number]  PCP:  Deneen HartsDD,[name], FNP  Cardiologist:  Shirlee LatchMcLean Primary Electrophysiologist:  Regan LemmingWill Martin Camnitz, MD    No chief complaint on file.    History of Present Illness: Stacy Mcintosh is a 60 y.o. female who presents today for electrophysiology evaluation.   history of hypothyroidism, nonischemic cardiomyopathy, and PE. Presented to heart failure clinic and on routine EKG was found to be in heart block with a right bundle branch block morphology. She has a history of left bundle branch block in the past. She is on carvedilol, and does not wish at this time to have a pacemaker placed. She says she has been under quite a bit of stress due to family illnesses and a stressful marriage. Otherwise she has been feeling well with out any issues. She does not have overt shortness of breath or chest pain.   Today, she denies symptoms of palpitations, chest pain, shortness of breath, orthopnea, PND, lower extremity edema, claudication, dizziness, presyncope, syncope, bleeding, or neurologic sequela. The patient is tolerating medications without difficulties and is otherwise without complaint today.    Past Medical History:  Diagnosis Date  . Anxiety   . Arthritis   . ASTHMA 10/16/2007  . BUNDLE BRANCH BLOCK, LEFT 11/12/2008  . CARDIOMYOPATHY 11/12/2008  . CHF (congestive heart failure) (HCC)    a.Echo 5/17: EF 35-40%, moderate MR, mild LAE, mild to moderate TR, PASP 50 mmHg  . Depression   . Hx of migraines   . HYPERTENSION 10/16/2007  . Hypothyroidism    h/o Graves Disease  . Other and unspecified hyperlipidemia 01/19/2010  . PE (pulmonary embolism) 2009   Factor V negative  . Pure hypercholesterolemia 11/18/2008   Past Surgical History:  Procedure Laterality Date  . ABDOMINAL HYSTERECTOMY    . CESAREAN SECTION     x3     Current Outpatient Prescriptions  Medication Sig Dispense Refill    . ALPRAZolam (XANAX) 0.5 MG tablet Take 1 tablet (0.5 mg total) by mouth 3 (three) times daily as needed for anxiety. 30 tablet 0  . aspirin 81 MG tablet Take 81 mg by mouth daily.      . clonazePAM (KLONOPIN) 1 MG tablet Take 1 mg by mouth at bedtime as needed for anxiety.     . cyclobenzaprine (FLEXERIL) 10 MG tablet TAKE 1 TABLET (10 MG TOTAL) BY MOUTH AS NEEDED. 30 tablet 5  . doxepin (SINEQUAN) 10 MG capsule Take 10 mg by mouth at bedtime as needed (sleep).    . furosemide (LASIX) 40 MG tablet TAKE 1 TABLET (40 MG TOTAL) BY MOUTH DAILY. 30 tablet 3  . omeprazole (PRILOSEC) 40 MG capsule Take 1 capsule (40 mg total) by mouth daily. 90 capsule 3  . sacubitril-valsartan (ENTRESTO) 49-51 MG Take 1 tablet by mouth 2 (two) times daily. 180 tablet 6  . simvastatin (ZOCOR) 20 MG tablet Take 1 tablet (20 mg total) by mouth at bedtime. 90 tablet 3  . spironolactone (ALDACTONE) 25 MG tablet Take 1 tablet (25 mg total) by mouth daily. 90 tablet 3  . thyroid (ARMOUR THYROID) 60 MG tablet TAKE 1 TABLET (60 MG TOTAL) BY MOUTH DAILY BEFORE BREAKFAST. 90 tablet 3  . Vitamin D, Ergocalciferol, (DRISDOL) 50000 units CAPS capsule Take 1 capsule (50,000 Units total) by mouth every 7 (seven) days. 30 capsule 1   No current facility-administered medications for this visit.  Allergies:   Augmentin [amoxicillin-pot clavulanate] and Lisinopril   Social History:  The patient  reports that she has never smoked. She has never used smokeless tobacco. She reports that she drinks alcohol. She reports that she does not use drugs.   Family History:  The patient's family history includes Arthritis in her sister; Diabetes in her brother; Heart attack in her brother and mother; Hypertension in her mother and sister; Ovarian cancer in her mother; Prostate cancer in her father; Rheumatologic disease in her mother; Sudden death in her maternal grandfather; Thyroid disease in her brother.    ROS:  Please see the history of  present illness.   Otherwise, review of systems is positive for none.   All other systems are reviewed and negative.    PHYSICAL EXAM: VS:  BP 158/74, HR 69 GEN: Well nourished, well developed, in no acute distress  HEENT: normal  Neck: no JVD, carotid bruits, or masses Cardiac: RRR; no murmurs, rubs, or gallops,no edema  Respiratory:  clear to auscultation bilaterally, normal work of breathing GI: soft, nontender, nondistended, + BS MS: no deformity or atrophy  Skin: warm and dry Neuro:  Strength and sensation are intact Psych: euthymic mood, full affect  EKG:  EKG is ordered today. Personal review of the ekg ordered shows sinus rhythm, complete AV block, RBBB   Recent Labs: 08/11/2015: ALT 36 12/15/2015: B Natriuretic Peptide 24.8; Hemoglobin 15.2; Platelets 351; TSH 0.329 02/11/2016: BUN 14; Creatinine, Ser 1.06; Potassium 3.5; Sodium 136    Lipid Panel     Component Value Date/Time   CHOL 195 09/17/2015 1301   TRIG 91 09/17/2015 1301   HDL 72 09/17/2015 1301   CHOLHDL 2.7 09/17/2015 1301   VLDL 18 09/17/2015 1301   LDLCALC 105 (H) 09/17/2015 1301   LDLDIRECT 140.8 04/23/2012 1011     Wt Readings from Last 3 Encounters:  02/11/16 156 lb (70.8 kg)  12/18/15 153 lb 8 oz (69.6 kg)  11/09/15 152 lb 12 oz (69.3 kg)      Other studies Reviewed: Additional studies/ records that were reviewed today include: TTE 08/31/15  Review of the above records today demonstrates:  - Left ventricle: The cavity size was normal. Wall thickness was   normal. Systolic function was moderately reduced. The estimated   ejection fraction was in the range of 35% to 40%. - Mitral valve: There was moderate regurgitation. - Left atrium: The atrium was mildly dilated. - Tricuspid valve: There was mild-moderate regurgitation. - Pulmonary arteries: Systolic pressure was moderately increased.   PA peak pressure: 50 mm Hg (S).   ASSESSMENT AND PLAN:  1.  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 She is on carvedilol and therefore we will stop it with an echocardiogram tomorrow. We will have her return to clinic on Monday for an EKG and a discussion of whether or not she wants a device placed. If her EF is low on her echo she would require a CRT-D, but if it is greater than 35% we'll plan for CRT P placement.  2. Nonischemic cardiomyopathy: Has had a stable ejection fraction and is on optimal medical therapy with Coreg and Entresto. We'll plan to hold the Coreg if she is in heart block. She may have this restarted once she gets a pacemaker potentially on Tuesday.    Current medicines are reviewed at length with the patient today.   The patient does not have concerns regarding her medicines.  The following changes were made today:  Hold  coreg  Labs/ tests ordered today include:  No orders of the defined types were placed in this encounter.    Disposition:   FU with Will Camnitz 3 months  Signed, Will Jorja Loa, MD  02/12/2016 8:06 AM     [facility] [address] [place] [address] [phone] (office) [phone] (fax)

## 2016-02-12 NOTE — Progress Notes (Signed)
Patient ID: Stacy Mcintosh, female   DOB: 1955/06/27, 60 y.o.   MRN: [id number] PCP: Deneen HartsElizabeth Todd Cardiology: Dr. Shirlee LatchMcLean  60 yo with history of hypothyroidism, nonischemic cardiomyopathy, and PE presents for cardiology followup.  Hypothyroidism has been controlled by [place] thyroid.  She is now seeing Dr. Lucianne MussKumar for endocrine management.  Her last echo in 5/17 showed EF 35-40% with diffuse hypokinesis.    She returns for followup today. She feels better overall.  Taking her medications.  Less fatigued, working full time as a Teacher, adult educationmassage therapist.  No exertional dyspnea, no chest pain, no lightheadedness or syncope.  Still with a lot of stress at home.  BP is elevated.   ECG: Sinus rhythm with complete heart block and accelerated junctional RBBB-like escape.    Labs (10/10): BNP 13, K 3.7, creatinine 0.8 Labs (7/11): K 4.1, creatinine 0.6 Labs (7/12): LDL 143, HDL 74, K 3.3, creatinine 1.0, TSH normal Labs (12/12): K 4, creatinine 0.8, HDL 85, LDL 107 Labs (11/15): K 4.2, creatinine 0.74, LDL 78, HDL 76 Labs (4/17): TSH normal, K 4.5, creatinine 0.92, HCT 42.2 Labs (5/17): LDL 105, HDL 72, BNP 261 Labs (6/17): D dimer negative, BNP 64 Labs (8/17): K 4.2, creatinine 1.05, TSH 0.329 (low), K 4.2, creatinine 1.05, hgb 15.2, BNP 25  Allergies (verified):  1)  ! Lisinopril  Past Medical History: 1. Hypothyroidism and history of Graves disease.  She is status post radioactive iodine therapy.  She then developed hypothyroidism, and the hypothyroidism has been difficult to control.   2. Cardiomyopathy, nonischemic.  The patient had a cardiac MRI done during her hospitalization in July 2009.  This showed moderate LV enlargement and global LV hypokinesis.  There was a septal paradox pattern.  EF was calculated to be 27%.  There was mild left atrial enlargement.  The RV was normal in size and function.  There was no ASD or VSD.  There was no evidence for delayed enhancement pattern  in the LV myocardium  suggesting that there was no scar from coronary artery disease and no definite evidence for infiltrative cardiomyopathy.  HIV was negative.  She did have an echo done also in the hospital in July 2009 that showed severe mitral regurgitation.  A TEE done following this showed an EF of 25-30% with severe mitral regurgitation.  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  The patient did have a left and right heart catheterization after treatment for congestive heart failure in January 2010.  There was no angiographic coronary disease.  EF had improved to 55-60% with no significant mitral  regurgitation.  The hemodynamics were totally normal on the rightheart catheterization.  The cardiomyopathy, I think, was likely du eto poorly controlled hypothyroidism.   - Echo (8/10): EF 50-55%, mild LV dilatation, mild-mod MR, PASP 32 - Echo (11/15): EF 30% - Echo (5/17): EF 35-40%, mild-moderate TR, moderate MR, PASP 50 mmHg.  3. PE/DVT.  July 2009.  The patient had been very sedentary prior to this due to her severe hypothyroidism.  She had  normal antithrombin III.  She was negative for factor V Leiden.  She had normal protein C and protein S function.  Now off coumadin.  4. Hypertension. 5. History of left bundle-branch block. 6. Asthma. 7. Migraines. 8. ACEI cough 9. Low back pain: L-spine disease.  10. Lyme disease: Treated in 12/16.   Family History: Mother-living; heart attack, HTN, Rheumatism Father-deceased age 61; prostate cancer Brother-living, parathyroid problem Sister- living; arthiritis  Brother- living; premature heart attack x 2; DM-insulin Niece- blood clot in groin  Social History: Married with 3 children  Teacher, adult education. Never smoked ETOH-no   ROS: All systems reviewed and negative except as per HPI.   Current Outpatient Prescriptions  Medication Sig Dispense Refill  . ALPRAZolam (XANAX) 0.5 MG tablet Take 1 tablet (0.5 mg total) by mouth 3 (three) times  daily as needed for anxiety. 30 tablet 0  . cyclobenzaprine (FLEXERIL) 10 MG tablet TAKE 1 TABLET (10 MG TOTAL) BY MOUTH AS NEEDED. (Patient taking differently: Take 10 mg by mouth 3 (three) times daily as needed for muscle spasms. ) 30 tablet 5  . furosemide (LASIX) 40 MG tablet TAKE 1 TABLET (40 MG TOTAL) BY MOUTH DAILY. 30 tablet 3  . omeprazole (PRILOSEC) 40 MG capsule Take 1 capsule (40 mg total) by mouth daily. 90 capsule 3  . sacubitril-valsartan (ENTRESTO) 49-51 MG Take 1 tablet by mouth 2 (two) times daily. 180 tablet 6  . simvastatin (ZOCOR) 20 MG tablet Take 1 tablet (20 mg total) by mouth at bedtime. 90 tablet 3  . spironolactone (ALDACTONE) 25 MG tablet Take 1 tablet (25 mg total) by mouth daily. 90 tablet 3  . thyroid (ARMOUR THYROID) 60 MG tablet TAKE 1 TABLET (60 MG TOTAL) BY MOUTH DAILY BEFORE BREAKFAST. 90 tablet 3  . Vitamin D, Ergocalciferol, (DRISDOL) 50000 units CAPS capsule Take 1 capsule (50,000 Units total) by mouth every 7 (seven) days. (Patient taking differently: Take 50,000 Units by mouth every Sunday. ) 30 capsule 1  . aspirin EC 81 MG tablet Take 81 mg by mouth daily at 12 noon.    . clonazePAM (KLONOPIN) 1 MG tablet Take 1 mg by mouth at bedtime as needed for anxiety.     Marland Kitchen doxepin (SINEQUAN) 10 MG capsule Take 10 mg by mouth at bedtime as needed (sleep).     No current facility-administered medications for this encounter.     BP (!) 158/74   Pulse 69   Wt 156 lb (70.8 kg)   SpO2 100%   BMI 30.47 kg/m  General: NAD Neck: JVP 7 cm, no thyromegaly or thyroid nodule.  Lungs: Prolonged expiratory phase with occasional end-expiratory wheezes.  CV: Nondisplaced PMI.  Heart regular S1/S2 with paradoxical S2 split, no S3/S4, no murmur.  No edema.  No carotid bruit.  Normal pedal pulses.  Abdomen: Soft, nontender, no hepatosplenomegaly, no distention.  Neurologic: Alert and oriented x 3.  Psych: Normal affect. Extremities: No clubbing or cyanosis.    Assessment/Plan:  CARDIOMYOPATHY  Patient has a nonischemic cardiomyopathy that was thought to be due to uncontrolled hypothyroidism. Cardiac MRI in 2009 showed no LGE.  EF improved to 50-55% on echo in 2010. Cath in 2010 showed normal coronaries.  However, EF was back down to 30% in 11/15 and was 35-40% most recently in 5/17.  Improved today, NYHA class I-II.  Not volume overloaded on exam.  - Continue Entresto 49/51 bid.  - Continue Lasix 40 mg daily.  BMET today.  - Continue spironolactone 25 mg daily.   - Given CHB on today's ECG, will stop Coreg.  Hypothyroidism  Follows with endocrinology.  HYPERCHOLESTEROLEMIA Good lipids when last checked.   Pulmonary embolism  History of PE. No longer taking coumadin. She will continue ASA 81 mg daily.  Complete heart block In the past, she has had a LBBB.  Today, she has CHB with an accelerated junctional RBBB-like escape.  Dr Elberta Fortis came to the see  the patient.  We agree that she will need a pacemaker.  Plan will be to stop Coreg and to obtain echo tomorrow.  She will return on Monday for repeat ECG.  If she remains in CHB and EF remains low, she will undergo CRT placement. Extensive discussion with patient today.   Marca Ancona 02/12/2016

## 2016-02-15 ENCOUNTER — Encounter (HOSPITAL_COMMUNITY): Payer: Self-pay | Admitting: *Deleted

## 2016-02-15 ENCOUNTER — Ambulatory Visit (HOSPITAL_COMMUNITY)
Admission: RE | Admit: 2016-02-15 | Discharge: 2016-02-15 | Disposition: A | Discharge status: Home / Self Care | Payer: Managed Care, Other (non HMO) | Source: Ambulatory Visit | Attending: Cardiology | Admitting: Cardiology

## 2016-02-15 ENCOUNTER — Telehealth (HOSPITAL_COMMUNITY): Payer: Self-pay

## 2016-02-15 VITALS — BP 118/72 | HR 51 | Wt 155.2 lb

## 2016-02-15 DIAGNOSIS — I5023 Acute on chronic systolic (congestive) heart failure: Secondary | ICD-10-CM

## 2016-02-15 DIAGNOSIS — I442 Atrioventricular block, complete: Secondary | ICD-10-CM | POA: Diagnosis present

## 2016-02-15 NOTE — Telephone Encounter (Signed)
Patient called CHF clinic triage line to ask for cath instructions. Patient thought she was having a cath today at 51. Advised she was scheduled for a repeat EKG in CHF clinic and if she remained in HB, instructions for cath lab pacemaker placement would be given to her at that time if MD wished to proceed. Patient aware and will be to clinic today for EKG and instructions.  Ave Filter, RN

## 2016-02-15 NOTE — Patient Instructions (Signed)
You are scheduled for pacemaker insertion 10/24, see instruction sheet   Pacemaker Implantation The heart has its own electrical system, or natural pacemaker, to regulate the heartbeat. Sometimes, the natural pacemaker system of the heart fails and causes the heart to beat too slowly. If this happens, a pacemaker can be surgically placed to help the heart beat at a normal or programmed rate. A pacemaker is a small, battery-powered device that is placed under the skin and is programmed to sense your heartbeats. If your heart rate is lower than the programmed rate, the pacemaker will pace your heart. Parts of a pacemaker include:  Wires or leads. The leads are placed in the heart and transmit electricity to the heart. The leads are connected to the pulse generator.  Pulse generator. The pulse generator contains a computer and a memory system. The pulse generator also produces the electrical signal that triggers the heart to beat. A pacemaker may be placed if:  You have a slow heartbeat (bradycardia).  You have fainting (syncope).  Shortness of breath (dyspnea) due to heart problems. LET [facility] CARE PROVIDER KNOW ABOUT:  Any allergies you may have.  All medicines you are taking, including vitamins, herbs, eye drops, creams, and over-the-counter medicines.  Previous problems you or members of your family have had with the use of anesthetics.  Any blood disorders you have.  Previous surgeries you have had.  Medical conditions you have.  Possibility of pregnancy, if this applies. RISKS AND COMPLICATIONS Generally, pacemaker implantation is a safe procedure. However, problems can occur and include:  Bleeding.  Unable to place the pacemaker under local sedation.  Infection. BEFORE THE PROCEDURE  You will have blood work drawn before the procedure.  Do not use any tobacco products including cigarettes, chewing tobacco, or electronic cigarettes. If you need help quitting, ask  your health care provider.  Do not eat or drink anything after midnight on the night before the procedure or as directed by your health care provider.  Ask your health care provider about:  Changing or stopping your regular medicines. This is especially important if you are taking diabetes medicines or blood thinners.  Taking medicines such as aspirin and ibuprofen. These medicines can thin your blood. Do not take these medicines before your procedure if your health care provider asks you not to.  Ask your health care provider if you can take a sip of water with any approved medicines the morning of the procedure. PROCEDURE  The surgery to place a pacemaker is considered a minimally invasive surgical procedure. It is done under a local anesthetic, which is an injection at the incision site that makes the skin numb. You are also given sedation and pain medicine that makes you drowsy during the procedure.   An intravenous line (IV) will be started in your hand or arm so sedation and pain medicine can be given during the pacemaker procedure.  A numbing medicine will be injected into the skin where the pacemaker is to be placed. A small incision will then be made into the skin. The pacemaker is usually placed under the skin near the collarbone.  After the incision has been made, the leads will be inserted into a large vein and guided into the heart using X-ray.  Using the same incision that was used to place the leads, a small pocket will be created under the skin to hold the pulse generator. The leads will then be connected to the pulse generator.  The incision  site will then be closed. A bandage (dressing) is placed over the pacemaker site. The dressing is removed 24-48 hours afterward. AFTER THE PROCEDURE  You will be taken to a recovery area after the pacemaker implant. Your vital signs such as blood pressure, heart rate, breathing, and oxygen levels will be monitored.  A chest X-ray will  be done after the pacemaker has been implanted. This is to make sure the pacemaker and leads are in the correct place.   This information is not intended to replace advice given to you by your health care provider. Make sure you discuss any questions you have with your health care provider.   Document Released: 04/01/2002 Document Revised: 05/02/2014 Document Reviewed: 08/16/2011 Elsevier Interactive Patient Education [employer]2016 Elsevier Inc.

## 2016-02-16 ENCOUNTER — Telehealth: Payer: Self-pay | Admitting: Cardiology

## 2016-02-16 ENCOUNTER — Ambulatory Visit (HOSPITAL_COMMUNITY)
Admission: RE | Admit: 2016-02-16 | Discharge: 2016-02-17 | Disposition: A | Discharge status: Home / Self Care | Payer: Managed Care, Other (non HMO) | Source: Ambulatory Visit | Attending: Cardiology | Admitting: Cardiology

## 2016-02-16 ENCOUNTER — Encounter (HOSPITAL_COMMUNITY): Payer: Self-pay | Admitting: *Deleted

## 2016-02-16 ENCOUNTER — Encounter (HOSPITAL_COMMUNITY)
Admission: RE | Disposition: A | Discharge status: Home / Self Care | Payer: Self-pay | Source: Ambulatory Visit | Attending: Cardiology

## 2016-02-16 DIAGNOSIS — Z880 Allergy status to penicillin: Secondary | ICD-10-CM | POA: Insufficient documentation

## 2016-02-16 DIAGNOSIS — E039 Hypothyroidism, unspecified: Secondary | ICD-10-CM | POA: Diagnosis not present

## 2016-02-16 DIAGNOSIS — I10 Essential (primary) hypertension: Secondary | ICD-10-CM | POA: Insufficient documentation

## 2016-02-16 DIAGNOSIS — Z86711 Personal history of pulmonary embolism: Secondary | ICD-10-CM | POA: Insufficient documentation

## 2016-02-16 DIAGNOSIS — Z95818 Presence of other cardiac implants and grafts: Secondary | ICD-10-CM

## 2016-02-16 DIAGNOSIS — I428 Other cardiomyopathies: Secondary | ICD-10-CM | POA: Diagnosis not present

## 2016-02-16 DIAGNOSIS — F419 Anxiety disorder, unspecified: Secondary | ICD-10-CM | POA: Insufficient documentation

## 2016-02-16 DIAGNOSIS — I442 Atrioventricular block, complete: Secondary | ICD-10-CM | POA: Diagnosis present

## 2016-02-16 DIAGNOSIS — I452 Bifascicular block: Secondary | ICD-10-CM | POA: Insufficient documentation

## 2016-02-16 HISTORY — PX: EP IMPLANTABLE DEVICE: SHX172B

## 2016-02-16 HISTORY — DX: Presence of cardiac pacemaker: Z95.0

## 2016-02-16 HISTORY — DX: Acute embolism and thrombosis of unspecified deep veins of unspecified lower extremity: I82.409

## 2016-02-16 HISTORY — DX: Other chronic pain: G89.29

## 2016-02-16 HISTORY — DX: Low back pain, unspecified: M54.50

## 2016-02-16 HISTORY — DX: Thyrotoxicosis with diffuse goiter without thyrotoxic crisis or storm: E05.00

## 2016-02-16 HISTORY — DX: Migraine, unspecified, not intractable, without status migrainosus: G43.909

## 2016-02-16 HISTORY — DX: Low back pain: M54.5

## 2016-02-16 LAB — CBC
HEMATOCRIT: 41.6 % (ref 36.0–46.0)
Hemoglobin: 13.6 g/dL (ref 12.0–15.0)
MCH: 30.8 pg (ref 26.0–34.0)
MCHC: 32.7 g/dL (ref 30.0–36.0)
MCV: 94.1 fL (ref 78.0–100.0)
PLATELETS: 336 10*3/uL (ref 150–400)
RBC: 4.42 MIL/uL (ref 3.87–5.11)
RDW: 13.6 % (ref 11.5–15.5)
WBC: 8.5 10*3/uL (ref 4.0–10.5)

## 2016-02-16 LAB — SURGICAL PCR SCREEN
MRSA, PCR: NEGATIVE
STAPHYLOCOCCUS AUREUS: NEGATIVE

## 2016-02-16 SURGERY — PACEMAKER IMPLANT
Anesthesia: LOCAL

## 2016-02-16 MED ORDER — VANCOMYCIN HCL IN DEXTROSE 1-5 GM/200ML-% IV SOLN
1000.0000 mg | Freq: Two times a day (BID) | INTRAVENOUS | Status: AC
  Administered 2016-02-16: 1000 mg via INTRAVENOUS
  Filled 2016-02-16: qty 200

## 2016-02-16 MED ORDER — CLONAZEPAM 0.5 MG PO TABS
1.0000 mg | ORAL_TABLET | Freq: Every evening | ORAL | Status: DC | PRN
  Administered 2016-02-17: 1 mg via ORAL
  Filled 2016-02-16: qty 2

## 2016-02-16 MED ORDER — HEPARIN (PORCINE) IN NACL 2-0.9 UNIT/ML-% IJ SOLN
INTRAMUSCULAR | Status: AC
  Filled 2016-02-16: qty 500

## 2016-02-16 MED ORDER — MUPIROCIN 2 % EX OINT
TOPICAL_OINTMENT | CUTANEOUS | Status: AC
  Administered 2016-02-16: 08:00:00
  Filled 2016-02-16: qty 22

## 2016-02-16 MED ORDER — VANCOMYCIN HCL IN DEXTROSE 1-5 GM/200ML-% IV SOLN
1000.0000 mg | INTRAVENOUS | Status: AC
  Administered 2016-02-16: 1000 mg via INTRAVENOUS

## 2016-02-16 MED ORDER — MUPIROCIN 2 % EX OINT: 1.0000 "application " | TOPICAL_OINTMENT | Freq: Once | CUTANEOUS | Status: DC

## 2016-02-16 MED ORDER — LIDOCAINE HCL (PF) 1 % IJ SOLN
INTRAMUSCULAR | Status: DC | PRN
  Administered 2016-02-16: 50 mL

## 2016-02-16 MED ORDER — ONDANSETRON HCL 4 MG/2ML IJ SOLN: 4.0000 mg | Freq: Four times a day (QID) | INTRAMUSCULAR | Status: DC | PRN

## 2016-02-16 MED ORDER — SACUBITRIL-VALSARTAN 49-51 MG PO TABS
1.0000 | ORAL_TABLET | Freq: Two times a day (BID) | ORAL | Status: DC
  Administered 2016-02-16: 1 via ORAL
  Filled 2016-02-16 (×2): qty 1

## 2016-02-16 MED ORDER — HYDROCODONE-ACETAMINOPHEN 5-325 MG PO TABS
1.0000 | ORAL_TABLET | ORAL | Status: DC | PRN
  Administered 2016-02-16 (×3): 1 via ORAL
  Filled 2016-02-16 (×3): qty 1

## 2016-02-16 MED ORDER — SIMVASTATIN 20 MG PO TABS
20.0000 mg | ORAL_TABLET | Freq: Every day | ORAL | Status: DC
  Administered 2016-02-16: 22:00:00 20 mg via ORAL
  Filled 2016-02-16: qty 1

## 2016-02-16 MED ORDER — SPIRONOLACTONE 25 MG PO TABS: 25.0000 mg | ORAL_TABLET | Freq: Every day | ORAL | Status: DC

## 2016-02-16 MED ORDER — ACETAMINOPHEN 325 MG PO TABS
325.0000 mg | ORAL_TABLET | ORAL | Status: DC | PRN
  Administered 2016-02-16: 650 mg via ORAL
  Filled 2016-02-16: qty 2

## 2016-02-16 MED ORDER — CARVEDILOL 3.125 MG PO TABS
6.2500 mg | ORAL_TABLET | Freq: Two times a day (BID) | ORAL | Status: DC
Start: 2016-02-16 — End: 2016-02-17
  Administered 2016-02-16 – 2016-02-17 (×2): 6.25 mg via ORAL
  Filled 2016-02-16 (×2): qty 2

## 2016-02-16 MED ORDER — ASPIRIN EC 81 MG PO TBEC: 81.0000 mg | DELAYED_RELEASE_TABLET | Freq: Every day | ORAL | Status: DC

## 2016-02-16 MED ORDER — GENTAMICIN SULFATE 40 MG/ML IJ SOLN
INTRAMUSCULAR | Status: AC
  Filled 2016-02-16: qty 2

## 2016-02-16 MED ORDER — YOU HAVE A PACEMAKER BOOK
Freq: Once | Status: AC
  Administered 2016-02-17: 06:00:00 1
  Filled 2016-02-16: qty 1

## 2016-02-16 MED ORDER — PANTOPRAZOLE SODIUM 40 MG PO TBEC: 40.0000 mg | DELAYED_RELEASE_TABLET | Freq: Every day | ORAL | Status: DC

## 2016-02-16 MED ORDER — FENTANYL CITRATE (PF) 100 MCG/2ML IJ SOLN
INTRAMUSCULAR | Status: DC | PRN
  Administered 2016-02-16 (×3): 25 ug via INTRAVENOUS

## 2016-02-16 MED ORDER — SODIUM CHLORIDE 0.9 % IV SOLN
INTRAVENOUS | Status: DC
  Administered 2016-02-16: 08:00:00 via INTRAVENOUS

## 2016-02-16 MED ORDER — FENTANYL CITRATE (PF) 100 MCG/2ML IJ SOLN
INTRAMUSCULAR | Status: AC
  Filled 2016-02-16: qty 2

## 2016-02-16 MED ORDER — ALPRAZOLAM 0.5 MG PO TABS: 0.5000 mg | ORAL_TABLET | Freq: Three times a day (TID) | ORAL | Status: DC | PRN

## 2016-02-16 MED ORDER — MIDAZOLAM HCL 5 MG/5ML IJ SOLN
INTRAMUSCULAR | Status: DC | PRN
  Administered 2016-02-16 (×3): 1 mg via INTRAVENOUS

## 2016-02-16 MED ORDER — LIDOCAINE HCL (PF) 1 % IJ SOLN
INTRAMUSCULAR | Status: AC
  Filled 2016-02-16: qty 60

## 2016-02-16 MED ORDER — VANCOMYCIN HCL IN DEXTROSE 1-5 GM/200ML-% IV SOLN
INTRAVENOUS | Status: AC
  Filled 2016-02-16: qty 200

## 2016-02-16 MED ORDER — FUROSEMIDE 40 MG PO TABS: 40.0000 mg | ORAL_TABLET | Freq: Every day | ORAL | Status: DC

## 2016-02-16 MED ORDER — MIDAZOLAM HCL 5 MG/5ML IJ SOLN
INTRAMUSCULAR | Status: AC
  Filled 2016-02-16: qty 5

## 2016-02-16 MED ORDER — LABETALOL HCL 5 MG/ML IV SOLN
10.0000 mg | INTRAVENOUS | Status: DC | PRN
Start: 2016-02-16 — End: 2016-02-17

## 2016-02-16 MED ORDER — HEPARIN (PORCINE) IN NACL 2-0.9 UNIT/ML-% IJ SOLN
INTRAMUSCULAR | Status: DC | PRN
  Administered 2016-02-16: 11:00:00

## 2016-02-16 MED ORDER — DOXEPIN HCL 10 MG PO CAPS: 10.0000 mg | ORAL_CAPSULE | Freq: Every evening | ORAL | Status: DC | PRN

## 2016-02-16 MED ORDER — ENSURE ENLIVE PO LIQD
237.0000 mL | Freq: Two times a day (BID) | ORAL | Status: DC
  Filled 2016-02-16 (×4): qty 237

## 2016-02-16 MED ORDER — SODIUM CHLORIDE 0.9 % IR SOLN
80.0000 mg | Status: AC
  Administered 2016-02-16: 80 mg

## 2016-02-16 SURGICAL SUPPLY — 7 items
CABLE SURGICAL S-101-97-12 (CABLE) ×1 IMPLANT
LEAD CAPSURE NOVUS 45CM (Lead) ×1 IMPLANT
LEAD CAPSURE NOVUS 5076-52CM (Lead) ×1 IMPLANT
PAD DEFIB LIFELINK (PAD) ×1 IMPLANT
PPM ADVISA MRI DR A2DR01 (Pacemaker) ×1 IMPLANT
SHEATH CLASSIC 7F (SHEATH) ×2 IMPLANT
TRAY PACEMAKER INSERTION (PACKS) ×1 IMPLANT

## 2016-02-16 NOTE — Discharge Summary (Signed)
ELECTROPHYSIOLOGY PROCEDURE DISCHARGE SUMMARY    Patient ID: Stacy Mcintosh,  MRN: [id number], DOB/AGE: 1955-04-26 60 y.o.  Admit date: 02/16/2016 Discharge date: 02/17/2016  Primary Care Physician: Deneen Harts, FNP Primary Cardiologist: Shirlee Latch Electrophysiologist: Elberta Fortis  Primary Discharge Diagnosis:  Complete heart block status post pacemaker implantation this admission  Secondary Discharge Diagnosis:  1.  Prior non ischemic cardiomyopathy 2.  Prior PE 3.  Hypothyroidism 4.  Hypertension   Allergies  Allergen Reactions  . Augmentin [Amoxicillin-Pot Clavulanate] Diarrhea    With yeast infection  Has patient had a PCN reaction causing immediate rash, facial/tongue/throat swelling, SOB or lightheadedness with hypotension:Yes Has patient had a PCN reaction causing severe rash involving mucus membranes or skin necrosis:unsure Has patient had a PCN reaction that required hospitalization:No Has patient had a PCN reaction occurring within the last 10 years:Yes If all of the above answers are "NO", then may proceed with Cephalosporin use.  Marland Kitchen Lisinopril Cough     Procedures This Admission:  1.  Implantation of a MDT dual chamber PPM on 02/16/16 by Dr Elberta Fortis.  The patient received a MDT model number Advisa PPM with model number 5076 right atrial lead and 5076 right ventricular lead. There were no immediate post procedure complications. 2.  CXR on 02/17/16 demonstrated no pneumothorax status post device implantation.   Brief HPI: Naw Rosenfeld is a 60 y.o. female was referred to electrophysiology in the outpatient setting for consideration of PPM implantation.  She has had previous LBBB and presented for routine follow up in complete heart block with RBBB escape. Her Coreg was held without improvement in AV conduction.  Risks, benefits, and alternatives to PPM implantation were reviewed with the patient who wished to proceed.   Hospital Course:  The patient was admitted and  underwent implantation of a MDT dual chamber PPM with details as outlined above.  She  was monitored on telemetry overnight which demonstrated sinus rhythm with ventricular pacing.  Left chest was without hematoma or ecchymosis.  The device was interrogated and found to be functioning normally.  CXR was obtained and demonstrated no pneumothorax status post device implantation.  Wound care, arm mobility, and restrictions were reviewed with the patient.  The patient was examined and considered stable for discharge to home.    Physical Exam: Vitals:   02/16/16 1936 02/16/16 2150 02/17/16 0000 02/17/16 0523  BP: (!) 143/58 (!) 160/81 123/79 129/65  Pulse: (!) 101 91 77 78  Resp: 18 20 20 17   Temp: 98.1 F (36.7 C)  98 F (36.7 C) 98.3 F (36.8 C)  TempSrc: Oral  Oral Oral  SpO2: 97% 98% 96% 99%  Weight:    152 lb 1.9 oz (69 kg)  Height:        GEN- The patient is well appearing, alert and oriented x 3 today.   HEENT: normocephalic, atraumatic; sclera clear, conjunctiva pink; hearing intact; oropharynx clear; neck supple  Lungs- Clear to ausculation bilaterally, normal work of breathing.  No wheezes, rales, rhonchi Heart- Regular rate and rhythm (paced) GI- soft, non-tender, non-distended, bowel sounds present  Extremities- no clubbing, cyanosis, or edema; DP/PT/radial pulses 2+ bilaterally MS- no significant deformity or atrophy Skin- warm and dry, no rash or lesion, left chest without hematoma/ecchymosis Psych- euthymic mood, full affect Neuro- strength and sensation are intact   Labs:   Lab Results  Component Value Date   WBC 8.5 02/16/2016   HGB 13.6 02/16/2016   HCT 41.6 02/16/2016   MCV 94.1 02/16/2016  PLT 336 02/16/2016     Recent Labs Lab 02/11/16 1545  NA 136  K 3.5  CL 99*  CO2 29  BUN 14  CREATININE 1.06*  CALCIUM 10.0  GLUCOSE 89    Discharge Medications:    Medication List    TAKE these medications   ALPRAZolam 0.5 MG tablet Commonly known as:   XANAX Take 1 tablet (0.5 mg total) by mouth 3 (three) times daily as needed for anxiety.   aspirin EC 81 MG tablet Take 81 mg by mouth daily at 12 noon.   carvedilol 6.25 MG tablet Commonly known as:  COREG Take 1 tablet (6.25 mg total) by mouth 2 (two) times daily with a meal.   clonazePAM 1 MG tablet Commonly known as:  KLONOPIN Take 1 mg by mouth at bedtime as needed for anxiety.   cyclobenzaprine 10 MG tablet Commonly known as:  FLEXERIL TAKE 1 TABLET (10 MG TOTAL) BY MOUTH AS NEEDED. What changed:  how much to take  how to take this  when to take this  reasons to take this  additional instructions   doxepin 10 MG capsule Commonly known as:  SINEQUAN Take 10 mg by mouth at bedtime as needed (sleep).   furosemide 40 MG tablet Commonly known as:  LASIX TAKE 1 TABLET (40 MG TOTAL) BY MOUTH DAILY.   omeprazole 40 MG capsule Commonly known as:  PRILOSEC Take 1 capsule (40 mg total) by mouth daily.   sacubitril-valsartan 49-51 MG Commonly known as:  ENTRESTO Take 1 tablet by mouth 2 (two) times daily.   simvastatin 20 MG tablet Commonly known as:  ZOCOR Take 1 tablet (20 mg total) by mouth at bedtime.   spironolactone 25 MG tablet Commonly known as:  ALDACTONE Take 1 tablet (25 mg total) by mouth daily.   thyroid 60 MG tablet Commonly known as:  ARMOUR THYROID TAKE 1 TABLET (60 MG TOTAL) BY MOUTH DAILY BEFORE BREAKFAST.   Vitamin D (Ergocalciferol) 50000 units Caps capsule Commonly known as:  DRISDOL Take 1 capsule (50,000 Units total) by mouth every 7 (seven) days. What changed:  when to take this       Disposition:  Discharge Instructions    Diet - low sodium heart healthy    Complete by:  As directed    Increase activity slowly    Complete by:  As directed      Follow-up Information    [facility] [address] [facility] Follow up on 03/02/2016.   Specialty:  Cardiology Why:  at 2:30PM for wound check  Contact information: [address], [address] [place] [id number] [phone]       Orlie Cundari Jorja LoaMartin Jaceyon Strole, MD Follow up on 05/19/2016.   Specialty:  Cardiology Why:  at 11:15AM Contact information: [address] [place] [address] [phone]           Duration of Discharge Encounter: Greater than 30 minutes including physician time.  Signed, Gypsy BalsamAmber Seiler, NP 02/17/2016 7:00 AM  I have seen and examined this patient with Gypsy BalsamAmber Seiler.  Agree with above, note added to reflect my findings.  On exam, regular rhythm, no murmurs, lungs clear. Dual chamber pacemaker placed for complete AV block.  Interrogation shows R wave oversensing and programmed around this.  CXR without abnormality.  Plan for discharge today and follow up in device clinic.    Destynee Stringfellow M. Alma Mohiuddin MD 02/17/2016 8:12 AM

## 2016-02-16 NOTE — Care Management Note (Signed)
Case Management Note  Patient Details  Name: Stacy Mcintosh MRN: [id number] Date of Birth: 10/23/1955  Subjective/Objective:  S/p pacemaker implant, NCM will cont to follow for dc needs.                 Action/Plan:   Expected Discharge Date:                  Expected Discharge Plan:  Home/Self Care  In-House Referral:     Discharge planning Services  CM Consult  Post Acute Care Choice:    Choice offered to:     DME Arranged:    DME Agency:     HH Arranged:    HH Agency:     Status of Service:  In process, will continue to follow  If discussed at Long Length of Stay Meetings, dates discussed:    Additional Comments:  Leone Haven, RN 02/16/2016, 3:21 PM

## 2016-02-16 NOTE — Telephone Encounter (Signed)
New Message  Corrie Dandy calling to get information about the pre certification.  Corrie Dandy stated if she doesn't answer it's okay to lvm  Please f/u

## 2016-02-16 NOTE — Telephone Encounter (Signed)
Gave our billing dept name and number to caller, to discuss pre cert.  Explained that I spoke with Delrae Alfred, in billing dept, this morning and asked her to pre cert procedure. Advised person calling from hospital to call her to discuss.  She is agreeable

## 2016-02-16 NOTE — H&P (Signed)
Stacy Mcintosh presents to the hospital with a history of complete AV block.  She previously had CHF but a recent TTE shows normal EF.  Holding her coreg did not change affect her heart block.  She thus presents to the hospital today for dual chamber pacemaker placement.  On exam, regular rhythm, no murmurs, lungs clear.  Risks and benefits explained.  Risks include but not limited to bleeding, infection, tamponade, pneumothorax.  The patient understands the risks and has agreed to the procedure.  Tyson Parkison Elberta Fortis, MD 02/16/2016 10:16 AM

## 2016-02-16 NOTE — Discharge Instructions (Signed)
° ° °  Supplemental Discharge Instructions for  Pacemaker/Defibrillator Patients  Activity No heavy lifting or vigorous activity with your left/right arm for 6 to 8 weeks.  Do not raise your left/right arm above your head for one week.  Gradually raise your affected arm as drawn below.           __       02/20/16                  02/21/16                02/22/16              02/23/16  NO DRIVING for  1 week   ; you may begin driving on   71/24/58  .  WOUND CARE - Keep the wound area clean and dry.  Do not get this area wet for one week. No showers for one week; you may shower on  02/23/16   . - The tape/steri-strips on your wound will fall off; do not pull them off.  No bandage is needed on the site.  DO  NOT apply any creams, oils, or ointments to the wound area. - If you notice any drainage or discharge from the wound, any swelling or bruising at the site, or you develop a fever > 101? F after you are discharged home, call the office at once.  Special Instructions - You are still able to use cellular telephones; use the ear opposite the side where you have your pacemaker/defibrillator.  Avoid carrying your cellular phone near your device. - When traveling through airports, show security personnel your identification card to avoid being screened in the metal detectors.  Ask the security personnel to use the hand wand. - Avoid arc welding equipment, MRI testing (magnetic resonance imaging), TENS units (transcutaneous nerve stimulators).  Call the office for questions about other devices. - Avoid electrical appliances that are in poor condition or are not properly grounded. - Microwave ovens are safe to be near or to operate.

## 2016-02-17 ENCOUNTER — Ambulatory Visit (HOSPITAL_COMMUNITY): Payer: Managed Care, Other (non HMO)

## 2016-02-17 DIAGNOSIS — I442 Atrioventricular block, complete: Secondary | ICD-10-CM | POA: Diagnosis not present

## 2016-02-17 MED ORDER — CARVEDILOL 6.25 MG PO TABS: 6.2500 mg | ORAL_TABLET | Freq: Two times a day (BID) | ORAL | 3 refills | Status: DC

## 2016-02-17 MED ORDER — OFF THE BEAT BOOK
Freq: Once | Status: AC
  Administered 2016-02-17: 05:00:00 1
  Filled 2016-02-17: qty 1

## 2016-02-17 NOTE — Care Management Note (Signed)
Case Management Note  Patient Details  Name: Stacy Mcintosh MRN: [id number] Date of Birth: 02-20-1956  Subjective/Objective:  S/p pacemaker implant, patient was dc today, no needs.                  Action/Plan:   Expected Discharge Date:                  Expected Discharge Plan:  Home/Self Care  In-House Referral:     Discharge planning Services  CM Consult  Post Acute Care Choice:    Choice offered to:     DME Arranged:    DME Agency:     HH Arranged:    HH Agency:     Status of Service:  Completed, signed off  If discussed at [employer] of Stay Meetings, dates discussed:    Additional Comments:  Leone Haven, RN 02/17/2016, 11:13 AM

## 2016-02-22 ENCOUNTER — Encounter: Payer: Self-pay | Admitting: Cardiology

## 2016-02-29 ENCOUNTER — Other Ambulatory Visit: Payer: Self-pay | Admitting: Nurse Practitioner

## 2016-02-29 DIAGNOSIS — Z1231 Encounter for screening mammogram for malignant neoplasm of breast: Secondary | ICD-10-CM

## 2016-03-02 ENCOUNTER — Ambulatory Visit (INDEPENDENT_AMBULATORY_CARE_PROVIDER_SITE_OTHER): Payer: Managed Care, Other (non HMO) | Admitting: *Deleted

## 2016-03-02 ENCOUNTER — Encounter: Payer: Self-pay | Admitting: *Deleted

## 2016-03-02 DIAGNOSIS — I442 Atrioventricular block, complete: Secondary | ICD-10-CM

## 2016-03-02 LAB — CUP PACEART INCLINIC DEVICE CHECK
Brady Statistic AP VP Percent: 0.09 %
Brady Statistic AS VP Percent: 99.55 %
Brady Statistic RA Percent Paced: 0.09 %
Implantable Lead Implant Date: 20171024
Implantable Lead Location: 753859
Implantable Lead Location: 753860
Implantable Lead Model: 5076
Implantable Lead Model: 5076
Lead Channel Impedance Value: 399 Ohm
Lead Channel Impedance Value: 456 Ohm
Lead Channel Pacing Threshold Pulse Width: 0.4 ms
Lead Channel Pacing Threshold Pulse Width: 0.4 ms
Lead Channel Sensing Intrinsic Amplitude: 16.625 mV
Lead Channel Sensing Intrinsic Amplitude: 25.75 mV
Lead Channel Setting Pacing Amplitude: 3.5 V
Lead Channel Setting Pacing Amplitude: 3.5 V
Lead Channel Setting Pacing Pulse Width: 0.4 ms
MDC IDC LEAD IMPLANT DT: 20171024
MDC IDC MSMT BATTERY REMAINING LONGEVITY: 136 mo
MDC IDC MSMT BATTERY VOLTAGE: 3.14 V
MDC IDC MSMT LEADCHNL RA IMPEDANCE VALUE: 456 Ohm
MDC IDC MSMT LEADCHNL RA PACING THRESHOLD AMPLITUDE: 0.375 V
MDC IDC MSMT LEADCHNL RA SENSING INTR AMPL: 1 mV
MDC IDC MSMT LEADCHNL RA SENSING INTR AMPL: 1.5 mV
MDC IDC MSMT LEADCHNL RV IMPEDANCE VALUE: 513 Ohm
MDC IDC MSMT LEADCHNL RV PACING THRESHOLD AMPLITUDE: 1 V
MDC IDC PG IMPLANT DT: 20171024
MDC IDC SESS DTM: 20171108171334
MDC IDC SET LEADCHNL RV SENSING SENSITIVITY: 2 mV
MDC IDC STAT BRADY AP VS PERCENT: 0 %
MDC IDC STAT BRADY AS VS PERCENT: 0.36 %
MDC IDC STAT BRADY RV PERCENT PACED: 99.68 %

## 2016-03-02 NOTE — Progress Notes (Signed)
Wound check appointment. Steri-strips removed. Wound without redness or edema. Incision edges approximated, wound well healed. Normal device function. Thresholds, sensing, and impedances consistent with implant measurements. Device programmed at 3.5V for extra safety margin until 3 month visit. Histogram distribution appropriate for patient and level of activity. No mode switches. (1) VT-NS episode noted x 8bts. Patient educated about wound care, arm mobility, lifting restrictions. ROV in 3 months with WC.

## 2016-03-03 ENCOUNTER — Telehealth: Payer: Self-pay | Admitting: *Deleted

## 2016-03-03 ENCOUNTER — Encounter: Payer: Self-pay | Admitting: Internal Medicine

## 2016-03-03 NOTE — Telephone Encounter (Signed)
Patient notified

## 2016-03-03 NOTE — Telephone Encounter (Signed)
-----   Message from Will Jorja Loa, MD sent at 03/03/2016  4:35 PM EST ----- Regarding: FW: Medication No problem taking this med. Thanks. ----- Message ----- From: Awilda Metro, [facility] Sent: 03/03/2016   3:35 PM To: Will Jorja Loa, MD Subject: RE: Medication                                 Yes this is fine for her to take - just an active form of folate. It won't interact with any of her other medications.  ----- Message ----- From: Regan Lemming, MD Sent: 03/03/2016   2:54 PM To: [name], RPH Subject: FW: Medication                                 I do not see any problems with this, but I figured I should ask.  Seems like it should be OK? ----- Message ----- From: Sebastian Ache, CMA Sent: 03/02/2016   4:06 PM To: Will Jorja Loa, MD Subject: Medication                                     Hey!!!  This patient wanted to know if it would be ok for her to take "Deplin" bc of the other meds she's taking? She said that her therapist prescribed it. I told her that she should consult her therapist, but she still wanted to make sure you were ok with it.   Thanks,  [name]

## 2016-03-04 ENCOUNTER — Other Ambulatory Visit: Payer: Self-pay | Admitting: Physician Assistant

## 2016-03-04 ENCOUNTER — Other Ambulatory Visit: Payer: Self-pay | Admitting: Cardiology

## 2016-04-04 ENCOUNTER — Ambulatory Visit
Admission: RE | Admit: 2016-04-04 | Discharge: 2016-04-04 | Disposition: A | Discharge status: Home / Self Care | Payer: Managed Care, Other (non HMO) | Source: Ambulatory Visit | Attending: Nurse Practitioner | Admitting: Nurse Practitioner

## 2016-04-04 DIAGNOSIS — Z1231 Encounter for screening mammogram for malignant neoplasm of breast: Secondary | ICD-10-CM

## 2016-04-05 ENCOUNTER — Other Ambulatory Visit: Payer: Self-pay | Admitting: Physician Assistant

## 2016-04-21 ENCOUNTER — Telehealth (HOSPITAL_COMMUNITY): Payer: Self-pay | Admitting: Cardiology

## 2016-04-21 NOTE — Telephone Encounter (Signed)
PATIENT CALLED THE CHF CLINIC, WITH CONCERNS ABOUT INCREASED FATIGUE, PACEMAKER NOT WORKING AND PAIN AT INCISION SITE.  PATIENT ADVISED TO FOLLOW UP WITH DR CAMNITZ OFFICE FOR FURTHER EVALUATION OF DEVICE   PATIENT VOICED UNDERSTANDING

## 2016-04-22 ENCOUNTER — Other Ambulatory Visit (HOSPITAL_COMMUNITY): Payer: Self-pay

## 2016-04-22 MED ORDER — CARVEDILOL 6.25 MG PO TABS: 6.2500 mg | ORAL_TABLET | Freq: Two times a day (BID) | ORAL | 3 refills | Status: DC

## 2016-04-26 ENCOUNTER — Telehealth: Payer: Self-pay

## 2016-04-26 MED ORDER — FUROSEMIDE 40 MG PO TABS: 40.0000 mg | ORAL_TABLET | Freq: Every day | ORAL | 3 refills | Status: DC

## 2016-04-27 NOTE — Telephone Encounter (Signed)
error 

## 2016-05-19 ENCOUNTER — Encounter: Payer: Self-pay | Admitting: Cardiology

## 2016-05-19 ENCOUNTER — Ambulatory Visit
Admission: RE | Admit: 2016-05-19 | Discharge: 2016-05-19 | Disposition: A | Discharge status: Home / Self Care | Payer: Managed Care, Other (non HMO) | Source: Ambulatory Visit | Attending: Cardiology | Admitting: Cardiology

## 2016-05-19 ENCOUNTER — Ambulatory Visit (INDEPENDENT_AMBULATORY_CARE_PROVIDER_SITE_OTHER): Payer: Managed Care, Other (non HMO) | Admitting: Cardiology

## 2016-05-19 VITALS — BP 140/86 | HR 85 | Ht 60.0 in | Wt 160.0 lb

## 2016-05-19 DIAGNOSIS — I442 Atrioventricular block, complete: Secondary | ICD-10-CM

## 2016-05-19 DIAGNOSIS — Z950 Presence of cardiac pacemaker: Secondary | ICD-10-CM

## 2016-05-19 DIAGNOSIS — R0602 Shortness of breath: Secondary | ICD-10-CM | POA: Diagnosis not present

## 2016-05-19 LAB — CUP PACEART INCLINIC DEVICE CHECK
Battery Remaining Longevity: 122 mo
Brady Statistic AP VS Percent: 0 %
Brady Statistic AS VP Percent: 97.96 %
Brady Statistic AS VS Percent: 0.53 %
Date Time Interrogation Session: 20180125153819
Implantable Lead Implant Date: 20171024
Implantable Lead Implant Date: 20171024
Implantable Lead Location: 753859
Implantable Lead Model: 5076
Lead Channel Impedance Value: 475 Ohm
Lead Channel Impedance Value: 494 Ohm
Lead Channel Pacing Threshold Amplitude: 1 V
Lead Channel Pacing Threshold Pulse Width: 0.4 ms
Lead Channel Setting Pacing Amplitude: 2.5 V
Lead Channel Setting Pacing Pulse Width: 0.4 ms
Lead Channel Setting Sensing Sensitivity: 2 mV
MDC IDC LEAD LOCATION: 753860
MDC IDC MSMT BATTERY VOLTAGE: 3.05 V
MDC IDC MSMT LEADCHNL RA IMPEDANCE VALUE: 380 Ohm
MDC IDC MSMT LEADCHNL RA PACING THRESHOLD AMPLITUDE: 0.5 V
MDC IDC MSMT LEADCHNL RA PACING THRESHOLD PULSEWIDTH: 0.4 ms
MDC IDC MSMT LEADCHNL RA SENSING INTR AMPL: 0.875 mV
MDC IDC MSMT LEADCHNL RV IMPEDANCE VALUE: 589 Ohm
MDC IDC MSMT LEADCHNL RV SENSING INTR AMPL: 13 mV
MDC IDC PG IMPLANT DT: 20171024
MDC IDC SET LEADCHNL RA PACING AMPLITUDE: 2 V
MDC IDC STAT BRADY AP VP PERCENT: 1.51 %
MDC IDC STAT BRADY RA PERCENT PACED: 1.5 %
MDC IDC STAT BRADY RV PERCENT PACED: 99.45 %

## 2016-05-19 NOTE — Patient Instructions (Signed)
Medication Instructions:    Your physician recommends that you continue on your current medications as directed. Please refer to the Current Medication list given to you today.  --- If you need a refill on your cardiac medications before your next appointment, please call your pharmacy. ---  Labwork:  None ordered  Testing/Procedures:  A chest x-ray takes a picture of the organs and structures inside the chest, including the heart, lungs, and blood vessels. This test can show several things, including, whether the heart is enlarges; whether fluid is building up in the lungs; and whether pacemaker / defibrillator leads are still in place.  Follow-Up: Remote monitoring is used to monitor your Pacemaker of ICD from home. This monitoring reduces the number of office visits required to check your device to one time per year. It allows [place] to keep an eye on the functioning of your device to ensure it is working properly. You are scheduled for a device check from home on 08/18/2016. You may send your transmission at any time that day. If you have a wireless device, the transmission will be sent automatically. After your physician reviews your transmission, you will receive a postcard with your next transmission date.   Your physician wants you to follow-up in: 9 months with Dr. Elberta Fortis.  You will receive a reminder letter in the mail two months in advance. If you don't receive a letter, please call our office to schedule the follow-up appointment.  Thank you for choosing [facility]!!   Dory Horn, RN [phone]

## 2016-05-19 NOTE — Progress Notes (Signed)
Electrophysiology Office Note   Date:  05/19/2016   ID:  Stacy Mcintosh, DOB 1955/05/31, MRN [id number]  PCP:  Deneen Harts, FNP  Cardiologist:  Shirlee Latch Primary Electrophysiologist:  Regan Lemming, MD    Chief Complaint  Patient presents with  . Pacemaker Check    Complete heart block     History of Present Illness: Stacy Mcintosh is a 61 y.o. female who presents today for electrophysiology evaluation.   history of hypothyroidism, nonischemic cardiomyopathy, and PE. Presented to heart failure clinic and on routine EKG was found to be in heart block with a right bundle branch block morphology. She has a history of left bundle branch block in the past. She is on carvedilol, and does not wish at this time to have a pacemaker placed. She says she has been under quite a bit of stress due to family illnesses and a stressful marriage. Otherwise she has been feeling well with out any issues. She does not have overt shortness of breath or chest pain.   Today, she denies symptoms of lower extremity edema, claudication, dizziness, presyncope, syncope, bleeding, or neurologic sequela. The patient is tolerating medications without difficulties. She says that she is having some discomfort over her pacemaker site. She is having tingling and at times pain. She also has been having episodes of shortness of breath with shortness of breath when she lies down at night.   Past Medical History:  Diagnosis Date  . Anxiety   . Arthritis    "lower back" (02/16/2016)  . BUNDLE BRANCH BLOCK, LEFT 11/12/2008  . CARDIOMYOPATHY 11/12/2008  . CHF (congestive heart failure) (HCC)    a.Echo 5/17: EF 35-40%, moderate MR, mild LAE, mild to moderate TR, PASP 50 mmHg  . Chronic lower back pain   . Depression   . DVT (deep venous thrombosis) (HCC) 2008   LLE  . Graves' disease    S/P radiation; "now up/down"  . HYPERTENSION 10/16/2007  . Hypothyroidism    h/o Graves Disease  . Migraine    "controlled  w/piercings" (02/16/2016)  . Other and unspecified hyperlipidemia 01/19/2010  . PE (pulmonary embolism) 2008   Factor V negative  . Presence of permanent cardiac pacemaker   . Pure hypercholesterolemia 11/18/2008   Past Surgical History:  Procedure Laterality Date  . ABDOMINAL HYSTERECTOMY  1990s  . CARDIAC CATHETERIZATION  2009?  . CESAREAN SECTION  1974; 198j0; 1987   x3  . EP IMPLANTABLE DEVICE N/A 02/16/2016   Procedure: Pacemaker Implant;  Surgeon: Ricco Dershem Jorja Loa, MD;  Location: [facility] INVASIVE [facility];  Service: Cardiovascular;  Laterality: N/A;  . HERNIA REPAIR  1956  . INSERT / REPLACE / REMOVE PACEMAKER       Current Outpatient Prescriptions  Medication Sig Dispense Refill  . ALPRAZolam (XANAX) 0.5 MG tablet Take 1 tablet (0.5 mg total) by mouth 3 (three) times daily as needed for anxiety. 30 tablet 0  . aspirin EC 81 MG tablet Take 81 mg by mouth daily at 12 noon.    . carvedilol (COREG) 6.25 MG tablet Take 1 tablet (6.25 mg total) by mouth 2 (two) times daily with a meal. 60 tablet 3  . clonazePAM (KLONOPIN) 1 MG tablet Take 1 mg by mouth at bedtime as needed for anxiety.     . cyclobenzaprine (FLEXERIL) 10 MG tablet TAKE 1 TABLET BY MOUTH AS NEEDED 30 tablet 0  . doxepin (SINEQUAN) 10 MG capsule Take 10 mg by mouth at bedtime as needed (sleep).    Marland Kitchen  doxepin (SINEQUAN) 10 MG capsule TAKE ONE CAPSULE BY MOUTH AT BEDTIME AS NEEDED 30 capsule 0  . furosemide (LASIX) 40 MG tablet Take 1 tablet (40 mg total) by mouth daily. 30 tablet 3  . omeprazole (PRILOSEC) 40 MG capsule Take 1 capsule (40 mg total) by mouth daily. 90 capsule 3  . sacubitril-valsartan (ENTRESTO) 49-51 MG Take 1 tablet by mouth 2 (two) times daily. 180 tablet 6  . simvastatin (ZOCOR) 20 MG tablet Take 1 tablet (20 mg total) by mouth at bedtime. 90 tablet 3  . spironolactone (ALDACTONE) 25 MG tablet Take 1 tablet (25 mg total) by mouth daily. 90 tablet 3  . thyroid (ARMOUR THYROID) 60 MG tablet TAKE 1 TABLET  (60 MG TOTAL) BY MOUTH DAILY BEFORE BREAKFAST. 90 tablet 3  . Vitamin D, Ergocalciferol, (DRISDOL) 50000 units CAPS capsule Take 1 capsule (50,000 Units total) by mouth every 7 (seven) days. (Patient taking differently: Take 50,000 Units by mouth every Sunday. ) 30 capsule 1   No current facility-administered medications for this visit.     Allergies:   Augmentin [amoxicillin-pot clavulanate] and Lisinopril   Social History:  The patient  reports that she has never smoked. She has never used smokeless tobacco. She reports that she drinks alcohol. She reports that she does not use drugs.   Family History:  The patient's family history includes Arthritis in her sister; Diabetes in her brother; Heart attack in her brother and mother; Hypertension in her mother and sister; Ovarian cancer in her mother; Prostate cancer in her father; Rheumatologic disease in her mother; Sudden death in her maternal grandfather; Thyroid disease in her brother.    ROS:  Please see the history of present illness.   Otherwise, review of systems is positive for Fatigue, chest pain, shortness of breath at night, chest pressure, palpitations, dyspnea on exertion, snoring, depression, anxiety, difficulty urinating, back pain, muscle pain, dizziness, headaches.   All other systems are reviewed and negative.    PHYSICAL EXAM: VS:   Vitals:   05/19/16 1130  BP: 140/86  Pulse: 85    GEN: Well nourished, well developed, in no acute distress  HEENT: normal  Neck: no JVD, carotid bruits, or masses Cardiac: RRR; no murmurs, rubs, or gallops,no edema  Respiratory:  clear to auscultation bilaterally, normal work of breathing GI: soft, nontender, nondistended, + BS MS: no deformity or atrophy  Skin: warm and dry, device site well healed, keloid formation over the scar Neuro:  Strength and sensation are intact Psych: euthymic mood, full affect  EKG:  EKG is ordered today. Personal review of the ekg ordered shows sinus  rhythm, V paced   Device interrogation is reviewed today in detail.  See PaceArt for details.  Recent Labs: 08/11/2015: ALT 36 12/15/2015: B Natriuretic Peptide 24.8; TSH 0.329 02/11/2016: BUN 14; Creatinine, Ser 1.06; Potassium 3.5; Sodium 136 02/16/2016: Hemoglobin 13.6; Platelets 336    Lipid Panel     Component Value Date/Time   CHOL 195 09/17/2015 1301   TRIG 91 09/17/2015 1301   HDL 72 09/17/2015 1301   CHOLHDL 2.7 09/17/2015 1301   VLDL 18 09/17/2015 1301   LDLCALC 105 (H) 09/17/2015 1301   LDLDIRECT 140.8 04/23/2012 1011     Wt Readings from Last 3 Encounters:  05/19/16 160 lb (72.6 kg)  02/17/16 152 lb 1.9 oz (69 kg)  02/15/16 155 lb 4 oz (70.4 kg)      Other studies Reviewed: Additional studies/ records that were reviewed today include:  TTE 02/12/16 Review of the above records today demonstrates:  - Left ventricle: The cavity size was normal. There was mild focal   basal hypertrophy of the septum. Systolic function was normal.   The estimated ejection fraction was in the range of 60% to 65%.   Wall motion was normal; there were no regional wall motion   abnormalities. Doppler parameters are consistent with abnormal   left ventricular relaxation (grade 1 diastolic dysfunction).   There was no evidence of elevated ventricular filling pressure by   Doppler parameters. - Aortic valve: Trileaflet; normal thickness leaflets. There was no   regurgitation. - Aortic root: The aortic root was normal in size. - Mitral valve: Structurally normal valve. There was mild   regurgitation. - Right ventricle: Systolic function was normal. - Right atrium: The atrium was normal in size. - Tricuspid valve: There was mild regurgitation. - Pulmonary arteries: Systolic pressure was within the normal   range. - Inferior vena cava: The vessel was normal in size.   ASSESSMENT AND PLAN:  1.  Complete AV block: Pacemaker placed 02/12/16. Her P waves of dropped and are now less than  1. She was under sensing at her prior settings, but her device settings have since been adjusted. It is possible that her under sensing was causing her to be short of breath. Due to the drop in P waves, we'll get a chest x-ray to make sure the lead has not moved. I have told her that she can put creams over the wound to see if this Stacy Mcintosh help with some of her discomfort.  2. Nonischemic cardiomyopathy: Has had a stable ejection fraction and is on optimal medical therapy with Coreg and Entresto. Continue current management per heart failure    Current medicines are reviewed at length with the patient today.   The patient does not have concerns regarding her medicines.  The following changes were made today:  Hold coreg  Labs/ tests ordered today include:  Orders Placed This Encounter  Procedures  . DG Chest 2 View  . EKG 12-Lead     Disposition:   FU with Stacy Mcintosh 9 months  Signed, Balen Woolum Jorja Loa, MD  05/19/2016 12:11 PM     [facility] [address] [place] [address] [phone] (office) [phone] (fax)

## 2016-06-06 ENCOUNTER — Telehealth: Payer: Self-pay | Admitting: *Deleted

## 2016-06-06 MED ORDER — CARVEDILOL 12.5 MG PO TABS: 12.5000 mg | ORAL_TABLET | Freq: Two times a day (BID) | ORAL | 3 refills | Status: DC

## 2016-06-06 NOTE — Telephone Encounter (Signed)
Advised pt to increase Carvedilol to 12.5 mg BID for NSVT episodes on monitor report, per Dr. Elberta Fortis. (this is a late advisement from November monitor. Patient verbalized understanding and agreeable to plan.

## 2016-07-08 ENCOUNTER — Other Ambulatory Visit: Payer: Self-pay | Admitting: Physician Assistant

## 2016-07-25 ENCOUNTER — Telehealth: Payer: Self-pay | Admitting: Cardiology

## 2016-07-25 NOTE — Telephone Encounter (Signed)
Error

## 2016-08-22 ENCOUNTER — Other Ambulatory Visit (HOSPITAL_COMMUNITY): Payer: Self-pay | Admitting: Cardiology

## 2016-08-27 ENCOUNTER — Other Ambulatory Visit: Payer: Self-pay | Admitting: Cardiology

## 2016-08-28 ENCOUNTER — Other Ambulatory Visit: Payer: Self-pay | Admitting: Physician Assistant

## 2016-08-29 ENCOUNTER — Ambulatory Visit (INDEPENDENT_AMBULATORY_CARE_PROVIDER_SITE_OTHER): Payer: 59 | Admitting: *Deleted

## 2016-08-29 DIAGNOSIS — I442 Atrioventricular block, complete: Secondary | ICD-10-CM

## 2016-08-30 NOTE — Progress Notes (Signed)
Remote pacemaker transmission.   

## 2016-08-31 LAB — CUP PACEART REMOTE DEVICE CHECK
Battery Remaining Longevity: 111 mo
Battery Voltage: 3.02 V
Brady Statistic AP VP Percent: 0.72 %
Brady Statistic RV Percent Paced: 99.17 %
Date Time Interrogation Session: 20180507191517
Implantable Lead Implant Date: 20171024
Implantable Lead Location: 753859
Implantable Lead Model: 5076
Implantable Lead Model: 5076
Implantable Pulse Generator Implant Date: 20171024
Lead Channel Impedance Value: 437 Ohm
Lead Channel Impedance Value: 532 Ohm
Lead Channel Pacing Threshold Amplitude: 0.5 V
Lead Channel Sensing Intrinsic Amplitude: 1 mV
Lead Channel Setting Pacing Amplitude: 2 V
Lead Channel Setting Pacing Amplitude: 2.5 V
Lead Channel Setting Sensing Sensitivity: 2 mV
MDC IDC LEAD IMPLANT DT: 20171024
MDC IDC LEAD LOCATION: 753860
MDC IDC MSMT LEADCHNL RA IMPEDANCE VALUE: 380 Ohm
MDC IDC MSMT LEADCHNL RA IMPEDANCE VALUE: 437 Ohm
MDC IDC MSMT LEADCHNL RA PACING THRESHOLD PULSEWIDTH: 0.4 ms
MDC IDC MSMT LEADCHNL RA SENSING INTR AMPL: 1 mV
MDC IDC MSMT LEADCHNL RV PACING THRESHOLD AMPLITUDE: 0.75 V
MDC IDC MSMT LEADCHNL RV PACING THRESHOLD PULSEWIDTH: 0.4 ms
MDC IDC MSMT LEADCHNL RV SENSING INTR AMPL: 13.75 mV
MDC IDC MSMT LEADCHNL RV SENSING INTR AMPL: 13.75 mV
MDC IDC SET LEADCHNL RV PACING PULSEWIDTH: 0.4 ms
MDC IDC STAT BRADY AP VS PERCENT: 0 %
MDC IDC STAT BRADY AS VP PERCENT: 98.52 %
MDC IDC STAT BRADY AS VS PERCENT: 0.76 %
MDC IDC STAT BRADY RA PERCENT PACED: 0.72 %

## 2016-09-02 ENCOUNTER — Encounter: Payer: Self-pay | Admitting: Cardiology

## 2016-09-06 ENCOUNTER — Telehealth: Payer: Self-pay | Admitting: Cardiology

## 2016-09-06 NOTE — Telephone Encounter (Signed)
Remote transmission from today reviewed. Presenting rhythm: AsVp. No episodes recorded. Histogram distribution appropriate for patient's activity. Stable device function.  Spoke to patient about recent sx's of fatigue. I informed her that her device function is stable and that she hasn't had any episodes. Patient verbalized understanding. She stated that she recently had some changes in her thyroid medication and wanted to r/o her device as the cause of her fatigue. I told her that the fluctuations in her thyroid level can also cause fatigue. Again, patient verbalized understanding.

## 2016-09-06 NOTE — Telephone Encounter (Signed)
Spoke w/ pt and informed of remote transmission. Pt verbalized understanding. Pt stated that she has been feeling more tired than usual. Instructed pt to send remote transmission and a Device Tech RN will review and call her back.

## 2016-09-06 NOTE — Telephone Encounter (Signed)
New message      Calling to get pacemaker results.  Ok to leave message on vm

## 2016-09-16 ENCOUNTER — Encounter: Payer: Self-pay | Admitting: Cardiology

## 2016-10-13 ENCOUNTER — Telehealth (HOSPITAL_COMMUNITY): Payer: Self-pay

## 2016-10-13 NOTE — Telephone Encounter (Signed)
Stacy Mcintosh has been approved for patient assistance through [employer] for Entresto on 10/13/2016 until 10/13/2017.   Marlinda Mike PharmD Candidate

## 2016-11-04 ENCOUNTER — Other Ambulatory Visit: Payer: Self-pay | Admitting: Physician Assistant

## 2016-11-08 ENCOUNTER — Other Ambulatory Visit: Payer: Self-pay | Admitting: *Deleted

## 2016-11-08 DIAGNOSIS — I10 Essential (primary) hypertension: Secondary | ICD-10-CM

## 2016-11-08 MED ORDER — SIMVASTATIN 20 MG PO TABS: 20.0000 mg | ORAL_TABLET | Freq: Every day | ORAL | 0 refills | Status: DC

## 2016-11-24 ENCOUNTER — Other Ambulatory Visit: Payer: Self-pay | Admitting: Cardiology

## 2016-11-25 ENCOUNTER — Other Ambulatory Visit: Payer: Self-pay | Admitting: Internal Medicine

## 2016-11-28 ENCOUNTER — Ambulatory Visit (INDEPENDENT_AMBULATORY_CARE_PROVIDER_SITE_OTHER): Payer: 59 | Admitting: *Deleted

## 2016-11-28 DIAGNOSIS — I442 Atrioventricular block, complete: Secondary | ICD-10-CM

## 2016-11-29 NOTE — Progress Notes (Signed)
Remote pacemaker transmission.   

## 2016-11-30 ENCOUNTER — Encounter: Payer: Self-pay | Admitting: Cardiology

## 2016-12-15 LAB — CUP PACEART REMOTE DEVICE CHECK
Battery Remaining Longevity: 101 mo
Brady Statistic AS VP Percent: 98.59 %
Brady Statistic RA Percent Paced: 0.48 %
Date Time Interrogation Session: 20180807025216
Implantable Lead Implant Date: 20171024
Implantable Lead Location: 753859
Implantable Lead Location: 753860
Implantable Lead Model: 5076
Implantable Lead Model: 5076
Lead Channel Impedance Value: 380 Ohm
Lead Channel Pacing Threshold Amplitude: 0.5 V
Lead Channel Pacing Threshold Pulse Width: 0.4 ms
Lead Channel Pacing Threshold Pulse Width: 0.4 ms
Lead Channel Sensing Intrinsic Amplitude: 2.5 mV
Lead Channel Sensing Intrinsic Amplitude: 2.5 mV
Lead Channel Sensing Intrinsic Amplitude: 23.375 mV
MDC IDC LEAD IMPLANT DT: 20171024
MDC IDC MSMT BATTERY VOLTAGE: 3.02 V
MDC IDC MSMT LEADCHNL RA IMPEDANCE VALUE: 437 Ohm
MDC IDC MSMT LEADCHNL RV IMPEDANCE VALUE: 418 Ohm
MDC IDC MSMT LEADCHNL RV IMPEDANCE VALUE: 494 Ohm
MDC IDC MSMT LEADCHNL RV PACING THRESHOLD AMPLITUDE: 0.75 V
MDC IDC MSMT LEADCHNL RV SENSING INTR AMPL: 23.375 mV
MDC IDC PG IMPLANT DT: 20171024
MDC IDC SET LEADCHNL RA PACING AMPLITUDE: 2 V
MDC IDC SET LEADCHNL RV PACING AMPLITUDE: 2.5 V
MDC IDC SET LEADCHNL RV PACING PULSEWIDTH: 0.4 ms
MDC IDC SET LEADCHNL RV SENSING SENSITIVITY: 2 mV
MDC IDC STAT BRADY AP VP PERCENT: 0.48 %
MDC IDC STAT BRADY AP VS PERCENT: 0 %
MDC IDC STAT BRADY AS VS PERCENT: 0.93 %
MDC IDC STAT BRADY RV PERCENT PACED: 98.97 %

## 2016-12-19 ENCOUNTER — Encounter: Payer: Self-pay | Admitting: Cardiology

## 2017-01-13 ENCOUNTER — Other Ambulatory Visit: Payer: Self-pay | Admitting: Cardiology

## 2017-01-18 ENCOUNTER — Telehealth (HOSPITAL_COMMUNITY): Payer: Self-pay | Admitting: *Deleted

## 2017-01-18 NOTE — Telephone Encounter (Signed)
Needs to be seen in office, arrange appt with me or APP

## 2017-01-18 NOTE — Telephone Encounter (Signed)
Advanced Heart Failure Triage Encounter  Patient Name: Stacy Mcintosh   Date of Call: 01/18/2017  Problem:  Patient called c/o fatigue, heaviness in chest, and cough. Pt states her cough is so bad she vomits. ICough is associated with shortness of breath that is worse when she lies flat. She is now sleeping on two pillows and has to raise the back of her bed (has adjustable bed). No weight gain or swelling. Patient said she just doesn't feel like herself.  Message routed to Dr.McLean for advice  Plan:    Modesta Messing, CMA

## 2017-01-19 ENCOUNTER — Other Ambulatory Visit (HOSPITAL_COMMUNITY): Payer: Self-pay | Admitting: *Deleted

## 2017-01-19 MED ORDER — FUROSEMIDE 40 MG PO TABS: 40.0000 mg | ORAL_TABLET | Freq: Every day | ORAL | 3 refills | Status: DC

## 2017-01-19 NOTE — Telephone Encounter (Signed)
Spoke w/pt, scheduled appt for tomorrow 9/28 at 9 am, advised pt to take an extra 40 mg of Lasix is evening to see if it helps any, pt agreeable

## 2017-01-20 ENCOUNTER — Encounter (HOSPITAL_COMMUNITY): Payer: Self-pay

## 2017-01-20 ENCOUNTER — Ambulatory Visit (HOSPITAL_COMMUNITY)
Admission: RE | Admit: 2017-01-20 | Discharge: 2017-01-20 | Disposition: A | Discharge status: Home / Self Care | Payer: 59 | Source: Ambulatory Visit | Attending: Cardiology | Admitting: Cardiology

## 2017-01-20 VITALS — BP 108/78 | HR 77 | Wt 165.0 lb

## 2017-01-20 DIAGNOSIS — G43909 Migraine, unspecified, not intractable, without status migrainosus: Secondary | ICD-10-CM | POA: Insufficient documentation

## 2017-01-20 DIAGNOSIS — Z950 Presence of cardiac pacemaker: Secondary | ICD-10-CM | POA: Diagnosis not present

## 2017-01-20 DIAGNOSIS — Z86718 Personal history of other venous thrombosis and embolism: Secondary | ICD-10-CM | POA: Diagnosis not present

## 2017-01-20 DIAGNOSIS — I5022 Chronic systolic (congestive) heart failure: Secondary | ICD-10-CM

## 2017-01-20 DIAGNOSIS — I442 Atrioventricular block, complete: Secondary | ICD-10-CM | POA: Insufficient documentation

## 2017-01-20 DIAGNOSIS — I110 Hypertensive heart disease with heart failure: Secondary | ICD-10-CM | POA: Diagnosis not present

## 2017-01-20 DIAGNOSIS — I429 Cardiomyopathy, unspecified: Secondary | ICD-10-CM | POA: Diagnosis present

## 2017-01-20 DIAGNOSIS — E039 Hypothyroidism, unspecified: Secondary | ICD-10-CM | POA: Insufficient documentation

## 2017-01-20 DIAGNOSIS — R0683 Snoring: Secondary | ICD-10-CM | POA: Insufficient documentation

## 2017-01-20 DIAGNOSIS — E7800 Pure hypercholesterolemia, unspecified: Secondary | ICD-10-CM | POA: Insufficient documentation

## 2017-01-20 DIAGNOSIS — E785 Hyperlipidemia, unspecified: Secondary | ICD-10-CM | POA: Diagnosis not present

## 2017-01-20 DIAGNOSIS — I428 Other cardiomyopathies: Secondary | ICD-10-CM | POA: Diagnosis not present

## 2017-01-20 LAB — BASIC METABOLIC PANEL
ANION GAP: 9 (ref 5–15)
BUN: 10 mg/dL (ref 6–20)
CALCIUM: 9.5 mg/dL (ref 8.9–10.3)
CO2: 27 mmol/L (ref 22–32)
Chloride: 99 mmol/L — ABNORMAL LOW (ref 101–111)
Creatinine, Ser: 1.07 mg/dL — ABNORMAL HIGH (ref 0.44–1.00)
GFR, EST NON AFRICAN AMERICAN: 54 mL/min — AB (ref >60)
Glucose, Bld: 119 mg/dL — ABNORMAL HIGH (ref 65–99)
POTASSIUM: 3.6 mmol/L (ref 3.5–5.1)
SODIUM: 135 mmol/L (ref 135–145)

## 2017-01-20 LAB — TSH: TSH: 4.135 u[IU]/mL (ref 0.350–4.500)

## 2017-01-20 NOTE — Progress Notes (Signed)
    ReDS Vest - 01/20/17 1000      ReDS Vest   MR  No   Estimated volume prior to reading Med   Fitting Posture Standing   Height Marker Short   Ruler Value 12   Center Strip Aligned   ReDS Value 17

## 2017-01-20 NOTE — Patient Instructions (Addendum)
Routine lab work today. Will notify you of abnormal results, otherwise no news is good news!  Will schedule you for sleep study at [facility]. Address: [address] [place], [place], [place] [address] Phone: [phone]  ___________________________________________________  ___________________________________________________  Will schedule you for an echocardiogram at [facility]. Address: [address] Floor), [place], [place] [address]  Phone: [phone]  Follow up 6 weeks with Suzzette Righter NP-C.  _______________________________________________________________  _______________________________________________________________  Take all medication as prescribed the day of your appointment. Bring all medications with you to your appointment.  Do the following things EVERYDAY: 1) Weigh yourself in the morning before breakfast. Write it down and keep it in a log. 2) Take your medicines as prescribed 3) Eat low salt foods-Limit salt (sodium) to 2000 mg per day.  4) Stay as active as you can everyday 5) Limit all fluids for the day to less than 2 liters

## 2017-01-20 NOTE — Progress Notes (Signed)
Patient ID: Stacy Mcintosh, female   DOB: 06/02/1955, 61 y.o.   MRN: [id number] PCP: Deneen Harts Cardiology: Dr. Shirlee Latch  61 yo with history of hypothyroidism, nonischemic cardiomyopathy, and PE presents for cardiology followup.  Hypothyroidism has been controlled by [place] thyroid.  She is now seeing Dr. Lucianne Muss for endocrine management.  Echo in 5/17 showed EF 35-40% with diffuse hypokinesis, most recent Echo in 01/2016 with improved EF of 60-65%.   Stacy Mcintosh returns today for HF follow up. She called the office 2 days ago and complained of cough and SOB. She says that she has felt bad since she started taking Entresto and also says that she she hasn't felt well since her pacemaker.  Over the past 3-4 weeks, she is coughing at night and requiring 2-3 pillows for sleep. Coughing so much that she vomits. Weights at home 154-160 pounds. Feels worse when she is in the 160 range. Her husband says that she snores nightly now. Denies chest pain, but does feel chest heaviness. Feels palpitations frequently. SOB with stairs. Drinking more than 2L a day, eating high salt foods.   ECG: Atrial paced, V sensed.    Labs (10/10): BNP 13, K 3.7, creatinine 0.8 Labs (7/11): K 4.1, creatinine 0.6 Labs (7/12): LDL 143, HDL 74, K 3.3, creatinine 1.0, TSH normal Labs (12/12): K 4, creatinine 0.8, HDL 85, LDL 107 Labs (11/15): K 4.2, creatinine 0.74, LDL 78, HDL 76 Labs (4/17): TSH normal, K 4.5, creatinine 0.92, HCT 42.2 Labs (5/17): LDL 105, HDL 72, BNP 261 Labs (6/17): D dimer negative, BNP 64 Labs (8/17): K 4.2, creatinine 1.05, TSH 0.329 (low), K 4.2, creatinine 1.05, hgb 15.2, BNP 25 Labs (10/17): K 3.5, creatinine 1.06\   Allergies (verified):  1)  ! Lisinopril  Past Medical History: 1. Hypothyroidism and history of Graves disease.  She is status post radioactive iodine therapy.  She then developed hypothyroidism, and the hypothyroidism has been difficult to control.   2. Cardiomyopathy,  nonischemic.  The patient had a cardiac MRI done during her hospitalization in July 2009.  This showed moderate LV enlargement and global LV hypokinesis.  There was a septal paradox pattern.  EF was calculated to be 27%.  There was mild left atrial enlargement.  The RV was normal in size and function.  There was no ASD or VSD.  There was no evidence for delayed enhancement pattern  in the LV myocardium suggesting that there was no scar from coronary artery disease and no definite evidence for infiltrative cardiomyopathy.  HIV was negative.  She did have an echo done also in the hospital in July 2009 that showed severe mitral regurgitation.  A TEE done following this showed an EF of 25-30% with severe mitral regurgitation.  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  The patient did have a left and right heart catheterization after treatment for congestive heart failure in January 2010.  There was no angiographic coronary disease.  EF had improved to 55-60% with no significant mitral  regurgitation.  The hemodynamics were totally normal on the rightheart catheterization.  The cardiomyopathy, I think, was likely du eto poorly controlled hypothyroidism.   - Echo (8/10): EF 50-55%, mild LV dilatation, mild-mod MR, PASP 32 - Echo (11/15): EF 30% - Echo (5/17): EF 35-40%, mild-moderate TR, moderate MR, PASP 50 mmHg.  - Echo (10/17): EF 60-65%. Mild TR  3. PE/DVT.  July 2009.  The patient had been very  sedentary prior to this due to her severe hypothyroidism.  She had  normal antithrombin III.  She was negative for factor V Leiden.  She had normal protein C and protein S function.  Now off coumadin.  4. Hypertension. 5. History of left bundle-branch block. 6. Asthma. 7. Migraines. 8. ACEI cough 9. Low back pain: L-spine disease.  10. Lyme disease: Treated in 12/16.   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   Current Outpatient Prescriptions  Medication Sig Dispense Refill  . ALPRAZolam (XANAX) 0.5 MG tablet Take 1 tablet (0.5 mg total) by mouth 3 (three) times daily as needed for anxiety. 30 tablet 0  . aspirin EC 81 MG tablet Take 81 mg by mouth daily at 12 noon.    . carvedilol (COREG) 12.5 MG tablet Take 1 tablet (12.5 mg total) by mouth 2 (two) times daily with a meal. 180 tablet 3  . carvedilol (COREG) 6.25 MG tablet TAKE 1 TABLET (6.25 MG TOTAL) BY MOUTH 2 (TWO) TIMES DAILY WITH A MEAL. 60 tablet 3  . clonazePAM (KLONOPIN) 1 MG tablet Take 1 mg by mouth at bedtime as needed for anxiety.     . cyclobenzaprine (FLEXERIL) 10 MG tablet TAKE 1 TABLET BY MOUTH AS NEEDED 30 tablet 0  . doxepin (SINEQUAN) 10 MG capsule TAKE ONE CAPSULE BY MOUTH AT BEDTIME AS NEEDED 30 capsule 0  . furosemide (LASIX) 40 MG tablet Take 1 tablet (40 mg total) by mouth daily. May take an additional 40mg  daily for fluid. 60 tablet 3  . lamoTRIgine (LAMICTAL) 25 MG tablet Take 25 mg by mouth daily.    Marland Kitchen omeprazole (PRILOSEC) 40 MG capsule Take 1 capsule (40 mg total) by mouth daily. 90 capsule 3  . sacubitril-valsartan (ENTRESTO) 49-51 MG Take 1 tablet by mouth 2 (two) times daily. Needs office visit 60 tablet 0  . simvastatin (ZOCOR) 20 MG tablet Take 1 tablet (20 mg total) by mouth at bedtime. Office visit needed for additional refills 90 tablet 0  . spironolactone (ALDACTONE) 25 MG tablet TAKE 1 TABLET (25 MG TOTAL) BY MOUTH DAILY. 90 tablet 1  . thyroid (ARMOUR THYROID) 60 MG tablet TAKE 1 TABLET (60 MG TOTAL) BY MOUTH DAILY BEFORE BREAKFAST. 90 tablet 3  . Vitamin D, Ergocalciferol, (DRISDOL) 50000 units CAPS capsule Take 1 capsule (50,000  Units total) by mouth every 7 (seven) days. (Patient taking differently: Take 50,000 Units by mouth every Sunday. ) 30 capsule 1   No current facility-administered medications for this encounter.     BP 108/78   Pulse 77   Wt 165 lb (74.8 kg)   SpO2 98%   BMI 32.22 kg/m  General: Well appearing. No resp difficulty. HEENT: Normal Neck: Supple. JVP 5-6. Carotids 2+ bilat; no bruits. No thyromegaly or nodule noted. Cor: PMI nondisplaced. RRR, No M/G/R noted Lungs: CTAB, normal effort. Abdomen: Soft, non-tender, non-distended, no HSM. No bruits or masses. +BS  Extremities: No cyanosis, clubbing, rash, R and LLE no edema.  Neuro: Alert & orientedx3, cranial nerves grossly intact. moves all 4 extremities w/o difficulty. Affect pleasant   Assessment/Plan:  1. Chronic systolic CHF: Patient has a nonischemic cardiomyopathy that was thought to be due  to uncontrolled hypothyroidism. Cardiac MRI in 2009 showed no LGE.  EF improved to 50-55% on echo in 2010. Cath in 2010 showed normal coronaries.  However, EF was back down to 30% in 11/15 and was 35-40%in 5/17.  Most recent Echo 01/2016 EF 60-65%  - NYHA II - She doesn't really complain of SOB with activity, complains of more fatigue and depression symptoms. REDs vest 17%, and does not appear volume overloaded on exam. Continue Lasix 40 mg daily.  - Continue Spiro 25 mg daily - Continue Entresto 49/51 mg BID. Will not increase today as SBP is 109.  - Continue Coreg 12.5 mg BID.  - Will repeat Echo to make sure that EF remains stable.   2. Hypothyroidism  - Follows with Dr. Lucianne Muss. Has not been for follow up recently. I have asked her to make follow up. - TSH today.   3. HYPERCHOLESTEROLEMIA - Continue simvastatin.   4. Pulmonary embolism  - No longer taking Coumadin - Continue daily ASA.   5. Complete heart block - s/p PPM - Device interrogation with <1% Afib.   6. Snoring - Patient complains of daytime fatigue and husband says  that she snores. Will order sleep study.   Follow up in 6 weeks. She has an appointment with Dr. Shirlee Latch already set up.   Little Ishikawa 01/20/2017

## 2017-01-30 ENCOUNTER — Ambulatory Visit (HOSPITAL_COMMUNITY): Payer: 59 | Attending: Internal Medicine

## 2017-01-30 DIAGNOSIS — I447 Left bundle-branch block, unspecified: Secondary | ICD-10-CM | POA: Diagnosis not present

## 2017-01-30 DIAGNOSIS — I110 Hypertensive heart disease with heart failure: Secondary | ICD-10-CM | POA: Diagnosis not present

## 2017-01-30 DIAGNOSIS — I5022 Chronic systolic (congestive) heart failure: Secondary | ICD-10-CM

## 2017-01-30 DIAGNOSIS — I442 Atrioventricular block, complete: Secondary | ICD-10-CM | POA: Insufficient documentation

## 2017-01-30 DIAGNOSIS — E785 Hyperlipidemia, unspecified: Secondary | ICD-10-CM | POA: Insufficient documentation

## 2017-01-30 DIAGNOSIS — R29898 Other symptoms and signs involving the musculoskeletal system: Secondary | ICD-10-CM | POA: Insufficient documentation

## 2017-01-30 DIAGNOSIS — I2699 Other pulmonary embolism without acute cor pulmonale: Secondary | ICD-10-CM | POA: Diagnosis not present

## 2017-01-30 DIAGNOSIS — I428 Other cardiomyopathies: Secondary | ICD-10-CM | POA: Insufficient documentation

## 2017-01-30 MED ORDER — PERFLUTREN LIPID MICROSPHERE
1.0000 mL | INTRAVENOUS | Status: AC | PRN
  Administered 2017-01-30: 1 mL via INTRAVENOUS

## 2017-02-09 ENCOUNTER — Telehealth (HOSPITAL_COMMUNITY): Payer: Self-pay | Admitting: Cardiology

## 2017-02-09 DIAGNOSIS — I509 Heart failure, unspecified: Secondary | ICD-10-CM

## 2017-02-09 DIAGNOSIS — R931 Abnormal findings on diagnostic imaging of heart and coronary circulation: Secondary | ICD-10-CM

## 2017-02-09 NOTE — Telephone Encounter (Signed)
Patient aware. Patient voiced understanding. Order placed

## 2017-02-09 NOTE — Telephone Encounter (Signed)
-----   Message from Little Ishikawa, NP sent at 01/31/2017 12:54 PM EDT ----- Please tell Stacy Mcintosh that there is a small abnormality at the apex of her heart, Dr. Shirlee Latch reviewed personally. He would like for her to have a stress test - treadmill myoview. Thanks.

## 2017-02-10 NOTE — Telephone Encounter (Signed)
User: Trina Ao A Date/time: 02/09/17 3:37 PM  Comment: Called pt and lmsg for her to CB to get scheduled for a myoview.  Context:  Outcome: Left Message  Phone number: [phone] Phone Type: Home Phone  Comm. type: Telephone Call type: Outgoing  Contact: Wilhemina Cash Relation to patient: Self

## 2017-02-13 ENCOUNTER — Encounter (HOSPITAL_COMMUNITY): Payer: 59

## 2017-02-14 NOTE — Progress Notes (Signed)
Electrophysiology Office Note   Date:  02/15/2017   ID:  Stacy Mcintosh, DOB 09-01-1955, MRN [id number]  PCP:  Deneen Harts, FNP  Cardiologist:  Shirlee Latch Primary Electrophysiologist:  Garlon Tuggle Jorja Loa, MD    Chief Complaint  Patient presents with  . Pacemaker Check    Complete heart block     History of Present Illness: Stacy Mcintosh is a 61 y.o. female who presents today for electrophysiology evaluation.   history of hypothyroidism, nonischemic cardiomyopathy, and PE. Presented to heart failure clinic and on routine EKG was found to be in heart block with a right bundle branch block morphology. She has a history of left bundle branch block in the past. She had a Medtronic dual chamber pacemaker implanted 02/16/16.   Today, denies symptoms of palpitations, chest pain, PND, lower extremity edema, claudication, dizziness, presyncope, syncope, bleeding, or neurologic sequela. The patient is tolerating medications without difficulties. She says that her main symptoms are of fatigue and shortness of breath. She has started to prop herself up on pillows at night to sleep. She feels that this could all be due to chronic Lyme disease. She is also been having some nausea and vomiting. She has mentioned this to her primary physician and is currently undergoing workup for this.    Past Medical History:  Diagnosis Date  . Anxiety   . Arthritis    "lower back" (02/16/2016)  . BUNDLE BRANCH BLOCK, LEFT 11/12/2008  . CARDIOMYOPATHY 11/12/2008  . CHF (congestive heart failure) (HCC)    a.Echo 5/17: EF 35-40%, moderate MR, mild LAE, mild to moderate TR, PASP 50 mmHg  . Chronic lower back pain   . Depression   . DVT (deep venous thrombosis) (HCC) 2008   LLE  . Graves' disease    S/P radiation; "now up/down"  . HYPERTENSION 10/16/2007  . Hypothyroidism    h/o Graves Disease  . Migraine    "controlled w/piercings" (02/16/2016)  . Other and unspecified hyperlipidemia 01/19/2010  . PE (pulmonary  embolism) 2008   Factor V negative  . Presence of permanent cardiac pacemaker   . Pure hypercholesterolemia 11/18/2008   Past Surgical History:  Procedure Laterality Date  . ABDOMINAL HYSTERECTOMY  1990s  . CARDIAC CATHETERIZATION  2009?  . CESAREAN SECTION  1974; 198j0; 1987   x3  . EP IMPLANTABLE DEVICE N/A 02/16/2016   Procedure: Pacemaker Implant;  Surgeon: Carlota Philley Jorja Loa, MD;  Location: [facility];  Service: Cardiovascular;  Laterality: N/A;  . HERNIA REPAIR  1956  . INSERT / REPLACE / REMOVE PACEMAKER       Current Outpatient Prescriptions  Medication Sig Dispense Refill  . ALPRAZolam (XANAX) 0.5 MG tablet Take 1 tablet (0.5 mg total) by mouth 3 (three) times daily as needed for anxiety. 30 tablet 0  . aspirin EC 81 MG tablet Take 81 mg by mouth daily at 12 noon.    . carvedilol (COREG) 12.5 MG tablet Take 1 tablet (12.5 mg total) by mouth 2 (two) times daily with a meal. 180 tablet 3  . carvedilol (COREG) 6.25 MG tablet TAKE 1 TABLET (6.25 MG TOTAL) BY MOUTH 2 (TWO) TIMES DAILY WITH A MEAL. 60 tablet 3  . clonazePAM (KLONOPIN) 1 MG tablet Take 1 mg by mouth at bedtime as needed for anxiety.     . cyclobenzaprine (FLEXERIL) 10 MG tablet TAKE 1 TABLET BY MOUTH AS NEEDED 30 tablet 0  . doxepin (SINEQUAN) 10 MG capsule TAKE ONE CAPSULE BY MOUTH AT BEDTIME  AS NEEDED 30 capsule 0  . furosemide (LASIX) 40 MG tablet Take 1 tablet (40 mg total) by mouth daily. May take an additional 40mg  daily for fluid. 60 tablet 3  . lamoTRIgine (LAMICTAL) 25 MG tablet Take 25 mg by mouth daily.    Marland Kitchen omeprazole (PRILOSEC) 40 MG capsule Take 1 capsule (40 mg total) by mouth daily. 90 capsule 3  . sacubitril-valsartan (ENTRESTO) 49-51 MG Take 1 tablet by mouth 2 (two) times daily. Needs office visit 60 tablet 0  . simvastatin (ZOCOR) 20 MG tablet Take 1 tablet (20 mg total) by mouth at bedtime. Office visit needed for additional refills 90 tablet 0  . spironolactone (ALDACTONE) 25 MG tablet  TAKE 1 TABLET (25 MG TOTAL) BY MOUTH DAILY. 90 tablet 1  . thyroid (ARMOUR THYROID) 60 MG tablet TAKE 1 TABLET (60 MG TOTAL) BY MOUTH DAILY BEFORE BREAKFAST. 90 tablet 3  . Vitamin D, Ergocalciferol, (DRISDOL) 50000 units CAPS capsule Take 1 capsule (50,000 Units total) by mouth every 7 (seven) days. (Patient taking differently: Take 50,000 Units by mouth every Sunday. ) 30 capsule 1   No current facility-administered medications for this visit.     Allergies:   Augmentin [amoxicillin-pot clavulanate] and Lisinopril   Social History:  The patient  reports that she has never smoked. She has never used smokeless tobacco. She reports that she drinks alcohol. She reports that she does not use drugs.   Family History:  The patient's family history includes Arthritis in her sister; Diabetes in her brother; Heart attack in her brother and mother; Hypertension in her mother and sister; Ovarian cancer in her mother; Prostate cancer in her father; Rheumatologic disease in her mother; Sudden death in her maternal grandfather; Thyroid disease in her brother.    ROS:  Please see the history of present illness.   Otherwise, review of systems is positive for chest pain, shortness of breath, palpitations, diarrhea, constipation, nausea, anxiety, depression, back pain, muscle pain, dizziness.   All other systems are reviewed and negative.   PHYSICAL EXAM: VS:  BP 110/66   Pulse 79   Ht 5' (1.524 m)   Wt 165 lb 6.4 oz (75 kg)   SpO2 96%   BMI 32.30 kg/m  , BMI Body mass index is 32.3 kg/m. GEN: Well nourished, well developed, in no acute distress  HEENT: normal  Neck: no JVD, carotid bruits, or masses Cardiac: RRR; no murmurs, rubs, or gallops,no edema  Respiratory:  clear to auscultation bilaterally, normal work of breathing GI: soft, nontender, nondistended, + BS MS: no deformity or atrophy  Skin: warm and dry, device site well healed Neuro:  Strength and sensation are intact Psych: euthymic mood,  full affect  EKG:  EKG is not ordered today. Personal review of the ekg ordered 01/20/17 shows A sense, V paced  Personal review of the device interrogation today. Results in Paceart   Recent Labs: 01/20/2017: BUN 10; Creatinine, Ser 1.07; Potassium 3.6; Sodium 135; TSH 4.135    Lipid Panel     Component Value Date/Time   CHOL 195 09/17/2015 1301   TRIG 91 09/17/2015 1301   HDL 72 09/17/2015 1301   CHOLHDL 2.7 09/17/2015 1301   VLDL 18 09/17/2015 1301   LDLCALC 105 (H) 09/17/2015 1301   LDLDIRECT 140.8 04/23/2012 1011     Wt Readings from Last 3 Encounters:  02/15/17 165 lb 6.4 oz (75 kg)  01/20/17 165 lb (74.8 kg)  05/19/16 160 lb (72.6 kg)  Other studies Reviewed: Additional studies/ records that were reviewed today include: TTE 01/30/17 Review of the above records today demonstrates:  - Left ventricle: Poor acoustic windows limit study Definity used.   LVEF is approximately 45% with apical hypokinesis/akinesis. The   cavity size was normal. There was mild concentric hypertrophy.   Doppler parameters are consistent with abnormal left ventricular   relaxation (grade 1 diastolic dysfunction). - Pulmonary arteries: PA peak pressure: 35 mm Hg (S).   ASSESSMENT AND PLAN:  1.  Complete AV block: Medtronic dual chamber pacemaker placed 02/12/16. Device functioning appropriately. No changes at this time.   2. Nonischemic cardiomyopathy: Ejection fraction has improved, though back down to 45% currently. She is taking extra doses of Lasix. Should her ejection fraction continued to decline, she may benefit from upgrade of her device to CRT.  Labs/ tests ordered today include:  No orders of the defined types were placed in this encounter.    Disposition:   FU with Neka Bise 12 months  Signed, Yamil Dougher Jorja LoaMartin Brayten Komar, MD  02/15/2017 11:00 AM     [facility] [address] [place] [address] [phone] (office) [phone] (fax)

## 2017-02-15 ENCOUNTER — Encounter: Payer: Self-pay | Admitting: Cardiology

## 2017-02-15 ENCOUNTER — Ambulatory Visit (INDEPENDENT_AMBULATORY_CARE_PROVIDER_SITE_OTHER): Payer: 59 | Admitting: Cardiology

## 2017-02-15 VITALS — BP 110/66 | HR 79 | Ht 60.0 in | Wt 165.4 lb

## 2017-02-15 DIAGNOSIS — Z45018 Encounter for adjustment and management of other part of cardiac pacemaker: Secondary | ICD-10-CM

## 2017-02-15 DIAGNOSIS — I442 Atrioventricular block, complete: Secondary | ICD-10-CM | POA: Diagnosis not present

## 2017-02-15 DIAGNOSIS — I428 Other cardiomyopathies: Secondary | ICD-10-CM | POA: Diagnosis not present

## 2017-02-15 LAB — CUP PACEART INCLINIC DEVICE CHECK
Battery Remaining Longevity: 99 mo
Battery Voltage: 3.02 V
Brady Statistic AP VP Percent: 0.24 %
Brady Statistic AP VS Percent: 0 %
Brady Statistic AS VP Percent: 99.19 %
Brady Statistic AS VS Percent: 0.57 %
Brady Statistic RA Percent Paced: 0.24 %
Brady Statistic RV Percent Paced: 99.37 %
Date Time Interrogation Session: 20181024110641
Implantable Lead Implant Date: 20171024
Implantable Lead Implant Date: 20171024
Implantable Lead Location: 753859
Implantable Lead Location: 753860
Implantable Lead Model: 5076
Implantable Lead Model: 5076
Implantable Pulse Generator Implant Date: 20171024
Lead Channel Impedance Value: 380 Ohm
Lead Channel Impedance Value: 418 Ohm
Lead Channel Impedance Value: 437 Ohm
Lead Channel Impedance Value: 494 Ohm
Lead Channel Pacing Threshold Amplitude: 0.5 V
Lead Channel Pacing Threshold Amplitude: 0.625 V
Lead Channel Pacing Threshold Pulse Width: 0.4 ms
Lead Channel Pacing Threshold Pulse Width: 0.4 ms
Lead Channel Sensing Intrinsic Amplitude: 0.75 mV
Lead Channel Sensing Intrinsic Amplitude: 1.375 mV
Lead Channel Sensing Intrinsic Amplitude: 16.125 mV
Lead Channel Sensing Intrinsic Amplitude: 16.125 mV
Lead Channel Setting Pacing Amplitude: 2 V
Lead Channel Setting Pacing Amplitude: 2.5 V
Lead Channel Setting Pacing Pulse Width: 0.4 ms
Lead Channel Setting Sensing Sensitivity: 2 mV

## 2017-02-15 NOTE — Patient Instructions (Addendum)
Medication Instructions:  Your physician recommends that you continue on your current medications as directed. Please refer to the Current Medication list given to you today.  Labwork: None ordered  Testing/Procedures: None ordered  Follow-Up: Remote monitoring is used to monitor your Pacemaker or ICD from home. This monitoring reduces the number of office visits required to check your device to one time per year. It allows [place] to keep an eye on the functioning of your device to ensure it is working properly. You are scheduled for a device check from home on 02/27/2017. You may send your transmission at any time that day. If you have a wireless device, the transmission will be sent automatically. After your physician reviews your transmission, you will receive a postcard with your next transmission date.  Your physician wants you to follow-up in: 1 year with Dr. Elberta Fortis.  You will receive a reminder letter in the mail two months in advance. If you don't receive a letter, please call our office to schedule the follow-up appointment.  --- If you need a refill on your cardiac medications before your next appointment, please call your pharmacy. ---  Thank you for choosing [facility]!!   Dory Horn, RN [phone]

## 2017-02-22 ENCOUNTER — Other Ambulatory Visit: Payer: Self-pay | Admitting: Cardiology

## 2017-02-24 ENCOUNTER — Other Ambulatory Visit: Payer: Self-pay | Admitting: Cardiology

## 2017-02-27 ENCOUNTER — Ambulatory Visit (INDEPENDENT_AMBULATORY_CARE_PROVIDER_SITE_OTHER): Payer: 59 | Admitting: *Deleted

## 2017-02-27 ENCOUNTER — Ambulatory Visit (HOSPITAL_COMMUNITY)
Admission: RE | Admit: 2017-02-27 | Discharge: 2017-02-27 | Disposition: A | Discharge status: Home / Self Care | Payer: 59 | Source: Ambulatory Visit | Attending: Cardiology | Admitting: Cardiology

## 2017-02-27 VITALS — Wt 166.0 lb

## 2017-02-27 DIAGNOSIS — E039 Hypothyroidism, unspecified: Secondary | ICD-10-CM | POA: Diagnosis present

## 2017-02-27 DIAGNOSIS — Z8042 Family history of malignant neoplasm of prostate: Secondary | ICD-10-CM | POA: Diagnosis not present

## 2017-02-27 DIAGNOSIS — Z8261 Family history of arthritis: Secondary | ICD-10-CM | POA: Diagnosis not present

## 2017-02-27 DIAGNOSIS — Z86718 Personal history of other venous thrombosis and embolism: Secondary | ICD-10-CM | POA: Diagnosis not present

## 2017-02-27 DIAGNOSIS — Z79899 Other long term (current) drug therapy: Secondary | ICD-10-CM | POA: Diagnosis not present

## 2017-02-27 DIAGNOSIS — Z86711 Personal history of pulmonary embolism: Secondary | ICD-10-CM | POA: Diagnosis not present

## 2017-02-27 DIAGNOSIS — E890 Postprocedural hypothyroidism: Secondary | ICD-10-CM | POA: Diagnosis not present

## 2017-02-27 DIAGNOSIS — R0683 Snoring: Secondary | ICD-10-CM | POA: Insufficient documentation

## 2017-02-27 DIAGNOSIS — E7800 Pure hypercholesterolemia, unspecified: Secondary | ICD-10-CM | POA: Insufficient documentation

## 2017-02-27 DIAGNOSIS — Z833 Family history of diabetes mellitus: Secondary | ICD-10-CM | POA: Diagnosis not present

## 2017-02-27 DIAGNOSIS — Z950 Presence of cardiac pacemaker: Secondary | ICD-10-CM | POA: Diagnosis not present

## 2017-02-27 DIAGNOSIS — I442 Atrioventricular block, complete: Secondary | ICD-10-CM | POA: Diagnosis not present

## 2017-02-27 DIAGNOSIS — R0602 Shortness of breath: Secondary | ICD-10-CM

## 2017-02-27 DIAGNOSIS — I5022 Chronic systolic (congestive) heart failure: Secondary | ICD-10-CM | POA: Insufficient documentation

## 2017-02-27 DIAGNOSIS — Z8249 Family history of ischemic heart disease and other diseases of the circulatory system: Secondary | ICD-10-CM | POA: Insufficient documentation

## 2017-02-27 DIAGNOSIS — Z7982 Long term (current) use of aspirin: Secondary | ICD-10-CM | POA: Insufficient documentation

## 2017-02-27 DIAGNOSIS — I110 Hypertensive heart disease with heart failure: Secondary | ICD-10-CM | POA: Diagnosis not present

## 2017-02-27 DIAGNOSIS — I428 Other cardiomyopathies: Secondary | ICD-10-CM | POA: Insufficient documentation

## 2017-02-27 NOTE — Progress Notes (Signed)
Remote pacemaker transmission.   

## 2017-02-27 NOTE — Patient Instructions (Signed)
Your physician has requested that you have a stress echocardiogram. For further information please visit [email or web address] Please follow instruction sheet as given.  Your physician recommends that you schedule a follow-up appointment in: 2 months

## 2017-02-28 NOTE — Progress Notes (Signed)
Patient ID: Stacy Mcintosh, female   DOB: June 08, 1955, 61 y.o.   MRN: [id number] PCP: Deneen Harts Cardiology: Dr. Shirlee Latch  61 yo with history of hypothyroidism, nonischemic cardiomyopathy, and PE.  Hypothyroidism has been controlled by Armour thyroid.  She is now seeing Dr. Lucianne Muss for endocrine management.  Echo in 5/17 showed EF 35-40% with diffuse hypokinesis, repeat echo in 01/2016 with improved EF of 60-65%.   In 10/17, she was found to have complete heart block and a Medtronic dual chamber PPM was placed. Last echo in 10/18 showed EF 45% with apical hypokinesis.    [name] returns today for followup of CHF. She feels poorly generally and says that she has felt this way since PPM implantation.  She is short of breath after walking 100 feet or going up stairs/inclines.  Still working, however. She has 2-pillow orthopnea.  Weight is stable. No chest pain, no palpitations.   Device interrogation: 99% RV pacing, no atrial fibrillation.   ECG (personally reviewed, 9/18):  A-sensed, v-paced  Labs (10/10): BNP 13, K 3.7, creatinine 0.8 Labs (7/11): K 4.1, creatinine 0.6 Labs (7/12): LDL 143, HDL 74, K 3.3, creatinine 1.0, TSH normal Labs (12/12): K 4, creatinine 0.8, HDL 85, LDL 107 Labs (11/15): K 4.2, creatinine 0.74, LDL 78, HDL 76 Labs (4/17): TSH normal, K 4.5, creatinine 0.92, HCT 42.2 Labs (5/17): LDL 105, HDL 72, BNP 261 Labs (6/17): D dimer negative, BNP 64 Labs (8/17): K 4.2, creatinine 1.05, TSH 0.329 (low), K 4.2, creatinine 1.05, hgb 15.2, BNP 25 Labs (10/17): K 3.5, creatinine 1.06 Labs (9/18): K 3.6, creatinine 1.07, TSH normal  Allergies (verified):  1)  ! Lisinopril  Past Medical History: 1. Hypothyroidism and history of Graves disease.  She is status post radioactive iodine therapy.  She then developed hypothyroidism, and the hypothyroidism has been difficult to control.   2. Cardiomyopathy, nonischemic.  The patient had a cardiac MRI done during her  hospitalization in July 2009.  This showed moderate LV enlargement and global LV hypokinesis.  There was a septal paradox pattern.  EF was calculated to be 27%.  There was mild left atrial enlargement.  The RV was normal in size and function.  There was no ASD or VSD.  There was no evidence for delayed enhancement pattern  in the LV myocardium suggesting that there was no scar from coronary artery disease and no definite evidence for infiltrative cardiomyopathy.  HIV was negative.  She did have an echo done also in the hospital in July 2009 that showed severe mitral regurgitation.  A TEE done following this showed an EF of 25-30% with severe mitral regurgitation.  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  The patient did have a left and right heart catheterization after treatment for congestive heart failure in January 2010.  There was no angiographic coronary disease.  EF had improved to 55-60% with no significant mitral  regurgitation.  The hemodynamics were totally normal on the right heart catheterization.  The cardiomyopathy, I think, was likely due to poorly controlled hypothyroidism.   - Echo (8/10): EF 50-55%, mild LV dilatation, mild-mod MR, PASP 32 - Echo (11/15): EF 30% - Echo (5/17): EF 35-40%, mild-moderate TR, moderate MR, PASP 50 mmHg.  - Echo (10/17): EF 60-65%. Mild TR  - Echo (10/18): EF 45%, apical hypokinesis.  3. PE/DVT.  July 2009.  The patient had been very sedentary prior to this due to her severe  hypothyroidism.  She had  normal antithrombin III.  She was negative for factor V Leiden.  She had normal protein C and protein S function.  Now off coumadin.  4. Hypertension. 5. History of left bundle-branch block. 6. Asthma. 7. Migraines. 8. ACEI cough 9. Low back pain: L-spine disease.  10. Lyme disease: Treated in 12/16.  11. Complete heart block with Medtronic dual chamber PPM.   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   Current Outpatient Medications  Medication Sig Dispense Refill  . ALPRAZolam (XANAX) 0.5 MG tablet Take 1 tablet (0.5 mg total) by mouth 3 (three) times daily as needed for anxiety. 30 tablet 0  . aspirin EC 81 MG tablet Take 81 mg by mouth daily at 12 noon.    . carvedilol (COREG) 12.5 MG tablet Take 12.5 mg 2 (two) times daily with a meal by mouth.    . clonazePAM (KLONOPIN) 1 MG tablet Take 1 mg by mouth at bedtime as needed for anxiety.     . cyclobenzaprine (FLEXERIL) 10 MG tablet TAKE 1 TABLET BY MOUTH AS NEEDED 30 tablet 0  . doxepin (SINEQUAN) 10 MG capsule TAKE ONE CAPSULE BY MOUTH AT BEDTIME AS NEEDED 30 capsule 0  . ENTRESTO 49-51 MG TAKE 1 TABLET BY MOUTH 2 (TWO) TIMES DAILY. NEEDS OFFICE VISIT 60 tablet 0  . furosemide (LASIX) 40 MG tablet Take 1 tablet (40 mg total) by mouth daily. May take an additional 40mg  daily for fluid. 60 tablet 3  . lamoTRIgine (LAMICTAL) 25 MG tablet Take 25 mg by mouth daily.    Marland Kitchen omeprazole (PRILOSEC) 40 MG capsule Take 1 capsule (40 mg total) by mouth daily. 90 capsule 3  . simvastatin (ZOCOR) 20 MG tablet Take 1 tablet (20 mg total) by mouth at bedtime. Office visit needed for additional refills 90 tablet 0  . spironolactone (ALDACTONE) 25 MG tablet TAKE 1 TABLET (25 MG TOTAL) BY MOUTH DAILY. 90 tablet 1  . thyroid (ARMOUR THYROID) 60 MG tablet TAKE 1 TABLET (60 MG TOTAL) BY MOUTH DAILY BEFORE BREAKFAST. 90 tablet 3  . Vitamin D, Ergocalciferol, (DRISDOL) 50000 units CAPS capsule Take 1 capsule (50,000 Units total) by mouth every 7 (seven) days. (Patient taking differently: Take 50,000 Units by mouth every Sunday. ) 30 capsule 1   No  current facility-administered medications for this encounter.     Wt 166 lb (75.3 kg)   BMI 32.42 kg/m  General: NAD Neck: No JVD, no thyromegaly or thyroid nodule.  Lungs: Clear to auscultation bilaterally with normal respiratory effort. CV: Nondisplaced PMI.  Heart regular S1/S2, no S3/S4, no murmur.  No peripheral edema.  No carotid bruit.  Normal pedal pulses.  Abdomen: Soft, nontender, no hepatosplenomegaly, no distention.  Skin: Intact without lesions or rashes.  Neurologic: Alert and oriented x 3.  Psych: Normal affect. Extremities: No clubbing or cyanosis.  HEENT: Normal.   Assessment/Plan:  1. Chronic systolic CHF: Patient has a nonischemic cardiomyopathy that was thought initially to be due to uncontrolled hypothyroidism. Cardiac MRI in 2009 showed no LGE.  EF improved to 50-55% on echo in 2010. Cath in 2010 showed normal coronaries.  However, EF was back down to 30% in  11/15 and was 35-40% in 5/17.  Echo in 01/2016 with improvement to EF 60-65%.  However, most recent echo showed fall in EF to 45% with apical wall motion abnormality (10/18).  She is now RV pacing 99% of the time because of PPM implanted for CHB. She has NYHA class III symptoms and has felt short of breath since the time that she got her pacemaker.  It is possible that her symptoms and lower EF are related to chronic RV pacing.  She is not volume overloaded on exam.  - I will need to discuss upgrade to BiV device with Dr. Elberta Fortisamnitz now that EF is down some. It is possible that BiV pacing could help her symptoms and could improve EF.  - Continue Spiro 25 mg daily - Continue Entresto 49/51 mg BID.  - Continue Coreg 12.5 mg BID.  - BMET/BNP today.  - With fall in EF, [facility] but has not had it done yet.  I will try to schedule.  2. Hypothyroidism: Follows with Dr. Lucianne MussKumar. Has not been for followup recently. I have asked her to make follow up.  TSH was normal in 9/18.  3.  HYPERCHOLESTEROLEMIA - Continue simvastatin.  4. Pulmonary embolism: In 2009.  No longer taking Coumadin - Continue daily ASA.  5. Complete heart block: Medtronic PPM, see discussion above.  6. Snoring: Patient complains of daytime fatigue and husband says that she snores. She has sleep study scheduled for December.   Followup in 2 months.   Marca AnconaDalton Mouna Yager 02/28/2017

## 2017-03-01 ENCOUNTER — Telehealth (HOSPITAL_COMMUNITY): Payer: Self-pay | Admitting: Cardiology

## 2017-03-01 ENCOUNTER — Encounter: Payer: Self-pay | Admitting: Cardiology

## 2017-03-01 LAB — CUP PACEART REMOTE DEVICE CHECK
Brady Statistic AP VP Percent: 0.57 %
Brady Statistic AS VP Percent: 98.75 %
Brady Statistic RA Percent Paced: 0.56 %
Implantable Lead Implant Date: 20171024
Implantable Lead Location: 753859
Implantable Lead Model: 5076
Implantable Lead Model: 5076
Lead Channel Impedance Value: 380 Ohm
Lead Channel Impedance Value: 437 Ohm
Lead Channel Impedance Value: 456 Ohm
Lead Channel Pacing Threshold Pulse Width: 0.4 ms
Lead Channel Pacing Threshold Pulse Width: 0.4 ms
Lead Channel Sensing Intrinsic Amplitude: 18 mV
Lead Channel Setting Pacing Amplitude: 2 V
Lead Channel Setting Pacing Amplitude: 2.5 V
Lead Channel Setting Pacing Pulse Width: 0.4 ms
MDC IDC LEAD IMPLANT DT: 20171024
MDC IDC LEAD LOCATION: 753860
MDC IDC MSMT BATTERY REMAINING LONGEVITY: 100 mo
MDC IDC MSMT BATTERY VOLTAGE: 3.01 V
MDC IDC MSMT LEADCHNL RA PACING THRESHOLD AMPLITUDE: 0.5 V
MDC IDC MSMT LEADCHNL RA SENSING INTR AMPL: 2.625 mV
MDC IDC MSMT LEADCHNL RA SENSING INTR AMPL: 2.625 mV
MDC IDC MSMT LEADCHNL RV IMPEDANCE VALUE: 513 Ohm
MDC IDC MSMT LEADCHNL RV PACING THRESHOLD AMPLITUDE: 0.625 V
MDC IDC MSMT LEADCHNL RV SENSING INTR AMPL: 18 mV
MDC IDC PG IMPLANT DT: 20171024
MDC IDC SESS DTM: 20181105210304
MDC IDC SET LEADCHNL RV SENSING SENSITIVITY: 2 mV
MDC IDC STAT BRADY AP VS PERCENT: 0 %
MDC IDC STAT BRADY AS VS PERCENT: 0.69 %
MDC IDC STAT BRADY RV PERCENT PACED: 99.05 %

## 2017-03-02 NOTE — Telephone Encounter (Signed)
User: Trina Ao A Date/time: 03/01/17 11:05 AM  Comment: Called pt and lmsg for her to CB to r/s lexiscan.   Context:  Outcome: Left Message  Phone number: [phone] Phone Type: Home Phone  Comm. type: Telephone Call type: Outgoing  Contact: Wilhemina Cash Relation to patient: Self

## 2017-03-03 ENCOUNTER — Telehealth: Payer: Self-pay | Admitting: *Deleted

## 2017-03-03 ENCOUNTER — Telehealth (HOSPITAL_COMMUNITY): Payer: Self-pay | Admitting: *Deleted

## 2017-03-03 NOTE — Telephone Encounter (Signed)
-----   Message from Will Jorja Loa, MD sent at 03/01/2017 10:11 AM EST ----- Certainly sounds reasonable. i'll have her come in to discuss ----- Message ----- From: Laurey Morale, MD Sent: 02/28/2017  11:53 PM To: Will Jorja Loa, MD  Will, Mrs Bigness has felt short of breath/fatigued since she got her PPM for CHB.  EF is lower.  She is RV pacing 99% of the time.  I wonder if the RV pacing is not causing her symptoms. What do you think about upgrading to BiV pacing for her?

## 2017-03-03 NOTE — Telephone Encounter (Signed)
appt made for 11/27 Patient agreeable to plan.

## 2017-03-03 NOTE — Telephone Encounter (Signed)
Patient given detailed instructions per Myocardial Perfusion Study Information Sheet for the test on 03/06/17 at 7:45. Patient notified to arrive 15 minutes early and that it is imperative to arrive on time for appointment to keep from having the test rescheduled. ° If you need to cancel or reschedule your appointment, please call the office within 24 hours of your appointment. . Patient verbalized understanding.Stacy Mcintosh ° ° ° °

## 2017-03-06 ENCOUNTER — Encounter: Payer: Self-pay | Admitting: Cardiology

## 2017-03-06 ENCOUNTER — Ambulatory Visit (HOSPITAL_COMMUNITY): Payer: 59 | Attending: Cardiology

## 2017-03-06 DIAGNOSIS — R931 Abnormal findings on diagnostic imaging of heart and coronary circulation: Secondary | ICD-10-CM | POA: Insufficient documentation

## 2017-03-06 DIAGNOSIS — I509 Heart failure, unspecified: Secondary | ICD-10-CM | POA: Diagnosis not present

## 2017-03-06 DIAGNOSIS — R9439 Abnormal result of other cardiovascular function study: Secondary | ICD-10-CM | POA: Insufficient documentation

## 2017-03-06 LAB — MYOCARDIAL PERFUSION IMAGING
CHL CUP NUCLEAR SSS: 7
LV sys vol: 30 mL
LVDIAVOL: 73 mL (ref 46–106)
NUC STRESS TID: 1.04
Peak HR: 92 {beats}/min
RATE: 0.28
Rest HR: 73 {beats}/min
SDS: 3
SRS: 6

## 2017-03-06 MED ORDER — TECHNETIUM TC 99M TETROFOSMIN IV KIT
8.6000 | PACK | Freq: Once | INTRAVENOUS | Status: AC | PRN
  Administered 2017-03-06: 8.6 via INTRAVENOUS
  Filled 2017-03-06: qty 9

## 2017-03-06 MED ORDER — TECHNETIUM TC 99M TETROFOSMIN IV KIT
29.6000 | PACK | Freq: Once | INTRAVENOUS | Status: AC | PRN
  Administered 2017-03-06: 29.6 via INTRAVENOUS
  Filled 2017-03-06: qty 30

## 2017-03-06 MED ORDER — REGADENOSON 0.4 MG/5ML IV SOLN
0.4000 mg | Freq: Once | INTRAVENOUS | Status: AC
  Administered 2017-03-06: 0.4 mg via INTRAVENOUS

## 2017-03-08 ENCOUNTER — Other Ambulatory Visit (HOSPITAL_COMMUNITY): Payer: Self-pay

## 2017-03-08 ENCOUNTER — Encounter (HOSPITAL_COMMUNITY): Payer: Self-pay

## 2017-03-08 DIAGNOSIS — I5022 Chronic systolic (congestive) heart failure: Secondary | ICD-10-CM

## 2017-03-20 ENCOUNTER — Telehealth (HOSPITAL_COMMUNITY): Payer: Self-pay | Admitting: *Deleted

## 2017-03-20 NOTE — Telephone Encounter (Signed)
Berkley Harvey #[id number]  Exp. 06/15/2017

## 2017-03-21 ENCOUNTER — Ambulatory Visit (INDEPENDENT_AMBULATORY_CARE_PROVIDER_SITE_OTHER): Payer: 59 | Admitting: Cardiology

## 2017-03-21 ENCOUNTER — Encounter: Payer: Self-pay | Admitting: Cardiology

## 2017-03-21 VITALS — BP 122/82 | HR 112 | Ht 60.0 in | Wt 161.6 lb

## 2017-03-21 DIAGNOSIS — I442 Atrioventricular block, complete: Secondary | ICD-10-CM

## 2017-03-21 DIAGNOSIS — Z45018 Encounter for adjustment and management of other part of cardiac pacemaker: Secondary | ICD-10-CM | POA: Diagnosis not present

## 2017-03-21 DIAGNOSIS — I428 Other cardiomyopathies: Secondary | ICD-10-CM

## 2017-03-21 DIAGNOSIS — I5022 Chronic systolic (congestive) heart failure: Secondary | ICD-10-CM | POA: Diagnosis not present

## 2017-03-21 LAB — CUP PACEART INCLINIC DEVICE CHECK
Battery Voltage: 3.02 V
Brady Statistic AP VP Percent: 0.93 %
Brady Statistic AS VP Percent: 98.11 %
Brady Statistic AS VS Percent: 0.76 %
Implantable Lead Implant Date: 20171024
Implantable Lead Model: 5076
Implantable Pulse Generator Implant Date: 20171024
Lead Channel Impedance Value: 361 Ohm
Lead Channel Impedance Value: 437 Ohm
Lead Channel Impedance Value: 475 Ohm
Lead Channel Pacing Threshold Amplitude: 0.5 V
Lead Channel Pacing Threshold Amplitude: 0.75 V
Lead Channel Pacing Threshold Pulse Width: 0.4 ms
Lead Channel Setting Pacing Amplitude: 2 V
MDC IDC LEAD IMPLANT DT: 20171024
MDC IDC LEAD LOCATION: 753859
MDC IDC LEAD LOCATION: 753860
MDC IDC MSMT BATTERY REMAINING LONGEVITY: 99 mo
MDC IDC MSMT LEADCHNL RA PACING THRESHOLD PULSEWIDTH: 0.4 ms
MDC IDC MSMT LEADCHNL RA SENSING INTR AMPL: 2.5 mV
MDC IDC MSMT LEADCHNL RA SENSING INTR AMPL: 3.375 mV
MDC IDC MSMT LEADCHNL RV IMPEDANCE VALUE: 532 Ohm
MDC IDC MSMT LEADCHNL RV SENSING INTR AMPL: 16.625 mV
MDC IDC MSMT LEADCHNL RV SENSING INTR AMPL: 17.75 mV
MDC IDC SESS DTM: 20181127100540
MDC IDC SET LEADCHNL RV PACING AMPLITUDE: 2.5 V
MDC IDC SET LEADCHNL RV PACING PULSEWIDTH: 0.4 ms
MDC IDC SET LEADCHNL RV SENSING SENSITIVITY: 2 mV
MDC IDC STAT BRADY AP VS PERCENT: 0.2 %
MDC IDC STAT BRADY RA PERCENT PACED: 1.12 %
MDC IDC STAT BRADY RV PERCENT PACED: 98.85 %

## 2017-03-21 NOTE — Progress Notes (Signed)
Electrophysiology Office Note   Date:  03/21/2017   ID:  Stacy Mcintosh, DOB 03/24/1956, MRN [id number]  PCP:  Deneen Hartsodd, [name], FNP  Cardiologist:  Shirlee LatchMcLean Primary Electrophysiologist:  Will Jorja LoaMartin Camnitz, MD    Chief Complaint  Patient presents with  . Pacemaker Check    Complete heart block/ Chonic systolic CHF     History of Present Illness: Stacy Mcintosh is a 61 y.o. female who presents today for electrophysiology evaluation.   history of hypothyroidism, nonischemic cardiomyopathy, and PE. Presented to heart failure clinic and on routine EKG was found to be in heart block with a right bundle branch block morphology. She has a history of left bundle branch block in the past. She had a Medtronic dual chamber pacemaker implanted 02/16/16.  She is RV pacing 99% of the time.  Her ejection fraction has since fallen to 45%.  Myoview shows a possible apical wall motion abnormality.  She has plans for left heart catheterization.  Her ejection fraction on her Myoview was 58%.  Today, denies symptoms of palpitations, chest pain,  orthopnea, PND, lower extremity edema, claudication, dizziness, presyncope, syncope, bleeding, or neurologic sequela. The patient is tolerating medications without difficulties.  She is been having fatigue and shortness of breath since the device was implanted.  Over this past weekend, she came down with some sort of infection where she had fevers chills, cough and GI upset.  She has not been eating or drinking appropriately and is not been able to take her medications due to nausea.   Past Medical History:  Diagnosis Date  . Anxiety   . Arthritis    "lower back" (02/16/2016)  . BUNDLE BRANCH BLOCK, LEFT 11/12/2008  . CARDIOMYOPATHY 11/12/2008  . CHF (congestive heart failure) (HCC)    a.Echo 5/17: EF 35-40%, moderate MR, mild LAE, mild to moderate TR, PASP 50 mmHg  . Chronic lower back pain   . Depression   . DVT (deep venous thrombosis) (HCC) 2008   LLE  . Graves'  disease    S/P radiation; "now up/down"  . HYPERTENSION 10/16/2007  . Hypothyroidism    h/o Graves Disease  . Migraine    "controlled w/piercings" (02/16/2016)  . Other and unspecified hyperlipidemia 01/19/2010  . PE (pulmonary embolism) 2008   Factor V negative  . Presence of permanent cardiac pacemaker   . Pure hypercholesterolemia 11/18/2008   Past Surgical History:  Procedure Laterality Date  . ABDOMINAL HYSTERECTOMY  1990s  . CARDIAC CATHETERIZATION  2009?  . CESAREAN SECTION  1974; 198j0; 1987   x3  . EP IMPLANTABLE DEVICE N/A 02/16/2016   Procedure: Pacemaker Implant;  Surgeon: Will Jorja LoaMartin Camnitz, MD;  Location: [facility];  Service: Cardiovascular;  Laterality: N/A;  . HERNIA REPAIR  1956  . INSERT / REPLACE / REMOVE PACEMAKER       Current Outpatient Medications  Medication Sig Dispense Refill  . ALPRAZolam (XANAX) 0.5 MG tablet Take 1 tablet (0.5 mg total) by mouth 3 (three) times daily as needed for anxiety. 30 tablet 0  . aspirin EC 81 MG tablet Take 81 mg by mouth daily at 12 noon.    . carvedilol (COREG) 12.5 MG tablet Take 12.5 mg 2 (two) times daily with a meal by mouth.    . clonazePAM (KLONOPIN) 1 MG tablet Take 1 mg by mouth at bedtime as needed for anxiety.     . cyclobenzaprine (FLEXERIL) 10 MG tablet Take 10 mg by mouth as needed (take as directed).    .Marland Kitchen  doxepin (SINEQUAN) 10 MG capsule Take 10 mg by mouth at bedtime as needed (Take as directed).    . ENTRESTO 49-51 MG TAKE 1 TABLET BY MOUTH 2 (TWO) TIMES DAILY. NEEDS OFFICE VISIT 60 tablet 0  . furosemide (LASIX) 40 MG tablet Take 1 tablet (40 mg total) by mouth daily. May take an additional 40mg  daily for fluid. 60 tablet 3  . lamoTRIgine (LAMICTAL) 25 MG tablet Take 25 mg by mouth daily.    Marland Kitchen omeprazole (PRILOSEC) 40 MG capsule Take 1 capsule (40 mg total) by mouth daily. 90 capsule 3  . simvastatin (ZOCOR) 20 MG tablet Take 1 tablet (20 mg total) by mouth at bedtime. Office visit needed for  additional refills 90 tablet 0  . spironolactone (ALDACTONE) 25 MG tablet TAKE 1 TABLET (25 MG TOTAL) BY MOUTH DAILY. 90 tablet 1  . thyroid (ARMOUR THYROID) 60 MG tablet TAKE 1 TABLET (60 MG TOTAL) BY MOUTH DAILY BEFORE BREAKFAST. 90 tablet 3  . Vitamin D, Ergocalciferol, (DRISDOL) 50000 units CAPS capsule Take 1 capsule (50,000 Units total) by mouth every 7 (seven) days. 30 capsule 1   No current facility-administered medications for this visit.     Allergies:   Augmentin [amoxicillin-pot clavulanate] and Lisinopril   Social History:  The patient  reports that  has never smoked. she has never used smokeless tobacco. She reports that she drinks alcohol. She reports that she does not use drugs.   Family History:  The patient's family history includes Arthritis in her sister; Diabetes in her brother; Heart attack in her brother and mother; Hypertension in her mother and sister; Ovarian cancer in her mother; Prostate cancer in her father; Rheumatologic disease in her mother; Sudden death in her maternal grandfather; Thyroid disease in her brother.    ROS:  Please see the history of present illness.   Otherwise, review of systems is positive for chills, fever, nausea, diarrhea, dizziness.   All other systems are reviewed and negative.   PHYSICAL EXAM: VS:  BP 122/82   Pulse (!) 112   Ht 5' (1.524 m)   Wt 161 lb 9.6 oz (73.3 kg)   BMI 31.56 kg/m  , BMI Body mass index is 31.56 kg/m. GEN: Well nourished, well developed, in no acute distress  HEENT: normal  Neck: no JVD, carotid bruits, or masses Cardiac: Tachycardic, regular; no murmurs, rubs, or gallops,no edema  Respiratory:  clear to auscultation bilaterally, normal work of breathing GI: soft, nontender, nondistended, + BS MS: no deformity or atrophy  Skin: warm and dry, device site well healed Neuro:  Strength and sensation are intact Psych: euthymic mood, full affect  EKG:  EKG is not ordered today. Personal review of the ekg  ordered 01/20/17 shows A sense, V pace  Personal review of the device interrogation today. Results in [facility]   Recent Labs: 01/20/2017: BUN 10; Creatinine, Ser 1.07; Potassium 3.6; Sodium 135; TSH 4.135    Lipid Panel     Component Value Date/Time   CHOL 195 09/17/2015 1301   TRIG 91 09/17/2015 1301   HDL 72 09/17/2015 1301   CHOLHDL 2.7 09/17/2015 1301   VLDL 18 09/17/2015 1301   LDLCALC 105 (H) 09/17/2015 1301   LDLDIRECT 140.8 04/23/2012 1011     Wt Readings from Last 3 Encounters:  03/21/17 161 lb 9.6 oz (73.3 kg)  03/06/17 166 lb (75.3 kg)  02/27/17 166 lb (75.3 kg)      Other studies Reviewed: Additional studies/ records that were  reviewed today include: TTE 01/30/17 Review of the above records today demonstrates:  - Left ventricle: Poor acoustic windows limit study Definity used.   LVEF is approximately 45% with apical hypokinesis/akinesis. The   cavity size was normal. There was mild concentric hypertrophy.   Doppler parameters are consistent with abnormal left ventricular   relaxation (grade 1 diastolic dysfunction). - Pulmonary arteries: PA peak pressure: 35 mm Hg (S).  Myoview 03/06/17  Nuclear stress EF: 58% with apical akinesis.  There was no ST segment deviation noted during stress.  There is a small defect of mild severity present in the apex location. The defect is non-reversible and consistent with infarct. No ischemia noted.  This is a low risk study.  The left ventricular ejection fraction is normal (55-65%).  ASSESSMENT AND PLAN:  1.  Complete AV block: Chronic dual-chamber pacemaker implanted 01/2016.  Device functioning appropriately.  She has been pacing in the ventricle up to 99% of the time.  Her ejection fraction on her echo is at 45%, though her ejection fraction on her Myoview was 58%.  In the future, she may require a device upgrade.  Risks and benefits include bleeding, tamponade, infection, and pneumothorax.  She understands these risks  and if needed has agreed to the procedure.    2. Nonischemic cardiomyopathy: Ejection fraction at 45% on most recent echo, though increased on Myoview.  Plan for left heart catheterization for further determination.  Labs/ tests ordered today include:  No orders of the defined types were placed in this encounter.    Disposition:   FU with Will Camnitz 3 months  Signed, Will Jorja Loa, MD  03/21/2017 8:51 AM     [facility] [address] [place] [address] [phone] (office) [phone] (fax)

## 2017-03-21 NOTE — Patient Instructions (Signed)
Medication Instructions:    Your physician recommends that you continue on your current medications as directed. Please refer to the Current Medication list given to you today.  - If you need a refill on your cardiac medications before your next appointment, please call your pharmacy.   Labwork:  None ordered  Testing/Procedures:  None ordered  Follow-Up:  Your physician recommends that you schedule a follow-up appointment in: 3 months with Dr. Camnitz.  Thank you for choosing [facility]!!   Sherri Price, RN [phone]         

## 2017-03-23 ENCOUNTER — Ambulatory Visit (HOSPITAL_COMMUNITY): Admit: 2017-03-23 | Payer: 59 | Admitting: Cardiology

## 2017-03-23 ENCOUNTER — Encounter (HOSPITAL_COMMUNITY): Payer: Self-pay

## 2017-03-23 SURGERY — LEFT HEART CATH AND CORONARY ANGIOGRAPHY
Anesthesia: LOCAL

## 2017-03-27 ENCOUNTER — Encounter (HOSPITAL_BASED_OUTPATIENT_CLINIC_OR_DEPARTMENT_OTHER): Payer: 59

## 2017-03-30 ENCOUNTER — Telehealth: Payer: Self-pay | Admitting: *Deleted

## 2017-03-30 NOTE — Telephone Encounter (Signed)
Called pt to follow up.  Cath that was scheduled for last week was cancelled d/t bronchitis. Upon calling pt today she is still very sick. Informed patient that I would follow up with her after she completes her cath. She is agreeable to plan

## 2017-03-30 NOTE — Telephone Encounter (Signed)
Regan Lemming, MD  Laurey Morale, MD  Cc: Baird Lyons, RN        Ok, we can schedule her for upgrade after her cath. Zeda Gangwer can you arrange once her cath has been scheduled?   Previous Messages    ----- Message -----  From: Laurey Morale, MD  Sent: 03/21/2017 10:45 PM  To: Will Jorja Loa, MD, Teresa Coombs, RN   Echo EF probably more accurate than Myoview.    Will try to reschedule her cath (Katrina, can you arrange?)   ----- Message -----  From: Regan Lemming, MD  Sent: 03/21/2017  8:51 AM  To: Laurey Morale, MD   Sounds like she has a left heart cath planned for Thursday. She has been febrile over the weekend and is feeling quite poorly in the office today. Either you or your staff may want to call her to discuss whether or not cath is appropriate. Also, I saw that her EF on her Myoview was 58% as compared to 45% on her echo. Do think that she would still benefit from upgrade? It is unclear to me based on her Myoview EF.

## 2017-04-04 ENCOUNTER — Other Ambulatory Visit: Payer: Self-pay | Admitting: Internal Medicine

## 2017-04-04 ENCOUNTER — Telehealth (HOSPITAL_COMMUNITY): Payer: Self-pay

## 2017-04-04 NOTE — Telephone Encounter (Signed)
Pt canceled cath in Nov due to bronchitis. Called to f/u with pt and get her rescheduled. No answer, left VM.

## 2017-04-12 ENCOUNTER — Ambulatory Visit: Payer: Managed Care, Other (non HMO) | Admitting: Physician Assistant

## 2017-04-14 ENCOUNTER — Ambulatory Visit (INDEPENDENT_AMBULATORY_CARE_PROVIDER_SITE_OTHER): Payer: 59 | Admitting: Physician Assistant

## 2017-04-14 ENCOUNTER — Other Ambulatory Visit: Payer: Self-pay

## 2017-04-14 ENCOUNTER — Encounter: Payer: Self-pay | Admitting: Physician Assistant

## 2017-04-14 VITALS — BP 118/60 | HR 88 | Temp 98.7°F | Resp 18 | Ht 60.0 in | Wt 157.4 lb

## 2017-04-14 DIAGNOSIS — R05 Cough: Secondary | ICD-10-CM | POA: Diagnosis not present

## 2017-04-14 DIAGNOSIS — E890 Postprocedural hypothyroidism: Secondary | ICD-10-CM | POA: Diagnosis not present

## 2017-04-14 DIAGNOSIS — R059 Cough, unspecified: Secondary | ICD-10-CM

## 2017-04-14 MED ORDER — OMEPRAZOLE 40 MG PO CPDR: 40.0000 mg | DELAYED_RELEASE_CAPSULE | Freq: Every day | ORAL | 3 refills | Status: DC

## 2017-04-14 MED ORDER — BENZONATATE 100 MG PO CAPS: 100.0000 mg | ORAL_CAPSULE | Freq: Three times a day (TID) | ORAL | 0 refills | Status: DC | PRN

## 2017-04-14 MED ORDER — THYROID 60 MG PO TABS: ORAL_TABLET | ORAL | 3 refills | Status: AC

## 2017-04-14 MED ORDER — AZELASTINE HCL 0.1 % NA SOLN: 2.0000 | Freq: Two times a day (BID) | NASAL | 0 refills | Status: DC

## 2017-04-14 NOTE — Progress Notes (Signed)
Patient ID: Stacy Mcintosh, female    DOB: [birth year], 61 y.o.   MRN: [id number]  PCP: Daylene Posey, FNP  Chief Complaint  Patient presents with  . Chest Congestion    Pt states she went to another doctor and was Dx with Bronchitis. Pt states the doctors treatment wasn't effective and he didn't give her anything for cough.    Subjective:   Presents for evaluation of cough.  Symptoms began the Saturday after Thanksgiving. Saw Drs. McLean and [employer] regarding CHF and complete heart block. Since I saw her last, in 07/2015, she's had a pacemaker placed. She needs a new lead placed, and due to this current illness, she has to postpone it into 2019, and she has met her deductible for 2018!  She was seen 03/23/2017 at [facility] and diagnosed with bronchitis. Her illness initially included fever, chills, sore throat, laryngitis, nasal congestion, post-nasal drainage, runny nose. Despite her reluctance, due to previous adverse effects, she was prescribed prednisone 60 mg QD x 5 days (diarrhea, nausea, "non-functioning"), azithromycin ("which I really don't tolerate well"-stool incontinence), an albuterol inhaler.  She's been told that she's wheezing, but she feels that she isn't, that the sound is due to air passing by the phlegm in the throat. No chest congestion, SOB. Coughs to gagging. Spits out light yellowish mucous. No fever, chills. "They didn't give me anything for cough, no even perles or anything."   Review of Systems As above.    Patient Active Problem List   Diagnosis Date Noted  . Complete heart block ([place]) 02/16/2016  . Dyspnea 11/06/2015  . Cough 10/01/2015  . Chronic systolic CHF (congestive heart failure) ([place]) 09/18/2015  . Postmenopausal 08/11/2015  . History of Lyme disease 08/11/2015  . Nonischemic cardiomyopathy ([place]) 03/04/2014  . Depression 02/27/2014  . 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 PE (pulmonary embolism) 01/25/2011  . Other postablative  hypothyroidism 10/28/2010  . OTHER AND UNSPECIFIED HYPERLIPIDEMIA 01/19/2010  . PURE HYPERCHOLESTEROLEMIA 11/18/2008  . CARDIOMYOPATHY 11/12/2008  . LBBB (left bundle branch block) 11/12/2008  . Essential hypertension 10/16/2007  . HEADACHE, CHRONIC 10/16/2007     Prior to Admission medications   Medication Sig Start Date End Date Taking? Authorizing Provider  ALPRAZolam Duanne Moron) 0.5 MG tablet Take 1 tablet (0.5 mg total) by mouth 3 (three) times daily as needed for anxiety. 12/18/15  Yes Larey Dresser, MD  aspirin EC 81 MG tablet Take 81 mg by mouth daily at 12 noon.   Yes [provider]  carvedilol (COREG) 12.5 MG tablet Take 12.5 mg 2 (two) times daily with a meal by mouth.   Yes [provider]  clonazePAM (KLONOPIN) 1 MG tablet Take 1 mg by mouth at bedtime as needed for anxiety.  10/12/10  Yes [provider]  cyclobenzaprine (FLEXERIL) 10 MG tablet Take 10 mg by mouth as needed (take as directed).   Yes [provider]  doxepin (SINEQUAN) 10 MG capsule Take 10 mg by mouth at bedtime as needed (Take as directed).   Yes [provider]  ENTRESTO 49-51 MG TAKE 1 TABLET BY MOUTH 2 (TWO) TIMES DAILY. NEEDS OFFICE VISIT 02/22/17  Yes Larey Dresser, MD  furosemide (LASIX) 40 MG tablet Take 1 tablet (40 mg total) by mouth daily. May take an additional 47m daily for fluid. 01/19/17  Yes MLarey Dresser MD  lamoTRIgine (LAMICTAL) 25 MG tablet Take 25 mg by mouth daily.   Yes [provider]  omeprazole (PWinfred  40 MG capsule Take 1 capsule (40 mg total) by mouth daily. 08/13/15  Yes Liberti Appleton, PA-C  simvastatin (ZOCOR) 20 MG tablet Take 1 tablet (20 mg total) by mouth at bedtime. Office visit needed for additional refills 11/08/16  Yes Sayan Aldava, PA-C  spironolactone (ALDACTONE) 25 MG tablet TAKE 1 TABLET BY MOUTH EVERY DAY 04/05/17  Yes Larey Dresser, MD  thyroid (ARMOUR THYROID) 60 MG tablet TAKE 1 TABLET (60 MG TOTAL)  BY MOUTH DAILY BEFORE BREAKFAST. 08/13/15  Yes Josemanuel Eakins, PA-C  Vitamin D, Ergocalciferol, (DRISDOL) 50000 units CAPS capsule Take 1 capsule (50,000 Units total) by mouth every 7 (seven) days. 08/13/15  Yes Harrison Mons, PA-C     Allergies  Allergen Reactions  . Augmentin [Amoxicillin-Pot Clavulanate] Diarrhea    With yeast infection  Has patient had a PCN reaction causing immediate rash, facial/tongue/throat swelling, SOB or lightheadedness with hypotension:Yes Has patient had a PCN reaction causing severe rash involving mucus membranes or skin necrosis:unsure Has patient had a PCN reaction that required hospitalization:No Has patient had a PCN reaction occurring within the last 10 years:Yes If all of the above answers are "NO", then may proceed with Cephalosporin use.  Marland Kitchen Lisinopril Cough       Objective:  Physical Exam  Constitutional: She is oriented to person, place, and time. She appears well-developed and well-nourished. No distress.  BP 118/60 (BP Location: Left Arm, Patient Position: Sitting, Cuff Size: Normal)   Pulse 88   Temp 98.7 F (37.1 C) (Oral)   Resp 18   Ht 5' (1.524 m)   Wt 157 lb 6.4 oz (71.4 kg)   SpO2 96%   BMI 30.74 kg/m    HENT:  Head: Normocephalic and atraumatic.  Right Ear: Hearing, tympanic membrane, external ear and ear canal normal.  Left Ear: Hearing, tympanic membrane, external ear and ear canal normal.  Nose: Mucosal edema and rhinorrhea present.  No foreign bodies. Right sinus exhibits no maxillary sinus tenderness and no frontal sinus tenderness. Left sinus exhibits no maxillary sinus tenderness and no frontal sinus tenderness.  Mouth/Throat: Uvula is midline, oropharynx is clear and moist and mucous membranes are normal. No uvula swelling. No oropharyngeal exudate.  Eyes: Conjunctivae and EOM are normal. Pupils are equal, round, and reactive to light. Right eye exhibits no discharge. Left eye exhibits no discharge. No scleral icterus.    Neck: Trachea normal, normal range of motion and full passive range of motion without pain. Neck supple. No thyroid mass and no thyromegaly present.  Cardiovascular: Normal rate, regular rhythm and normal heart sounds.  Pulmonary/Chest: Effort normal and breath sounds normal.  Lymphadenopathy:       Head (right side): No submandibular, no tonsillar, no preauricular, no posterior auricular and no occipital adenopathy present.       Head (left side): No submandibular, no tonsillar, no preauricular and no occipital adenopathy present.    She has no cervical adenopathy.       Right: No supraclavicular adenopathy present.       Left: No supraclavicular adenopathy present.  Neurological: She is alert and oriented to person, place, and time. She has normal strength. No cranial nerve deficit or sensory deficit.  Skin: Skin is warm, dry and intact. No rash noted.  Psychiatric: She has a normal mood and affect. Her speech is normal and behavior is normal.       Assessment & Plan:   1. Cough I suspect that the cough is due to the post-nasal  drainage, and that the primary issue, at least at this point, is in the sinuses, rather than the lungs. 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 Resume PPI therapy. Would try to change over to H2 blocker once her cough is resolved. - azelastine (ASTELIN) 0.1 % nasal spray; Place 2 sprays into both nostrils 2 (two) times daily. Use in each nostril as directed  Dispense: 30 mL; Refill: 0 - benzonatate (TESSALON) 100 MG capsule; Take 1-2 capsules (100-200 mg total) by mouth 3 (three) times daily as needed for cough.  Dispense: 40 capsule; Refill: 0 - omeprazole (PRILOSEC) 40 MG capsule; Take 1 capsule (40 mg total) by mouth daily.  Dispense: 90 capsule; Refill: 3  2. Postoperative hypothyroidism Needs a refill. - thyroid (ARMOUR THYROID) 60 MG tablet; TAKE 1 TABLET (60 MG TOTAL) BY MOUTH DAILY BEFORE BREAKFAST.   Dispense: 90 tablet; Refill: 3    Return in about 3 months (around 07/13/2017) for re-evalaution of thyroid, mood, etc.   Fara Chute, PA-C [facility] [place]

## 2017-04-14 NOTE — Patient Instructions (Addendum)
Rest. OK to try the [facility]. Continue the Mucinex.    IF you received an x-ray today, you will receive an invoice from [facility]. Please contact [facility] at [phone] with questions or concerns regarding your invoice.   IF you received labwork today, you will receive an invoice from [facility]. Please contact [facility] at [phone] with questions or concerns regarding your invoice.   Our billing staff will not be able to assist you with questions regarding bills from these companies.  You will be contacted with the lab results as soon as they are available. The fastest way to get your results is to activate your My Chart account. Instructions are located on the last page of this paperwork. If you have not heard from [place] regarding the results in 2 weeks, please contact this office.

## 2017-04-22 ENCOUNTER — Ambulatory Visit (INDEPENDENT_AMBULATORY_CARE_PROVIDER_SITE_OTHER): Payer: 59

## 2017-04-22 ENCOUNTER — Other Ambulatory Visit: Payer: Self-pay

## 2017-04-22 ENCOUNTER — Ambulatory Visit (INDEPENDENT_AMBULATORY_CARE_PROVIDER_SITE_OTHER): Payer: 59 | Admitting: Physician Assistant

## 2017-04-22 ENCOUNTER — Encounter: Payer: Self-pay | Admitting: Physician Assistant

## 2017-04-22 VITALS — BP 104/72 | HR 97 | Temp 98.4°F | Resp 18 | Ht 60.0 in | Wt 158.6 lb

## 2017-04-22 DIAGNOSIS — R059 Cough, unspecified: Secondary | ICD-10-CM

## 2017-04-22 DIAGNOSIS — J0100 Acute maxillary sinusitis, unspecified: Secondary | ICD-10-CM

## 2017-04-22 DIAGNOSIS — R05 Cough: Secondary | ICD-10-CM | POA: Diagnosis not present

## 2017-04-22 DIAGNOSIS — J189 Pneumonia, unspecified organism: Secondary | ICD-10-CM

## 2017-04-22 DIAGNOSIS — R918 Other nonspecific abnormal finding of lung field: Secondary | ICD-10-CM

## 2017-04-22 MED ORDER — IPRATROPIUM BROMIDE 0.02 % IN SOLN
0.5000 mg | Freq: Once | RESPIRATORY_TRACT | Status: AC
  Administered 2017-04-22: 0.5 mg via RESPIRATORY_TRACT

## 2017-04-22 MED ORDER — HYDROCOD POLST-CPM POLST ER 10-8 MG/5ML PO SUER: 5.0000 mL | Freq: Two times a day (BID) | ORAL | 0 refills | Status: DC | PRN

## 2017-04-22 MED ORDER — LEVOFLOXACIN 750 MG PO TABS: 750.0000 mg | ORAL_TABLET | Freq: Every day | ORAL | 0 refills | Status: DC

## 2017-04-22 MED ORDER — ALBUTEROL SULFATE (2.5 MG/3ML) 0.083% IN NEBU
2.5000 mg | INHALATION_SOLUTION | Freq: Once | RESPIRATORY_TRACT | Status: AC
  Administered 2017-04-22: 2.5 mg via RESPIRATORY_TRACT

## 2017-04-22 MED ORDER — ALBUTEROL SULFATE (2.5 MG/3ML) 0.083% IN NEBU: 2.5000 mg | INHALATION_SOLUTION | Freq: Four times a day (QID) | RESPIRATORY_TRACT | 12 refills | Status: DC | PRN

## 2017-04-22 NOTE — Patient Instructions (Addendum)
     IF you received an x-ray today, you will receive an invoice from [facility]. Please contact [facility] at [phone] with questions or concerns regarding your invoice.   IF you received labwork today, you will receive an invoice from [facility]. Please contact [facility] at [phone] with questions or concerns regarding your invoice.   Our billing staff will not be able to assist you with questions regarding bills from these companies.  You will be contacted with the lab results as soon as they are available. The fastest way to get your results is to activate your My Chart account. Instructions are located on the last page of this paperwork. If you have not heard from us regarding the results in 2 weeks, please contact this office.     Community-Acquired Pneumonia, Adult Pneumonia is an infection of the lungs. One type of pneumonia can happen while a person is in a hospital. A different type can happen when a person is not in a hospital (community-acquired pneumonia). It is easy for this kind to spread from person to person. It can spread to you if you breathe near an infected person who coughs or sneezes. Some symptoms include:  A dry cough.  A wet (productive) cough.  Fever.  Sweating.  Chest pain.  Follow these instructions at home:  Take over-the-counter and prescription medicines only as told by your doctor. ? Only take cough medicine if you are losing sleep. ? If you were prescribed an antibiotic medicine, take it as told by your doctor. Do not stop taking the antibiotic even if you start to feel better.  Sleep with your head and neck raised (elevated). You can do this by putting a few pillows under your head, or you can sleep in a recliner.  Do not use tobacco products. These include cigarettes, chewing tobacco, and e-cigarettes. If you need help quitting, ask your doctor.  Drink enough water to keep your pee (urine) clear or pale yellow. A shot  (vaccine) can help prevent pneumonia. Shots are often suggested for:  People older than 62 years of age.  People older than 62 years of age: ? Who are having cancer treatment. ? Who have long-term (chronic) lung disease. ? Who have problems with their body's defense system (immune system).  You may also prevent pneumonia if you take these actions:  Get the flu (influenza) shot every year.  Go to the dentist as often as told.  Wash your hands often. If soap and water are not available, use hand sanitizer.  Contact a doctor if:  You have a fever.  You lose sleep because your cough medicine does not help. Get help right away if:  You are short of breath and it gets worse.  You have more chest pain.  Your sickness gets worse. This is very serious if: ? You are an older adult. ? Your body's defense system is weak.  You cough up blood. This information is not intended to replace advice given to you by your health care provider. Make sure you discuss any questions you have with your health care provider. Document Released: 09/28/2007 Document Revised: 09/17/2015 Document Reviewed: 08/06/2014 Elsevier Interactive Patient Education  2018 [employer]  

## 2017-04-22 NOTE — Progress Notes (Signed)
Patient ID: Stacy Mcintosh, female    DOB: [birth year], 61 y.o.   MRN: [id number]  PCP: Porfirio Oar, PA-C  Chief Complaint  Patient presents with  . Cough    follow up still not better   . Depression    screening was a 21   . Dizziness    Subjective:   Presents for evaluation of persistent cough.  Recall that she was initially seen 03/24/27 at another facility.  She was diagnosed with bronchitis and prescribed a azithromycin, oral prednisone, and an albuterol inhaler.  At that time she had fever and chills, sore throat, laryngitis, nasal congestion and postnasal drainage, runny nose and ear discomfort.  She developed stool incontinence with the a azithromycin though she finished it.  Stopped the prednisone partway through due to diarrhea nausea and "nonfunctioning."   I saw here on 04/14/2017 with persistent symptoms.  She had no chest congestion and was not short of breath but would cough to the point of gagging and would spit out a light yellow mucus.  Fever and chills have resolved.  Postnasal drainage persisted.  As she continued to have nasal and sinus pressure she was diagnosed with upper respiratory infection, likely originating in the sinuses.  She was reluctant to take another antibiotic and so her symptoms were addressed supportively with azelastine nasal spray, benzonatate and PPI was resumed, considering possible component of laryngopharyngeal reflux.  She did not resume the prednisone course, has 8 of the 15 tablets left in her bottle. Has been using the nasal spray and the neti pot, tessalon perles (she has 10 left, relating that 200 mg works better, but she only took 1 this morning so I could hear how badly she is coughing), Fisherman[employer], bourbon and honey in her tea (her husband sneaked it in, and it helped for several hours).  Cough is unchanged.  She has not had recurrent fever or chills.  Continues to have a lot of nasal and sinus congestion and pressure and postnasal  drainage.  Has started to experience some dizziness and chest tightness.  No sore throat.  No ear pain.  No nausea or vomiting but she still sometimes coughs to the point of gagging.  Mood is more depressed.  The holidays have been difficult.  Her 5 year old sister has myasthenia gravis and had to return to hospital on Christmas day.  She is in [place].  The patient was able to visit her.  In addition the holiday has been difficult as she continues to grieve the death of her godson.  Her marriage has also been strained for quite some time and as such she does not have a spousal support as she manages her grief.  No suicidal ideations.    Review of Systems As above.   Depression screen [facility] 2/9 04/22/2017 04/14/2017 08/11/2015 01/05/2015 08/28/2014  Decreased Interest 3 0 0 3 3  Down, Depressed, Hopeless 3 0 3 3 3   PHQ - 2 Score 6 0 3 6 6   Altered sleeping 2 - 1 2 1   Tired, decreased energy 3 - 2 1 1   Change in appetite 2 - 2 1 2   Feeling bad or failure about yourself  3 - 1 2 1   Trouble concentrating 3 - 3 2 1   Moving slowly or fidgety/restless 2 - 2 1 1   Suicidal thoughts 0 - 1 0 1  PHQ-9 Score 21 - 15 15 14   Difficult doing work/chores - - - Somewhat difficult Somewhat difficult  Patient Active Problem List   Diagnosis Date Noted  . Complete heart block (HCC) 02/16/2016  . Cough 10/01/2015  . Chronic systolic CHF (congestive heart failure) (HCC) 09/18/2015  . Postmenopausal 08/11/2015  . History of Lyme disease 08/11/2015  . Nonischemic cardiomyopathy (HCC) 03/04/2014  . Depression 02/27/2014  . 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 History of pulmonary embolism 01/25/2011  . Hypothyroidism 10/28/2010  . OTHER AND UNSPECIFIED HYPERLIPIDEMIA 01/19/2010  . PURE HYPERCHOLESTEROLEMIA 11/18/2008  . CARDIOMYOPATHY 11/12/2008  . LBBB (left bundle branch block) 11/12/2008  . Essential hypertension 10/16/2007  . HEADACHE, CHRONIC 10/16/2007     Prior to Admission  medications   Medication Sig Start Date End Date Taking? Authorizing Provider  ALPRAZolam Prudy Feeler) 0.5 MG tablet Take 1 tablet (0.5 mg total) by mouth 3 (three) times daily as needed for anxiety. 12/18/15  Yes Laurey Morale, MD  aspirin EC 81 MG tablet Take 81 mg by mouth daily at 12 noon.   Yes [provider]  azelastine (ASTELIN) 0.1 % nasal spray Place 2 sprays into both nostrils 2 (two) times daily. Use in each nostril as directed 04/14/17  Yes Jovan Colligan, PA-C  benzonatate (TESSALON) 100 MG capsule Take 1-2 capsules (100-200 mg total) by mouth 3 (three) times daily as needed for cough. 04/14/17  Yes Adonijah Baena, PA-C  carvedilol (COREG) 12.5 MG tablet Take 12.5 mg 2 (two) times daily with a meal by mouth.   Yes [provider]  clonazePAM (KLONOPIN) 1 MG tablet Take 1 mg by mouth at bedtime as needed for anxiety.  10/12/10  Yes [provider]  cyclobenzaprine (FLEXERIL) 10 MG tablet Take 10 mg by mouth as needed (take as directed).   Yes [provider]  doxepin (SINEQUAN) 10 MG capsule Take 10 mg by mouth at bedtime as needed (Take as directed).   Yes [provider]  ENTRESTO 49-51 MG TAKE 1 TABLET BY MOUTH 2 (TWO) TIMES DAILY. NEEDS OFFICE VISIT 02/22/17  Yes Laurey Morale, MD  furosemide (LASIX) 40 MG tablet Take 1 tablet (40 mg total) by mouth daily. May take an additional 40mg  daily for fluid. 01/19/17  Yes Laurey Morale, MD  lamoTRIgine (LAMICTAL) 25 MG tablet Take 25 mg by mouth daily.   Yes [provider]  omeprazole (PRILOSEC) 40 MG capsule Take 1 capsule (40 mg total) by mouth daily. 04/14/17  Yes Shaylene Paganelli, PA-C  simvastatin (ZOCOR) 20 MG tablet Take 1 tablet (20 mg total) by mouth at bedtime. Office visit needed for additional refills 11/08/16  Yes Kiaraliz Rafuse, PA-C  spironolactone (ALDACTONE) 25 MG tablet TAKE 1 TABLET BY MOUTH EVERY DAY 04/05/17  Yes Laurey Morale, MD  thyroid (ARMOUR THYROID) 60 MG  tablet TAKE 1 TABLET (60 MG TOTAL) BY MOUTH DAILY BEFORE BREAKFAST. 04/14/17  Yes Floy Riegler, PA-C  Vitamin D, Ergocalciferol, (DRISDOL) 50000 units CAPS capsule Take 1 capsule (50,000 Units total) by mouth every 7 (seven) days. 08/13/15  Yes Porfirio Oar, PA-C     Allergies  Allergen Reactions  . Augmentin [Amoxicillin-Pot Clavulanate] Diarrhea    With yeast infection  Has patient had a PCN reaction causing immediate rash, facial/tongue/throat swelling, SOB or lightheadedness with hypotension:Yes Has patient had a PCN reaction causing severe rash involving mucus membranes or skin necrosis:unsure Has patient had a PCN reaction that required hospitalization:No Has patient had a PCN reaction occurring within the last 10 years:Yes If all of the above answers are "NO", then may proceed with  Cephalosporin use.  Marland Kitchen. Lisinopril Cough       Objective:  Physical Exam  Constitutional: She is oriented to person, place, and time. She appears well-developed and well-nourished. She is active and cooperative. No distress.  BP 104/72   Pulse 97   Temp 98.4 F (36.9 C) (Oral)   Resp 18   Ht 5' (1.524 m)   Wt 158 lb 9.6 oz (71.9 kg)   SpO2 96%   BMI 30.97 kg/m   HENT:  Head: Normocephalic and atraumatic.  Right Ear: Hearing, tympanic membrane, external ear and ear canal normal.  Left Ear: Hearing, tympanic membrane, external ear and ear canal normal.  Nose: Mucosal edema present. No rhinorrhea. Right sinus exhibits maxillary sinus tenderness and frontal sinus tenderness. Left sinus exhibits maxillary sinus tenderness and frontal sinus tenderness.  Mouth/Throat: Uvula is midline, oropharynx is clear and moist and mucous membranes are normal. No oropharyngeal exudate.  Eyes: Conjunctivae are normal. No scleral icterus.  Neck: Normal range of motion. Neck supple. No thyromegaly present.  Cardiovascular: Normal rate, regular rhythm and normal heart sounds.  Pulses:      Radial pulses are 2+  on the right side, and 2+ on the left side.  Pulmonary/Chest: Effort normal. She has wheezes (soft, scattered, resolve with cough).  Lymphadenopathy:       Head (right side): No tonsillar, no preauricular, no posterior auricular and no occipital adenopathy present.       Head (left side): No tonsillar, no preauricular, no posterior auricular and no occipital adenopathy present.    She has no cervical adenopathy.       Right: No supraclavicular adenopathy present.       Left: No supraclavicular adenopathy present.  Neurological: She is alert and oriented to person, place, and time. No sensory deficit.  Skin: Skin is warm, dry and intact. No rash noted. No cyanosis or erythema. Nails show no clubbing.  Psychiatric: She has a normal mood and affect. Her speech is normal and behavior is normal.    Dg Chest 2 View  Result Date: 04/22/2017 CLINICAL DATA:  Cough and wheezing for 6 weeks. EXAM: CHEST  2 VIEW COMPARISON:  None. FINDINGS: The heart size is within normal limits. Mild pulmonary infiltrate is seen in the posterior left lower lobe, suspicious for pneumonia. Right lung is clear. No evidence of pleural effusion. Tracheal deviation is seen to the left. This may be due to substernal goiter, however other mediastinal mass cannot be excluded. Transvenous pacemaker in appropriate position. IMPRESSION: Mild posterior left lower lobe infiltrate, consistent with pneumonia. 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 Recommend chest CT with contrast for further evaluation. These results will be called to the ordering clinician or representative by the Radiologist Assistant, and communication documented in the PACS or zVision Dashboard. Electronically Signed   By: Myles RosenthalJohn  Stahl M.D.   On: 04/22/2017 11:27          Assessment & Plan:   1. Cough Due to sinusitis and CAP. - DG Chest 2 View; Future - albuterol (PROVENTIL) (2.5 MG/3ML) 0.083% nebulizer solution 2.5 mg - ipratropium  (ATROVENT) nebulizer solution 0.5 mg - chlorpheniramine-HYDROcodone (TUSSIONEX PENNKINETIC ER) 10-8 MG/5ML SUER; Take 5 mLs by mouth every 12 (twelve) hours as needed for cough.  Dispense: 100 mL; Refill: 0 - albuterol (PROVENTIL) (2.5 MG/3ML) 0.083% nebulizer solution; Take 3 mLs (2.5 mg total) by nebulization every 6 (six) hours as needed for wheezing or shortness of breath.  Dispense: 75 mL; Refill:  12  2. Acute non-recurrent maxillary sinusitis - levofloxacin (LEVAQUIN) 750 MG tablet; Take 1 tablet (750 mg total) by mouth daily for 10 days.  Dispense: 10 tablet; Refill: 0  3. Lung mass - CT Chest W Contrast; Future  4. Community acquired pneumonia, unspecified laterality Levofloxacin as above.    Return in about 1 week (around 04/29/2017) for re-evaluation of cough/pneumonia.   Fernande Bras, PA-C Primary Care at [facility]

## 2017-04-26 ENCOUNTER — Other Ambulatory Visit (HOSPITAL_COMMUNITY): Payer: Self-pay | Admitting: *Deleted

## 2017-04-26 MED ORDER — SACUBITRIL-VALSARTAN 49-51 MG PO TABS: 1.0000 | ORAL_TABLET | Freq: Two times a day (BID) | ORAL | 3 refills | Status: DC

## 2017-04-27 ENCOUNTER — Encounter (HOSPITAL_COMMUNITY): Payer: 59 | Admitting: Cardiology

## 2017-05-02 ENCOUNTER — Other Ambulatory Visit: Payer: Self-pay

## 2017-05-02 ENCOUNTER — Encounter: Payer: Self-pay | Admitting: Physician Assistant

## 2017-05-02 ENCOUNTER — Ambulatory Visit: Payer: 59 | Admitting: Physician Assistant

## 2017-05-02 ENCOUNTER — Ambulatory Visit (INDEPENDENT_AMBULATORY_CARE_PROVIDER_SITE_OTHER): Payer: 59 | Admitting: Physician Assistant

## 2017-05-02 VITALS — BP 104/60 | HR 82 | Temp 98.5°F | Resp 18 | Ht 60.0 in | Wt 154.8 lb

## 2017-05-02 DIAGNOSIS — R05 Cough: Secondary | ICD-10-CM

## 2017-05-02 DIAGNOSIS — R059 Cough, unspecified: Secondary | ICD-10-CM

## 2017-05-02 DIAGNOSIS — J189 Pneumonia, unspecified organism: Secondary | ICD-10-CM | POA: Diagnosis not present

## 2017-05-02 DIAGNOSIS — R918 Other nonspecific abnormal finding of lung field: Secondary | ICD-10-CM

## 2017-05-02 MED ORDER — BECLOMETHASONE DIPROPIONATE 80 MCG/ACT IN AERS: 2.0000 | INHALATION_SPRAY | Freq: Two times a day (BID) | RESPIRATORY_TRACT | 12 refills | Status: DC

## 2017-05-02 MED ORDER — BENZONATATE 100 MG PO CAPS: 100.0000 mg | ORAL_CAPSULE | Freq: Three times a day (TID) | ORAL | 0 refills | Status: DC | PRN

## 2017-05-02 NOTE — Patient Instructions (Addendum)
Please call [facility] to schedule the chest CT. [phone].  CONTINUE the nebulizer treatments. CONTINUE the Tessalon perles (benzonatate). ADD the Qvar (steroid inhaler).  IF you received an x-ray today, you will receive an invoice from [facility]. Please contact [facility] at [phone] with questions or concerns regarding your invoice.   IF you received labwork today, you will receive an invoice from [facility]. Please contact [facility] at [phone] with questions or concerns regarding your invoice.   Our billing staff will not be able to assist you with questions regarding bills from these companies.  You will be contacted with the lab results as soon as they are available. The fastest way to get your results is to activate your My Chart account. Instructions are located on the last page of this paperwork. If you have not heard from [place] regarding the results in 2 weeks, please contact this office.

## 2017-05-02 NOTE — Progress Notes (Signed)
Patient ID: Stacy Mcintosh, female    DOB: [birth year], 62 y.o.   MRN: [id number]  PCP: Porfirio Oar, PA-C  Chief Complaint  Patient presents with  . Pneumonia    Pt states she is tired and still coughing. Pt states at times the nebulizer helps.  . Follow-up    Subjective:   Presents for evaluation of persistent cough.   I saw her for cough initially on 04/14/2017. She had been seen 03/23/2017 elsewhere, and prescribed prednisone, azithromycin and albuterol inhaler. She continued to cough, and pacemaker re-placement was put on hold until the cough was resolved. I suspected post-nasal drainage as the cause for her persistent cough and prescribed azelastine, tessalon perles and omeprazole (for possible LPR).  At follow-up on 04/22/2017, she was no better, and had developed dizziness and chest tightness. 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 She was prescribed levofloxacin and CT ordered.  Has not yet scheduled the CT scan. Didn't understand how it was different from the imaging that cardiology has ordered (which is actually on hold until we complete the evaluation of her cough).  Completed Levaquin. Tolerated it well-had a little constipation. Tessalon perles worked well at 200 mg, but she has run out. Cough syrup helped initially, but then seemed less effective for subsequent doses. Home nebs help and she is able to produce more sputum following a treatment. Hasn't been able to return to work. Hopes to return the day after tomorrow, to see how she does. Talking triggers coughing jags, sometime to gagging and emesis.  Tired. Sometimes feels lightheaded.  Review of Systems As above.    Patient Active Problem List   Diagnosis Date Noted  . Complete heart block (HCC) 02/16/2016  . Cough 10/01/2015  . Chronic systolic CHF (congestive heart failure) (HCC) 09/18/2015  . Postmenopausal  08/11/2015  . History of Lyme disease 08/11/2015  . Nonischemic cardiomyopathy (HCC) 03/04/2014  . Depression 02/27/2014  . 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 History of pulmonary embolism 01/25/2011  . Hypothyroidism 10/28/2010  . OTHER AND UNSPECIFIED HYPERLIPIDEMIA 01/19/2010  . PURE HYPERCHOLESTEROLEMIA 11/18/2008  . CARDIOMYOPATHY 11/12/2008  . LBBB (left bundle branch block) 11/12/2008  . Essential hypertension 10/16/2007  . HEADACHE, CHRONIC 10/16/2007     Prior to Admission medications   Medication Sig Start Date End Date Taking? Authorizing Provider  albuterol (PROVENTIL) (2.5 MG/3ML) 0.083% nebulizer solution Take 3 mLs (2.5 mg total) by nebulization every 6 (six) hours as needed for wheezing or shortness of breath. 04/22/17  Yes Dinia Joynt, PA-C  ALPRAZolam (XANAX) 0.5 MG tablet Take 1 tablet (0.5 mg total) by mouth 3 (three) times daily as needed for anxiety. 12/18/15  Yes Laurey Morale, MD  aspirin EC 81 MG tablet Take 81 mg by mouth daily at 12 noon.   Yes [provider]  azelastine (ASTELIN) 0.1 % nasal spray Place 2 sprays into both nostrils 2 (two) times daily. Use in each nostril as directed 04/14/17  Yes Adlee Paar, PA-C  benzonatate (TESSALON) 100 MG capsule Take 1-2 capsules (100-200 mg total) by mouth 3 (three) times daily as needed for cough. 04/14/17  Yes Julyssa Kyer, PA-C  carvedilol (COREG) 12.5 MG tablet Take 12.5 mg 2 (two) times daily with a meal by mouth.   Yes [provider]  clonazePAM (KLONOPIN) 1 MG tablet Take 1 mg by mouth at bedtime as needed for anxiety.  10/12/10  Yes [provider]  cyclobenzaprine (FLEXERIL) 10 MG tablet Take 10 mg by mouth as needed (take as directed).   Yes [provider]  doxepin (SINEQUAN) 10 MG capsule Take 10 mg by mouth at bedtime as needed (Take as directed).   Yes [provider]  furosemide (LASIX) 40 MG tablet Take 1 tablet (40 mg total) by mouth  daily. May take an additional 40mg  daily for fluid. 01/19/17  Yes Laurey Morale, MD  lamoTRIgine (LAMICTAL) 25 MG tablet Take 25 mg by mouth daily.   Yes [provider]  omeprazole (PRILOSEC) 40 MG capsule Take 1 capsule (40 mg total) by mouth daily. 04/14/17  Yes Racine Erby, PA-C  sacubitril-valsartan (ENTRESTO) 49-51 MG Take 1 tablet by mouth 2 (two) times daily. 04/26/17  Yes Laurey Morale, MD  simvastatin (ZOCOR) 20 MG tablet Take 1 tablet (20 mg total) by mouth at bedtime. Office visit needed for additional refills 11/08/16  Yes Alton Bouknight, PA-C  spironolactone (ALDACTONE) 25 MG tablet TAKE 1 TABLET BY MOUTH EVERY DAY 04/05/17  Yes Laurey Morale, MD  thyroid (ARMOUR THYROID) 60 MG tablet TAKE 1 TABLET (60 MG TOTAL) BY MOUTH DAILY BEFORE BREAKFAST. 04/14/17  Yes Chanel Mckesson, PA-C  Vitamin D, Ergocalciferol, (DRISDOL) 50000 units CAPS capsule Take 1 capsule (50,000 Units total) by mouth every 7 (seven) days. 08/13/15  Yes Porfirio Oar, PA-C     Allergies  Allergen Reactions  . Augmentin [Amoxicillin-Pot Clavulanate] Diarrhea    With yeast infection  Has patient had a PCN reaction causing immediate rash, facial/tongue/throat swelling, SOB or lightheadedness with hypotension:Yes Has patient had a PCN reaction causing severe rash involving mucus membranes or skin necrosis:unsure Has patient had a PCN reaction that required hospitalization:No Has patient had a PCN reaction occurring within the last 10 years:Yes If all of the above answers are "NO", then may proceed with Cephalosporin use.  Marland Kitchen Lisinopril Cough       Objective:  Physical Exam  Constitutional: She is oriented to person, place, and time. She appears well-developed and well-nourished. She is active and cooperative. No distress.  BP 104/60 (BP Location: Left Arm, Patient Position: Sitting, Cuff Size: Normal)   Pulse 82   Temp 98.5 F (36.9 C) (Oral)   Resp 18   Ht 5' (1.524 m)   Wt 154 lb 12.8  oz (70.2 kg)   SpO2 98%   BMI 30.23 kg/m   HENT:  Head: Normocephalic and atraumatic.  Right Ear: Hearing normal.  Left Ear: Hearing normal.  Eyes: Conjunctivae are normal. No scleral icterus.  Neck: Normal range of motion. Neck supple. No thyromegaly present.  Cardiovascular: Normal rate, regular rhythm and normal heart sounds.  Pulses:      Radial pulses are 2+ on the right side, and 2+ on the left side.  Pulmonary/Chest: Effort normal. She has wheezes (occasional, scattered, high-pitched, resolve with deep breathing).  Lymphadenopathy:       Head (right side): No tonsillar, no preauricular, no posterior auricular and no occipital adenopathy present.       Head (left side): No tonsillar, no preauricular, no posterior auricular and no occipital adenopathy present.    She has no cervical adenopathy.       Right: No supraclavicular adenopathy present.       Left: No supraclavicular adenopathy present.  Neurological: She is alert and oriented to person, place, and time. No sensory deficit.  Skin: Skin is warm, dry and intact. No rash noted. No cyanosis or  erythema. Nails show no clubbing.  Psychiatric: She has a normal mood and affect. Her speech is normal and behavior is normal.           Assessment & Plan:   Problem List Items Addressed This Visit    Cough - Primary    Persists. Resume Tesssalon perles. Add inhaled steroid. Proceed with chest CT to evaluate paratracheal mass.      Relevant Medications   benzonatate (TESSALON) 100 MG capsule   beclomethasone (QVAR) 80 MCG/ACT inhaler    Other Visit Diagnoses    Community acquired pneumonia, unspecified laterality       COmpleted levofloxacin.   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 Proceed with CT scan.       Return in about 2 months (around 06/30/2017) for re-evaluation of thyroid,  vitamin D, cholesterol.   Fernande Bras, PA-C Primary Care at [facility]

## 2017-05-03 ENCOUNTER — Ambulatory Visit: Payer: 59 | Admitting: Physician Assistant

## 2017-05-03 ENCOUNTER — Telehealth: Payer: Self-pay | Admitting: Physician Assistant

## 2017-05-03 NOTE — Telephone Encounter (Signed)
Copied from CRM [id number]. Topic: General - Other >> May 03, 2017  3:32 PM Lelon Frohlich, RMA wrote: Reason for CRM: Pt called and stated that the inhaler that was sent in for her qvar was too expensive and she would like an alternative sent to the pharmacy  Pharmacy is [facility] in [employer] on [address]

## 2017-05-04 NOTE — Telephone Encounter (Signed)
Please find out which inhaled steroid is preferred (examples: Flovent, Maxair, Asthmanex, Pulmicort).

## 2017-05-05 NOTE — Telephone Encounter (Signed)
Advised pt that Qvar was cheapest option at this time. Pt understood and will continue with albuterol and neb treatments.

## 2017-05-09 ENCOUNTER — Ambulatory Visit
Admission: RE | Admit: 2017-05-09 | Discharge: 2017-05-09 | Disposition: A | Discharge status: Home / Self Care | Payer: 59 | Source: Ambulatory Visit | Attending: Physician Assistant | Admitting: Physician Assistant

## 2017-05-09 DIAGNOSIS — R918 Other nonspecific abnormal finding of lung field: Secondary | ICD-10-CM

## 2017-05-09 MED ORDER — IOPAMIDOL (ISOVUE-300) INJECTION 61%
75.0000 mL | Freq: Once | INTRAVENOUS | Status: AC | PRN
  Administered 2017-05-09: 75 mL via INTRAVENOUS

## 2017-05-15 NOTE — Assessment & Plan Note (Signed)
Persists. Resume Tesssalon perles. Add inhaled steroid. Proceed with chest CT to evaluate paratracheal mass.

## 2017-05-16 ENCOUNTER — Telehealth: Payer: Self-pay

## 2017-05-16 NOTE — Telephone Encounter (Signed)
Provider, please advise.  

## 2017-05-16 NOTE — Telephone Encounter (Signed)
Copied from CRM [id number]. Topic: General - Other >> May 15, 2017  4:23 PM Landry Mellow wrote: Reason for CRM: pt would like to have call when ct results are back a nd read. Please call [phone]

## 2017-05-16 NOTE — Telephone Encounter (Signed)
No cause for cough seen on CT scan, and no mass related to paratracheal area noted on the previous imaging.  Called to speak with radiologist, but none of them were available and staff recommended I try again tomorrow.

## 2017-05-17 NOTE — Telephone Encounter (Signed)
Spoke with radiologist, Dr. Kennith Maes. He re-examined the initial CXR and CT scan. No mass on CT. Suspect apparent paratracheal mass was due to lack of deep inspiration, rather than actual mass.

## 2017-05-18 ENCOUNTER — Other Ambulatory Visit (HOSPITAL_COMMUNITY): Payer: Self-pay | Admitting: Cardiology

## 2017-05-25 ENCOUNTER — Telehealth: Payer: Self-pay | Admitting: Cardiology

## 2017-05-25 NOTE — Telephone Encounter (Signed)
Coughing since Thanksgiving. She would like to know if Shirlee Latch feels she is safe to proceed with cath (she is coughing several times per minute during phone conversation). Pt understands I will forward to [facility]'s nurse to call her and address. (she was also going to have PPM upgraded w/ Dr. Elberta Fortis, but not sure she can do this now d/t financial constraints) Pt would really like to know what is absolutely necessary b/c she can't afford a lot more that what has already "mounted up".  Since Thanksgiving she has been dx w/ bronchitis, sinusitis, and PNA.  She also reports that she was told today she had scar tissue in her lungs and not sure what that means or what caused it.  She was told that the "scar tissue is not what is causing her cough". She will follow up w/ primary doctor Tuesday to discuss findings more.  She is concerned about upcoming medical needs/procedures.  Her medical insurance has changed.  Pt needs to know the cost of procedure before agreeing to proceed b/c she is already drowning in medical bills and not sure she can afford anymore.  States that she has reached out to hospital billing and they have told her they cannot tell her cost until after the procedure.  Informed patient I would speak with hospital billing and call her by next week with findings (if any).  Pt understands Dr. Alford Highland nurse will call her to discuss cath and that I will speak with hospital about cost of procedures and call her by next week. She is very appreciative of my call and help with these matters.

## 2017-05-25 NOTE — Telephone Encounter (Signed)
Spoke with pt. She states she is not set up with MyChart.  Gave her Help desk # for assistance in getting into MyCHart  Gave results as per Chelle's message on CT results.  Pt coughing during conversation with some hoarseness.  Made appt for Tues 2/5 for pt.

## 2017-05-25 NOTE — Telephone Encounter (Signed)
Called pt. With no answer. Release not complete. CRM placed when pt returns call.   No cause for cough seen on CT scan, and no mass related to paratracheal area noted on the previous imaging.  Spoke with radiologist, Dr. Kennith Maes. He re-examined the initial CXR and CT scan. No mass on CT. Suspect apparent paratracheal mass was due to lack of deep inspiration, rather than actual mass.    Copied from CRM [id number]. Topic: General - Other >> May 25, 2017 11:50 AM Lelon Frohlich, RMA wrote: Reason for CRM: pt called and requested her CT results and no one has gotten back to her she would like a call @ [phone]

## 2017-05-25 NOTE — Telephone Encounter (Signed)
Copied from CRM [id number]. Topic: General - Other >> May 25, 2017 11:50 AM Lelon Frohlich, RMA wrote: Reason for CRM: pt called and requested her CT results and no one has gotten back to her she would like a call @ [phone]

## 2017-05-25 NOTE — Telephone Encounter (Signed)
New message  Pt verbalized that she is calling for the RN  Pt has scarring on her lungs and she was seen at [facility]  She seen C.Jefferies PA She has been coughing since thanks giving

## 2017-05-26 NOTE — Telephone Encounter (Signed)
Thanks for letting me know!

## 2017-05-26 NOTE — Telephone Encounter (Signed)
Pt cancelled cath back in Nov, she is sch to f/u with Dr Shirlee Latch on 06/02/17, spoke w/pt and advised her to keep that appt and we can discuss cath at that time, she is agreeable.  Will send to Dr Alford Highland nurse Katrina so she is aware

## 2017-05-29 ENCOUNTER — Telehealth: Payer: Self-pay | Admitting: Cardiology

## 2017-05-29 ENCOUNTER — Encounter: Payer: 59 | Admitting: *Deleted

## 2017-05-29 NOTE — Telephone Encounter (Signed)
Called pt to confirm remote appt. She stated that she is looking into how much the transmission is going to cost b/c in the past she has paid $1,200 for each visit. She stated that her savings is almost gone and she can not keep paying for the remote transmission. She is going to talk to Dr. Shirlee Latch tomorrow (Tuesday 05-30-17) and would like to speak with Dr. Elberta Fortis about this issue as well.

## 2017-05-29 NOTE — Telephone Encounter (Signed)
Spoke with pt and reminded pt of remote transmission that is due today. Pt verbalized understanding.   

## 2017-05-30 ENCOUNTER — Ambulatory Visit (INDEPENDENT_AMBULATORY_CARE_PROVIDER_SITE_OTHER): Payer: 59 | Admitting: Physician Assistant

## 2017-05-30 ENCOUNTER — Encounter: Payer: Self-pay | Admitting: Physician Assistant

## 2017-05-30 ENCOUNTER — Other Ambulatory Visit: Payer: Self-pay

## 2017-05-30 VITALS — BP 126/68 | HR 82 | Temp 99.2°F | Resp 18 | Ht 60.0 in | Wt 156.6 lb

## 2017-05-30 DIAGNOSIS — R05 Cough: Secondary | ICD-10-CM | POA: Diagnosis not present

## 2017-05-30 DIAGNOSIS — I428 Other cardiomyopathies: Secondary | ICD-10-CM | POA: Diagnosis not present

## 2017-05-30 DIAGNOSIS — I442 Atrioventricular block, complete: Secondary | ICD-10-CM | POA: Diagnosis not present

## 2017-05-30 DIAGNOSIS — R059 Cough, unspecified: Secondary | ICD-10-CM

## 2017-05-30 MED ORDER — BENZONATATE 100 MG PO CAPS: 100.0000 mg | ORAL_CAPSULE | Freq: Three times a day (TID) | ORAL | 0 refills | Status: DC | PRN

## 2017-05-30 MED ORDER — PANTOPRAZOLE SODIUM 40 MG PO TBEC: 40.0000 mg | DELAYED_RELEASE_TABLET | Freq: Every day | ORAL | 3 refills | Status: AC

## 2017-05-30 MED ORDER — TRAMADOL HCL 50 MG PO TABS: 50.0000 mg | ORAL_TABLET | Freq: Four times a day (QID) | ORAL | 0 refills | Status: DC | PRN

## 2017-05-30 NOTE — Assessment & Plan Note (Signed)
Proceed with cardiology visit 2/08 as planned.

## 2017-05-30 NOTE — Progress Notes (Signed)
Subjective:    Patient ID: Stacy Mcintosh, female    DOB: 1955-08-21, 62 y.o.   MRN: [id number]  Chief Complaint  Patient presents with  . Cough    Pt states cough is getting better the last couple of days. Pt states she has been doing neti pot a few times a day.  . Follow-up  . Results    Pt would like to discuss CT results.  . Depression    Depression scale score 15   Patient presents for re-evaluation of cough.   Initially seen elsewhere on 03/23/17. Treated for bronchitis with bronchospasm with prednisone, azithromycin, and albuterol inhaler. She was seen at this office on 04/14/17. Suspected post-nasal drainage causing the persistent cough or LPR. Treated with azelastine, tessalon perles, and omeprazole. Follow-up on 04/22/17, presented with dizziness and chest tightness. 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 Prescribed Levofloxacin, Proventil nebulizer, Atrovent nebulizer, and Tussionex and CT was ordered.  Returned on 05/02/17. She had not scheduled CT scan. There was confusion on how the CT scan was different from imaging cardiology had ordered. (myocardial perfusion test on hold until cough resolves). She was advised to complete the CT scan. Continue Tessalon and add Qvar. Qvar was too expensive at the pharmacy so she never picked up the medication.   CT scan 05/09/17 results: Linear and reticular opacities evident at the lung bases,consistent with subsegmental atelectasis. No convincing pneumonia. No pulmonary edema. No lung masses or suspicious nodules.  At today's visit she would like to discuss results of CT scan. She is confused about the results. Continues to have a persistent cough which is worse at night and after eating.  Talking, laughing, and cold water trigger coughing.  Uses Neti pot 3x/day, which helps. Herbal teas, OTC cough drops, and Tessalon. Tessalon perles help, but she finds herself  needing to take more than prescribed. Uses albuterol inhaler and nebulizer when things are "really bad." Thinks nebulizer worsens her symptoms sometimes.   Overall, she feels better since cough began in late November 2018. She feels "down" and "tired." She sees Dr. Evelene Croon for her mood. She is currently weaning off Lamotrigine. Feels it did nothing to stabilize her mood. Still taking Xanax and Klonopin for sleep. She likes Dr. Evelene Croon because she "doesn't push drugs" on her. Was seeing therapist, Dub Amis, for EMDR therapy, which was helpful. Stopped going in Oct/Nov due to getting sick and insurance/money instability.   Says she is "having all the symptoms of heart failure." She is using two pillows at night, but not more than usual. Denies PND or swelling lower extremities. With a recent change in insurance and depletion of savings, she is having to look into how much things will cost more than usual. She in unsure she needs the extra lead placement is necessary. She was told she needs one, but does not understand why. She has a follow-up with Dr. Shirlee Latch on 06/02/17 and Dr. Elberta Fortis on 06/19/17.   Reports a diagnosis of Lyme disease in 2016. Was given the treatment for Lyme, which she did not tolerate. Some providers tell her she has Lyme, and others tell her she does not. She gets frustrated by the conflicting reports. Some days she really feels the Lyme flaring and she gets confused. She used to enjoy hiking and camping, but has been too tired recently to do those activities.   Previously seen by Pulmonology in 2017 and did not have a good  experience. Was prescribed Symbicort, which she says "almost killed me."  She prefers more natural treatments. She is very sensitive to medications.  Depression screen [facility] 2/9 05/30/2017 05/02/2017 04/22/2017  Decreased Interest 3 3 3   Down, Depressed, Hopeless 3 3 3   PHQ - 2 Score 6 6 6   Altered sleeping 2 - 2  Tired, decreased energy 1 - 3  Change in appetite 1 - 2    Feeling bad or failure about yourself  2 - 3  Trouble concentrating 2 - 3  Moving slowly or fidgety/restless 0 - 2  Suicidal thoughts 1 - 0  PHQ-9 Score 15 - 21  Difficult doing work/chores Somewhat difficult - -   Review of Systems As above.  Patient Active Problem List   Diagnosis Date Noted  . Complete heart block (HCC) 02/16/2016  . Cough 10/01/2015  . Chronic systolic CHF (congestive heart failure) (HCC) 09/18/2015  . Postmenopausal 08/11/2015  . History of Lyme disease 08/11/2015  . Nonischemic cardiomyopathy (HCC) 03/04/2014  . Depression 02/27/2014  . 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 History of pulmonary embolism 01/25/2011  . Hypothyroidism 10/28/2010  . OTHER AND UNSPECIFIED HYPERLIPIDEMIA 01/19/2010  . PURE HYPERCHOLESTEROLEMIA 11/18/2008  . CARDIOMYOPATHY 11/12/2008  . LBBB (left bundle branch block) 11/12/2008  . Essential hypertension 10/16/2007  . HEADACHE, CHRONIC 10/16/2007   Prior to Admission medications   Medication Sig Start Date End Date Taking? Authorizing Provider  albuterol (PROVENTIL) (2.5 MG/3ML) 0.083% nebulizer solution Take 3 mLs (2.5 mg total) by nebulization every 6 (six) hours as needed for wheezing or shortness of breath. 04/22/17  Yes Jeffery, Chelle, PA-C  ALPRAZolam (XANAX) 0.5 MG tablet Take 1 tablet (0.5 mg total) by mouth 3 (three) times daily as needed for anxiety. 12/18/15  Yes Laurey Morale, MD  aspirin EC 81 MG tablet Take 81 mg by mouth daily at 12 noon.   Yes [provider]  azelastine (ASTELIN) 0.1 % nasal spray Place 2 sprays into both nostrils 2 (two) times daily. Use in each nostril as directed 04/14/17  Yes Jeffery, Chelle, PA-C  beclomethasone (QVAR) 80 MCG/ACT inhaler Inhale 2 puffs into the lungs 2 (two) times daily. 05/02/17  Yes Jeffery, Chelle, PA-C  benzonatate (TESSALON) 100 MG capsule Take 1-2 capsules (100-200 mg total) by mouth 3 (three) times daily as needed for cough. 05/30/17  Yes Jeffery, Chelle,  PA-C  carvedilol (COREG) 12.5 MG tablet Take 12.5 mg 2 (two) times daily with a meal by mouth.   Yes [provider]  clonazePAM (KLONOPIN) 1 MG tablet Take 1 mg by mouth at bedtime as needed for anxiety.  10/12/10  Yes [provider]  cyclobenzaprine (FLEXERIL) 10 MG tablet Take 10 mg by mouth as needed (take as directed).   Yes [provider]  doxepin (SINEQUAN) 10 MG capsule Take 10 mg by mouth at bedtime as needed (Take as directed).   Yes [provider]  furosemide (LASIX) 40 MG tablet TAKE 1 TABLET (40 MG TOTAL) BY MOUTH DAILY. MAY TAKE AN ADDITIONAL 40MG  DAILY FOR FLUID. 05/18/17  Yes Laurey Morale, MD  lamoTRIgine (LAMICTAL) 25 MG tablet Take 25 mg by mouth daily.   Yes [provider]  sacubitril-valsartan (ENTRESTO) 49-51 MG Take 1 tablet by mouth 2 (two) times daily. 04/26/17  Yes Laurey Morale, MD  simvastatin (ZOCOR) 20 MG tablet Take 1 tablet (20 mg total) by mouth at bedtime. Office visit needed for additional refills 11/08/16  Yes Porfirio Oar, PA-C  spironolactone (ALDACTONE) 25 MG tablet TAKE 1 TABLET BY MOUTH EVERY DAY 04/05/17  Yes Laurey Morale, MD  thyroid (ARMOUR THYROID) 60 MG tablet TAKE 1 TABLET (60 MG TOTAL) BY MOUTH DAILY BEFORE BREAKFAST. 04/14/17  Yes Jeffery, Chelle, PA-C  Vitamin D, Ergocalciferol, (DRISDOL) 50000 units CAPS capsule Take 1 capsule (50,000 Units total) by mouth every 7 (seven) days. 08/13/15  Yes Porfirio Oar, PA-C                 Allergies  Allergen Reactions  . Augmentin [Amoxicillin-Pot Clavulanate] Diarrhea    With yeast infection  Has patient had a PCN reaction causing immediate rash, facial/tongue/throat swelling, SOB or lightheadedness with hypotension:Yes Has patient had a PCN reaction causing severe rash involving mucus membranes or skin necrosis:unsure Has patient had a PCN reaction that required hospitalization:No Has patient had a PCN reaction occurring within the last 10  years:Yes If all of the above answers are "NO", then may proceed with Cephalosporin use.  Marland Kitchen Lisinopril Cough       Objective:   Physical Exam  Constitutional: She is oriented to person, place, and time. She appears well-developed and well-nourished.  HENT:  Head: Normocephalic.  Neck: No JVD present. No thyromegaly present.  Cardiovascular: Normal rate, regular rhythm, normal heart sounds and intact distal pulses. Exam reveals no gallop and no friction rub.  No murmur heard. Pulses:      Radial pulses are 2+ on the right side, and 2+ on the left side.       Posterior tibial pulses are 2+ on the right side, and 2+ on the left side.  Pulmonary/Chest: She has wheezes (occassional, high pitched). She has no rhonchi. She has no rales.  Musculoskeletal: She exhibits no edema.  Lymphadenopathy:       Head (right side): No submental, no submandibular, no tonsillar, no preauricular, no posterior auricular and no occipital adenopathy present.       Head (left side): No submental, no submandibular, no tonsillar, no preauricular, no posterior auricular and no occipital adenopathy present.    She has no cervical adenopathy.  Neurological: She is alert and oriented to person, place, and time.  Skin: Skin is warm and dry.  Psychiatric: She has a normal mood and affect. Her behavior is normal.   Blood pressure 126/68, pulse 82, temperature 99.2 F (37.3 C), temperature source Oral, resp. rate 18, height 5' (1.524 m), weight 156 lb 9.6 oz (71 kg), SpO2 98 %.     Assessment & Plan:  1. Cough Unclear etiology. CT scan 05/09/17 results: Linear and reticular opacities evident at the lung bases,consistent with subsegmental atelectasis.No convincing pneumonia. No pulmonary edema. No lung masses or suspicious nodules. Triggers: laughing, talking, cold water. Worsens after eating and at night. Suspect LPR or upper airway cough syndrome. Stop Omeprazole. Start Pantoprazole. Start Tramadol. Continue  Benzonatate. Instructed to not take more Benzonatate than prescribed. Referral to pulmonology.   - Ambulatory referral to Pulmonology - pantoprazole (PROTONIX) 40 MG tablet; Take 1 tablet (40 mg total) by mouth daily.  Dispense: 90 tablet; Refill: 3 - traMADol (ULTRAM) 50 MG tablet; Take 1-2 tablets (50-100 mg total) by mouth every 6 (six) hours as needed (cough).  Dispense: 30 tablet; Refill: 0 - benzonatate (TESSALON) 100 MG capsule; Take 1-2 capsules (100-200 mg total) by mouth 3 (three) times daily as needed for cough.  Dispense: 120 capsule; Refill: 0  2. Complete heart block (HCC) 3. Nonischemic cardiomyopathy [facility]) Follow-up with cardiology on 06/02/17  as planned.   Return if symptoms worsen or fail to improve, and after discussion with cardiology and evalaution with pulm.  Alfonse Alpers, PA-S

## 2017-05-30 NOTE — Progress Notes (Signed)
Patient ID: Bobbye Petti, female    DOB: [birth year], 62 y.o.   MRN: [id number]  PCP: Porfirio Oar, PA-C  Chief Complaint  Patient presents with  . Cough    Pt states cough is getting better the last couple of days. Pt states she has been doing neti pot a few times a day.  . Follow-up  . Results    Pt would like to discuss CT results.  . Depression    Depression scale score 15    Subjective:   Presents for evaluation of persistent cough.  Recall that pacemaker wire placement has been delayed due to this cough (I previously misunderstood the needed procedure as pacemaker replacement).  Recent history copied from my note 05/02/2017: I saw her for cough initially on 04/14/2017. She had been seen 03/23/2017 elsewhere, and prescribed prednisone, azithromycin and albuterol inhaler. She continued to cough, and pacemaker re-placement was put on hold until the cough was resolved. I suspected post-nasal drainage as the cause for her persistent cough and prescribed azelastine, tessalon perles and omeprazole (for possible LPR).  At follow-up on 04/22/2017, she was no better, and had developed dizziness and chest tightness. 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 She was prescribed levofloxacin and CT ordered.  Completed Levaquin. Tessalon perles worked well at 200 mg. Cough syrup helped initially, but then seemed less effective for subsequent doses. Home nebs help and she is able to produce more sputum following a treatment. Hasn't been able to return to work. Hopes to return the day after tomorrow, to see how she does. Talking triggers coughing jags, sometime to gagging and emesis. Tired. Sometimes feels lightheaded.  CT scan revealed no paratracheal mass. I reviewed it with radiology and they think that the possible mass effect was actually due to poor inspiration on the CXR. No pneumonia noted. Bibasilar  atelectasis noted. Has not tolerated oral steroids. Hasn't been able to afford the inhaled steroid prescribed, and reports that she just doesn't tolerate inhalers in general. Tolerates albuterol rescue and albuterol vis nebulizer, but doesn't think it helps at all. Hydrocodone-containing cough preparations without benefit. Tessalon perles are helpful, but only when she uses higher than prescribed doses.  In general, she prefers natural treatments for what ails her, and has some mistrust of western medicine. Understands that stress often causes real physical effects and wonders how much of her cough is due to her current stress, a lot of which is financial. Notes that she doesn't know why she needs the third wire for her pacemaker, and doesn't know what the consequences are of not getting it. She is seeing cardiology on 2/08 and plans to ask this question. "I mean, if I'm going to die with out it, then I'm just going to die." Finances are currently strained, and she's having to plan payments of her expenses.  Cough is worse at night and after meals. Feels like it's coming from her throat. Takes omeprazole, "I don't know why I have reflux now, I never did before." Has eliminated spicy and acidic foods from her diet.  "I have all the symptoms of heart failure." Sleeps on two pillows, but that is not new. No SOB when she isn't coughing. No LE edema. No CP. No chest tightness/heaviness.  She is working with her psychiatrist to taper off and stop lamotrigine, due to nausea/vomiting associated with it, and lack of mood stabilization effect.   Review of Systems  As above.    Depression screen [facility] 2/9 05/30/2017 05/02/2017 04/22/2017 04/14/2017 08/11/2015  Decreased Interest 3 3 3  0 0  Down, Depressed, Hopeless 3 3 3  0 3  PHQ - 2 Score 6 6 6  0 3  Altered sleeping 2 - 2 - 1  Tired, decreased energy 1 - 3 - 2  Change in appetite 1 - 2 - 2  Feeling bad or failure about yourself  2 - 3 - 1    Trouble concentrating 2 - 3 - 3  Moving slowly or fidgety/restless 0 - 2 - 2  Suicidal thoughts 1 - 0 - 1  PHQ-9 Score 15 - 21 - 15  Difficult doing work/chores Somewhat difficult - - - -     Patient Active Problem List   Diagnosis Date Noted  . Complete heart block (HCC) 02/16/2016  . Cough 10/01/2015  . Chronic systolic CHF (congestive heart failure) (HCC) 09/18/2015  . Postmenopausal 08/11/2015  . History of Lyme disease 08/11/2015  . Nonischemic cardiomyopathy (HCC) 03/04/2014  . Depression 02/27/2014  . 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 History of pulmonary embolism 01/25/2011  . Hypothyroidism 10/28/2010  . OTHER AND UNSPECIFIED HYPERLIPIDEMIA 01/19/2010  . PURE HYPERCHOLESTEROLEMIA 11/18/2008  . CARDIOMYOPATHY 11/12/2008  . LBBB (left bundle branch block) 11/12/2008  . Essential hypertension 10/16/2007  . HEADACHE, CHRONIC 10/16/2007     Prior to Admission medications   Medication Sig Start Date End Date Taking? Authorizing Provider  albuterol (PROVENTIL) (2.5 MG/3ML) 0.083% nebulizer solution Take 3 mLs (2.5 mg total) by nebulization every 6 (six) hours as needed for wheezing or shortness of breath. 04/22/17  Yes Joannah Gitlin, PA-C  ALPRAZolam (XANAX) 0.5 MG tablet Take 1 tablet (0.5 mg total) by mouth 3 (three) times daily as needed for anxiety. 12/18/15  Yes Laurey Morale, MD  aspirin EC 81 MG tablet Take 81 mg by mouth daily at 12 noon.   Yes [provider]  azelastine (ASTELIN) 0.1 % nasal spray Place 2 sprays into both nostrils 2 (two) times daily. Use in each nostril as directed 04/14/17  Yes Cassandre Oleksy, PA-C  beclomethasone (QVAR) 80 MCG/ACT inhaler Inhale 2 puffs into the lungs 2 (two) times daily. 05/02/17  Yes Aysha Livecchi, PA-C  benzonatate (TESSALON) 100 MG capsule Take 1-2 capsules (100-200 mg total) by mouth 3 (three) times daily as needed for cough. 05/02/17  Yes Nichael Ehly, PA-C  carvedilol (COREG) 12.5 MG tablet Take  12.5 mg 2 (two) times daily with a meal by mouth.   Yes [provider]  clonazePAM (KLONOPIN) 1 MG tablet Take 1 mg by mouth at bedtime as needed for anxiety.  10/12/10  Yes [provider]  cyclobenzaprine (FLEXERIL) 10 MG tablet Take 10 mg by mouth as needed (take as directed).   Yes [provider]  doxepin (SINEQUAN) 10 MG capsule Take 10 mg by mouth at bedtime as needed (Take as directed).   Yes [provider]  furosemide (LASIX) 40 MG tablet TAKE 1 TABLET (40 MG TOTAL) BY MOUTH DAILY. MAY TAKE AN ADDITIONAL 40MG  DAILY FOR FLUID. 05/18/17  Yes Laurey Morale, MD  lamoTRIgine (LAMICTAL) 25 MG tablet Take 25 mg by mouth daily.   Yes [provider]  omeprazole (PRILOSEC) 40 MG capsule Take 1 capsule (40 mg total) by mouth daily. 04/14/17  Yes Bibi Economos, PA-C  sacubitril-valsartan (ENTRESTO) 49-51 MG Take 1 tablet by mouth 2 (two) times daily. 04/26/17  Yes Laurey Morale, MD  simvastatin (ZOCOR) 20 MG tablet Take 1 tablet (20 mg total) by mouth at bedtime. Office visit needed for additional refills 11/08/16  Yes Chandon Lazcano, PA-C  spironolactone (ALDACTONE) 25 MG tablet TAKE 1 TABLET BY MOUTH EVERY DAY 04/05/17  Yes Laurey Morale, MD  thyroid (ARMOUR THYROID) 60 MG tablet TAKE 1 TABLET (60 MG TOTAL) BY MOUTH DAILY BEFORE BREAKFAST. 04/14/17  Yes Reagen Goates, PA-C  Vitamin D, Ergocalciferol, (DRISDOL) 50000 units CAPS capsule Take 1 capsule (50,000 Units total) by mouth every 7 (seven) days. 08/13/15  Yes Porfirio Oar, PA-C     Allergies  Allergen Reactions  . Augmentin [Amoxicillin-Pot Clavulanate] Diarrhea    With yeast infection  Has patient had a PCN reaction causing immediate rash, facial/tongue/throat swelling, SOB or lightheadedness with hypotension:Yes Has patient had a PCN reaction causing severe rash involving mucus membranes or skin necrosis:unsure Has patient had a PCN reaction that required hospitalization:No Has  patient had a PCN reaction occurring within the last 10 years:Yes If all of the above answers are "NO", then may proceed with Cephalosporin use.  Marland Kitchen Lisinopril Cough       Objective:  Physical Exam  Constitutional: She is oriented to person, place, and time. She appears well-developed and well-nourished. She is active and cooperative. No distress.  BP 126/68 (BP Location: Left Arm, Patient Position: Sitting, Cuff Size: Normal)   Pulse 82   Temp 99.2 F (37.3 C) (Oral)   Resp 18   Ht 5' (1.524 m)   Wt 156 lb 9.6 oz (71 kg)   SpO2 98%   BMI 30.58 kg/m   HENT:  Head: Normocephalic and atraumatic.  Right Ear: Hearing normal.  Left Ear: Hearing normal.  Eyes: Conjunctivae are normal. No scleral icterus.  Neck: Normal range of motion. Neck supple. No thyromegaly present.  Cardiovascular: Normal rate, regular rhythm and normal heart sounds.  Pulses:      Radial pulses are 2+ on the right side, and 2+ on the left side.  Pulmonary/Chest: Effort normal and breath sounds normal.  Coughs intermittently during visit, especially after laughing, or with deep breathing for lung exam.  Lymphadenopathy:       Head (right side): No tonsillar, no preauricular, no posterior auricular and no occipital adenopathy present.       Head (left side): No tonsillar, no preauricular, no posterior auricular and no occipital adenopathy present.    She has no cervical adenopathy.       Right: No supraclavicular adenopathy present.       Left: No supraclavicular adenopathy present.  Neurological: She is alert and oriented to person, place, and time. No sensory deficit.  Skin: Skin is warm, dry and intact. No rash noted. No cyanosis or erythema. Nails show no clubbing.  Psychiatric: She has a normal mood and affect. Her speech is normal and behavior is normal.     Ct Chest W Contrast  Result Date: 05/09/2017 CLINICAL DATA:  Cough and wheezing x 2 months with antibiotics for pna and bronchitis, mass seen on  xray, non smoker, no hx of cancer, hx of pacemaker EXAM: CT CHEST WITH CONTRAST TECHNIQUE: Multidetector CT imaging of the chest was performed during intravenous contrast administration. Creatinine was obtained on site at [facility] at [address] Results: Creatinine 1.0 mg/dL.  GFR 60. CONTRAST:  75mL ISOVUE-300 IOPAMIDOL (ISOVUE-300) INJECTION 61% COMPARISON:  Chest radiograph, 05/19/2016.  Chest CT, 11/19/2007. FINDINGS: Cardiovascular: Heart is top-normal in size. Normal morphology. No significant coronary artery  calcifications. Great vessels are normal in caliber. No aortic dissection or significant atherosclerosis. Mediastinum/Nodes: No neck base or axillary masses or adenopathy. No mediastinal or hilar masses or adenopathy. Trachea is widely patent. Esophagus is unremarkable. Lungs/Pleura: Linear and reticular type opacities are noted in the lung bases, in the lower lobes, left upper lobe lingula and right middle lobe, is consistent with subsegmental atelectasis. Minor opacity noted in the peripheral right upper lobe laterally and right middle lobe laterally, likely scarring or additional atelectasis. No convincing pneumonia. No evidence of pulmonary edema. No mass or suspicious nodule. No pleural effusion or pneumothorax. Upper Abdomen: No acute abnormality. Musculoskeletal: No fracture or acute finding. No osteoblastic or osteolytic lesions. Mild disc degenerative changes along the thoracic spine. IMPRESSION: 1. Linear and reticular opacities evident at the lung bases, consistent with subsegmental atelectasis. 2. No convincing pneumonia. No pulmonary edema. No lung masses or suspicious nodules. Electronically Signed   By: Amie Portland M.D.   On: 05/09/2017 14:01      Assessment & Plan:   Problem List Items Addressed This Visit    Nonischemic cardiomyopathy (HCC)    Proceed with cardiology visit 2/08 to discuss next steps.      Cough - Primary    Suspect LPR. Try change from  omeprazole to pantoprazole. Intolerant to steroids and to inhalers. Treated with azithromycin and levofloxacin. Subsegmental atelectasis noted on CT scan (performed because of possible mass on the CXR, presumed due to sub-adequate inspiration). Refer back to pulmonology.      Relevant Medications   pantoprazole (PROTONIX) 40 MG tablet   traMADol (ULTRAM) 50 MG tablet   benzonatate (TESSALON) 100 MG capsule   Other Relevant Orders   Ambulatory referral to Pulmonology   Complete heart block (HCC)    Proceed with cardiology visit 2/08 as planned.          Return if symptoms worsen or fail to improve, and after discussion with cardiology and evalaution with pulm.   Fernande Bras, PA-C Primary Care at [facility]

## 2017-05-30 NOTE — Assessment & Plan Note (Addendum)
Suspect LPR. Try change from omeprazole to pantoprazole. Intolerant to steroids and to inhalers. Treated with azithromycin and levofloxacin. Subsegmental atelectasis noted on CT scan (performed because of possible mass on the CXR, presumed due to sub-adequate inspiration). Refer back to pulmonology.

## 2017-05-30 NOTE — Assessment & Plan Note (Signed)
Proceed with cardiology visit 2/08 to discuss next steps.

## 2017-05-30 NOTE — Patient Instructions (Signed)
     IF you received an x-ray today, you will receive an invoice from [facility]. Please contact [facility] at [phone] with questions or concerns regarding your invoice.   IF you received labwork today, you will receive an invoice from [facility]. Please contact [facility] at [phone] with questions or concerns regarding your invoice.   Our billing staff will not be able to assist you with questions regarding bills from these companies.  You will be contacted with the lab results as soon as they are available. The fastest way to get your results is to activate your My Chart account. Instructions are located on the last page of this paperwork. If you have not heard from us regarding the results in 2 weeks, please contact this office.     

## 2017-05-31 ENCOUNTER — Telehealth: Payer: Self-pay | Admitting: Physician Assistant

## 2017-05-31 NOTE — Telephone Encounter (Signed)
LMOVM for pt to return call 

## 2017-05-31 NOTE — Telephone Encounter (Signed)
Called pt to try and reschedule appt with Porfirio Oar on 07/07/17. Pt will call back and let us know when she wants to reschedule.  Thanks!

## 2017-05-31 NOTE — Telephone Encounter (Signed)
Dr. Elberta Fortis agreeable to moving remote checks to q39m. Please arrange/call and discuss w/ patient.

## 2017-06-01 NOTE — Telephone Encounter (Signed)
Patient will try to do remotes every 6 months but if it continues to be costly she will have to reevaluate.

## 2017-06-02 ENCOUNTER — Encounter (HOSPITAL_COMMUNITY): Payer: Self-pay | Admitting: Cardiology

## 2017-06-02 ENCOUNTER — Other Ambulatory Visit: Payer: Self-pay

## 2017-06-02 ENCOUNTER — Ambulatory Visit (HOSPITAL_COMMUNITY)
Admission: RE | Admit: 2017-06-02 | Discharge: 2017-06-02 | Disposition: A | Discharge status: Home / Self Care | Payer: 59 | Source: Ambulatory Visit | Attending: Cardiology | Admitting: Cardiology

## 2017-06-02 VITALS — BP 138/84 | HR 78 | Wt 158.2 lb

## 2017-06-02 DIAGNOSIS — I110 Hypertensive heart disease with heart failure: Secondary | ICD-10-CM | POA: Diagnosis not present

## 2017-06-02 DIAGNOSIS — I442 Atrioventricular block, complete: Secondary | ICD-10-CM

## 2017-06-02 DIAGNOSIS — I429 Cardiomyopathy, unspecified: Secondary | ICD-10-CM

## 2017-06-02 DIAGNOSIS — Z833 Family history of diabetes mellitus: Secondary | ICD-10-CM | POA: Diagnosis not present

## 2017-06-02 DIAGNOSIS — E7800 Pure hypercholesterolemia, unspecified: Secondary | ICD-10-CM | POA: Diagnosis not present

## 2017-06-02 DIAGNOSIS — E0500 Thyrotoxicosis with diffuse goiter without thyrotoxic crisis or storm: Secondary | ICD-10-CM | POA: Diagnosis not present

## 2017-06-02 DIAGNOSIS — F329 Major depressive disorder, single episode, unspecified: Secondary | ICD-10-CM | POA: Insufficient documentation

## 2017-06-02 DIAGNOSIS — I5022 Chronic systolic (congestive) heart failure: Secondary | ICD-10-CM | POA: Diagnosis not present

## 2017-06-02 DIAGNOSIS — E039 Hypothyroidism, unspecified: Secondary | ICD-10-CM | POA: Diagnosis not present

## 2017-06-02 DIAGNOSIS — Z0189 Encounter for other specified special examinations: Secondary | ICD-10-CM | POA: Diagnosis not present

## 2017-06-02 DIAGNOSIS — Z79899 Other long term (current) drug therapy: Secondary | ICD-10-CM | POA: Diagnosis not present

## 2017-06-02 DIAGNOSIS — Z7982 Long term (current) use of aspirin: Secondary | ICD-10-CM | POA: Insufficient documentation

## 2017-06-02 DIAGNOSIS — J45909 Unspecified asthma, uncomplicated: Secondary | ICD-10-CM | POA: Insufficient documentation

## 2017-06-02 DIAGNOSIS — Z950 Presence of cardiac pacemaker: Secondary | ICD-10-CM | POA: Insufficient documentation

## 2017-06-02 LAB — BASIC METABOLIC PANEL
Anion gap: 11 (ref 5–15)
BUN: 8 mg/dL (ref 6–20)
CHLORIDE: 100 mmol/L — AB (ref 101–111)
CO2: 29 mmol/L (ref 22–32)
Calcium: 9.9 mg/dL (ref 8.9–10.3)
Creatinine, Ser: 0.99 mg/dL (ref 0.44–1.00)
GFR calc Af Amer: >60 mL/min (ref >60)
GFR calc non Af Amer: 60 mL/min — ABNORMAL LOW (ref >60)
GLUCOSE: 95 mg/dL (ref 65–99)
POTASSIUM: 3.6 mmol/L (ref 3.5–5.1)
Sodium: 140 mmol/L (ref 135–145)

## 2017-06-02 NOTE — Patient Instructions (Signed)
Your physician has requested that you have an echocardiogram. Echocardiography is a painless test that uses sound waves to create images of your heart. It provides your doctor with information about the size and shape of your heart and how well your heart's chambers and valves are working. This procedure takes approximately one hour. There are no restrictions for this procedure.  Labs drawn today (if we do not call you, then your lab work was stable)   Your physician recommends that you schedule a follow-up appointment in: 3 month with Dr. Shirlee Latch

## 2017-06-04 NOTE — Progress Notes (Signed)
Patient ID: Stacy Mcintosh, female   DOB: [birth year], 62 y.o.   MRN: [id number] PCP: Daylene Posey Cardiology: Dr. Aundra Dubin  62 yo with history of hypothyroidism, nonischemic cardiomyopathy, and PE.  Hypothyroidism has been controlled by Armour thyroid.  She is now seeing Dr. Dwyane Dee for endocrine management.  Echo in 5/17 showed EF 35-40% with diffuse hypokinesis, repeat echo in 01/2016 with improved EF of 60-65%.   In 10/17, she was found to have complete heart block and a Medtronic dual chamber PPM was placed. Last echo in 10/18 showed EF 45% with apical hypokinesis.  She had a Cardiolite in 11/18 with EF 58%, small reversible apical defect, overall low risk study.   Stacy Mcintosh returns today for followup of CHF. She is no longer taking Entresto (too expensive).  She has a myriad of symptoms today.  She has a chronic cough that has been going on for months and is very bothersome.  She continues to be short of breath walking more than about 50 yards or up a flight of stairs.  She has chest tightness frequently that is not related to exertion.  She feels fatigued "all the time."  Weight down 8 lbs.  She feels depressed, says that she does not want any medication for this.  She recently had a CT chest that did not show PNA, pulmonary edema, or ILD.    Labs (10/10): BNP 13, K 3.7, creatinine 0.8 Labs (7/11): K 4.1, creatinine 0.6 Labs (7/12): LDL 143, HDL 74, K 3.3, creatinine 1.0, TSH normal Labs (12/12): K 4, creatinine 0.8, HDL 85, LDL 107 Labs (11/15): K 4.2, creatinine 0.74, LDL 78, HDL 76 Labs (4/17): TSH normal, K 4.5, creatinine 0.92, HCT 42.2 Labs (5/17): LDL 105, HDL 72, BNP 261 Labs (6/17): D dimer negative, BNP 64 Labs (8/17): K 4.2, creatinine 1.05, TSH 0.329 (low), K 4.2, creatinine 1.05, hgb 15.2, BNP 25 Labs (10/17): K 3.5, creatinine 1.06 Labs (9/18): K 3.6, creatinine 1.07, TSH normal  Allergies (verified):  1)  ! Lisinopril  Past Medical History: 1. Hypothyroidism and  history of Graves disease.  She is status post radioactive iodine therapy.  She then developed hypothyroidism, and the hypothyroidism has been difficult to control.   2. Cardiomyopathy, nonischemic.  The patient had a cardiac MRI done during her hospitalization in July 2009.  This showed moderate LV enlargement and global LV hypokinesis.  There was a septal paradox pattern.  EF was calculated to be 27%.  There was mild left atrial enlargement.  The RV was normal in size and function.  There was no ASD or VSD.  There was no evidence for delayed enhancement pattern  in the LV myocardium suggesting that there was no scar from coronary artery disease and no definite evidence for infiltrative cardiomyopathy.  HIV was negative.  She did have an echo done also in the hospital in July 2009 that showed severe mitral regurgitation.  A TEE done following this showed an EF of 25-30% with severe mitral regurgitation.  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  The patient did have a left and right heart catheterization after treatment for congestive heart failure in January 2010.  There was no angiographic coronary disease.  EF had improved to 55-60% with no significant mitral  regurgitation.  The hemodynamics were totally normal on the right heart catheterization.  The cardiomyopathy, I think, was likely due to poorly controlled hypothyroidism.   - Echo (8/10): EF  50-55%, mild LV dilatation, mild-mod MR, PASP 32 - Echo (11/15): EF 30% - Echo (5/17): EF 35-40%, mild-moderate TR, moderate MR, PASP 50 mmHg.  - Echo (10/17): EF 60-65%. Mild TR  - Echo (10/18): EF 45%, apical hypokinesis.  - Cardiolite (11/18): EF 58%, reversible apical defect (small), cannot rule out ischemia, low risk overall.  3. PE/DVT.  July 2009.  The patient had been very sedentary prior to this due to her severe hypothyroidism.  She had  normal antithrombin III.  She was negative for factor V Leiden.  She had normal protein C and  protein S function.  Now off coumadin.  4. Hypertension. 5. History of left bundle-branch block. 6. Asthma. 7. Migraines. 8. ACEI cough 9. Low back pain: L-spine disease.  10. Lyme disease: Treated in 12/16.  11. Complete heart block with Medtronic dual chamber PPM.  12. Depression  Family History: Mother-living; heart attack, HTN, Rheumatism Father-deceased age 82; prostate cancer Brother-living, parathyroid problem Sister- living; arthiritis Brother- living; premature heart attack x 2; DM-insulin Niece- blood clot in groin  Social History: Married with 3 children  Geophysicist/field seismologist. Never smoked ETOH-no   ROS: All systems reviewed and negative except as per HPI.   Current Outpatient Medications  Medication Sig Dispense Refill  . ALPRAZolam (XANAX) 0.5 MG tablet Take 1 tablet (0.5 mg total) by mouth 3 (three) times daily as needed for anxiety. 30 tablet 0  . aspirin EC 81 MG tablet Take 81 mg by mouth daily at 12 noon.    Marland Kitchen azelastine (ASTELIN) 0.1 % nasal spray Place 2 sprays into both nostrils 2 (two) times daily. Use in each nostril as directed 30 mL 0  . beclomethasone (QVAR) 80 MCG/ACT inhaler Inhale 2 puffs into the lungs 2 (two) times daily. 1 Inhaler 12  . benzonatate (TESSALON) 100 MG capsule Take 1-2 capsules (100-200 mg total) by mouth 3 (three) times daily as needed for cough. 120 capsule 0  . carvedilol (COREG) 12.5 MG tablet Take 12.5 mg 2 (two) times daily with a meal by mouth.    . clonazePAM (KLONOPIN) 1 MG tablet Take 1 mg by mouth at bedtime as needed for anxiety.     . cyclobenzaprine (FLEXERIL) 10 MG tablet Take 10 mg by mouth as needed (take as directed).    Marland Kitchen doxepin (SINEQUAN) 10 MG capsule Take 10 mg by mouth at bedtime as needed (Take as directed).    . furosemide (LASIX) 40 MG tablet TAKE 1 TABLET (40 MG TOTAL) BY MOUTH DAILY. MAY TAKE AN ADDITIONAL 40MG DAILY FOR FLUID. 60 tablet 3  . lamoTRIgine (LAMICTAL) 25 MG tablet Take 25 mg by mouth daily.      . pantoprazole (PROTONIX) 40 MG tablet Take 1 tablet (40 mg total) by mouth daily. 90 tablet 3  . simvastatin (ZOCOR) 20 MG tablet Take 1 tablet (20 mg total) by mouth at bedtime. Office visit needed for additional refills 90 tablet 0  . spironolactone (ALDACTONE) 25 MG tablet TAKE 1 TABLET BY MOUTH EVERY DAY 90 tablet 1  . thyroid (ARMOUR THYROID) 60 MG tablet TAKE 1 TABLET (60 MG TOTAL) BY MOUTH DAILY BEFORE BREAKFAST. 90 tablet 3  . traMADol (ULTRAM) 50 MG tablet Take 1-2 tablets (50-100 mg total) by mouth every 6 (six) hours as needed (cough). 30 tablet 0  . Vitamin D, Ergocalciferol, (DRISDOL) 50000 units CAPS capsule Take 1 capsule (50,000 Units total) by mouth every 7 (seven) days. 30 capsule 1   No current facility-administered  medications for this encounter.     BP 138/84   Pulse 78   Wt 158 lb 4 oz (71.8 kg)   SpO2 99%   BMI 30.91 kg/m  General: NAD Neck: No JVD, no thyromegaly or thyroid nodule.  Lungs: Clear to auscultation bilaterally with normal respiratory effort. CV: Nondisplaced PMI.  Heart regular S1/S2, no S3/S4, no murmur.  No peripheral edema.  No carotid bruit.  Normal pedal pulses.  Abdomen: Soft, nontender, no hepatosplenomegaly, no distention.  Skin: Intact without lesions or rashes.  Neurologic: Alert and oriented x 3.  Psych: Depressed affect. Extremities: No clubbing or cyanosis.  HEENT: Normal.   Assessment/Plan:  1. Chronic systolic CHF: Patient has a nonischemic cardiomyopathy that was thought initially to be due to uncontrolled hypothyroidism. Cardiac MRI in 2009 showed no LGE.  EF improved to 50-55% on echo in 2010. Cath in 2010 showed normal coronaries.  However, EF was back down to 30% in 11/15 and was 35-40% in 5/17.  Echo in 01/2016 with improvement to EF 60-65%.  However, most recent echo showed fall in EF to 45% with apical wall motion abnormality (10/18).  Cardiolite in 11/18 was low risk with small area of possible apical ischemia and EF 58%.   She is now RV pacing 99% of the time because of PPM implanted for CHB. She has NYHA class III symptoms and has felt short of breath since the time that she got her pacemaker.  It is possible that her symptoms and lower EF are related to chronic RV pacing.  She is not volume overloaded on exam.  - EF was normal on 11/18 Cardiolite. I will get a repeat echo, if EF is low, I would recommend she have CRT upgrade given chronic RV pacing.  - Continue Spiro 25 mg daily - If EF is low on repeat echo, I will have her restart on Entresto 24/26 bid.  We will try to help her get the medication.   - Continue Coreg 12.5 mg BID.  - BMET/BNP today.  - With fall in EF, she was set up for [place] that showed possible small reversible apical defect.  This was low risk, but given her multiple symptoms, I had recommended right and left heart catheterization.  She ended up deciding against this. I think we can hold off for now.  2. Hypothyroidism: Follows with Dr. Dwyane Dee. TSH was normal in 9/18.  3. HYPERCHOLESTEROLEMIA - Continue simvastatin.  4. Pulmonary embolism: In 2009.  No longer taking Coumadin - Continue daily ASA.  5. Complete heart block: Medtronic PPM, see discussion above.  6. Depression: I think this plays a large role in her symptoms.  She does not want pharmacologic treatment.  I advised her to see her counselor soon.     Followup in 3 months.   Loralie Champagne 06/04/2017

## 2017-06-07 ENCOUNTER — Ambulatory Visit (HOSPITAL_COMMUNITY): Payer: 59 | Attending: Cardiovascular Disease

## 2017-06-07 ENCOUNTER — Other Ambulatory Visit: Payer: Self-pay

## 2017-06-07 DIAGNOSIS — I110 Hypertensive heart disease with heart failure: Secondary | ICD-10-CM | POA: Diagnosis not present

## 2017-06-07 DIAGNOSIS — Z950 Presence of cardiac pacemaker: Secondary | ICD-10-CM | POA: Diagnosis not present

## 2017-06-07 DIAGNOSIS — I509 Heart failure, unspecified: Secondary | ICD-10-CM | POA: Diagnosis not present

## 2017-06-07 DIAGNOSIS — I429 Cardiomyopathy, unspecified: Secondary | ICD-10-CM | POA: Diagnosis not present

## 2017-06-07 DIAGNOSIS — J45909 Unspecified asthma, uncomplicated: Secondary | ICD-10-CM | POA: Insufficient documentation

## 2017-06-07 DIAGNOSIS — I447 Left bundle-branch block, unspecified: Secondary | ICD-10-CM | POA: Insufficient documentation

## 2017-06-09 ENCOUNTER — Telehealth (HOSPITAL_COMMUNITY): Payer: Self-pay

## 2017-06-09 ENCOUNTER — Telehealth (HOSPITAL_COMMUNITY): Payer: Self-pay | Admitting: Pharmacist

## 2017-06-09 MED ORDER — SACUBITRIL-VALSARTAN 24-26 MG PO TABS: 1.0000 | ORAL_TABLET | Freq: Two times a day (BID) | ORAL | 3 refills | Status: DC

## 2017-06-09 NOTE — Telephone Encounter (Signed)
Notes recorded by Teresa Coombs, RN on 06/09/2017 at 9:45 AM EST Pt aware of results, agreeable to med changes (changes made in [facility]), and spoke with Cicero Duck about Pt getting assistance. ------  Notes recorded by Laurey Morale, MD on 06/08/2017 at 11:32 AM EST EF 45-50%, lower than prior. Should restart Entresto 24/46 bid. She will need help getting this medication

## 2017-06-09 NOTE — Telephone Encounter (Signed)
Entresto PA approved by [employer] through 10/13/17.   Tyler Deis. Bonnye Fava, PharmD, BCPS, CPP Clinical Pharmacist Phone: [phone] 06/09/2017 3:35 PM

## 2017-06-19 ENCOUNTER — Encounter: Payer: Self-pay | Admitting: Cardiology

## 2017-06-19 ENCOUNTER — Ambulatory Visit (INDEPENDENT_AMBULATORY_CARE_PROVIDER_SITE_OTHER): Payer: 59 | Admitting: Cardiology

## 2017-06-19 VITALS — BP 130/78 | HR 80 | Ht 60.0 in | Wt 155.0 lb

## 2017-06-19 DIAGNOSIS — I442 Atrioventricular block, complete: Secondary | ICD-10-CM

## 2017-06-19 DIAGNOSIS — I428 Other cardiomyopathies: Secondary | ICD-10-CM | POA: Diagnosis not present

## 2017-06-19 LAB — CUP PACEART INCLINIC DEVICE CHECK
Battery Voltage: 3.02 V
Brady Statistic AP VP Percent: 4.85 %
Brady Statistic AS VS Percent: 0.58 %
Brady Statistic RV Percent Paced: 99.38 %
Date Time Interrogation Session: 20190225173021
Implantable Lead Implant Date: 20171024
Implantable Lead Location: 753860
Implantable Lead Model: 5076
Implantable Pulse Generator Implant Date: 20171024
Lead Channel Impedance Value: 513 Ohm
Lead Channel Pacing Threshold Amplitude: 0.75 V
Lead Channel Pacing Threshold Pulse Width: 0.4 ms
Lead Channel Sensing Intrinsic Amplitude: 1.875 mV
Lead Channel Sensing Intrinsic Amplitude: 12.75 mV
Lead Channel Setting Pacing Amplitude: 2 V
Lead Channel Setting Pacing Pulse Width: 0.4 ms
MDC IDC LEAD IMPLANT DT: 20171024
MDC IDC LEAD LOCATION: 753859
MDC IDC MSMT BATTERY REMAINING LONGEVITY: 92 mo
MDC IDC MSMT LEADCHNL RA IMPEDANCE VALUE: 380 Ohm
MDC IDC MSMT LEADCHNL RA IMPEDANCE VALUE: 437 Ohm
MDC IDC MSMT LEADCHNL RA PACING THRESHOLD AMPLITUDE: 0.5 V
MDC IDC MSMT LEADCHNL RV IMPEDANCE VALUE: 456 Ohm
MDC IDC MSMT LEADCHNL RV PACING THRESHOLD PULSEWIDTH: 0.4 ms
MDC IDC SET LEADCHNL RV PACING AMPLITUDE: 2.5 V
MDC IDC SET LEADCHNL RV SENSING SENSITIVITY: 2 mV
MDC IDC STAT BRADY AP VS PERCENT: 0 %
MDC IDC STAT BRADY AS VP PERCENT: 94.57 %
MDC IDC STAT BRADY RA PERCENT PACED: 4.82 %

## 2017-06-19 NOTE — Progress Notes (Signed)
Electrophysiology Office Note   Date:  06/19/2017   ID:  Stacy Mcintosh, DOB Nov 23, 1955, MRN [id number]  PCP:  Porfirio Oar, PA-C  Cardiologist:  Shirlee Latch Primary Electrophysiologist:  Jodel Mayhall Jorja Loa, MD    Chief Complaint  Patient presents with  . Pacemaker Check    Complete heart block     History of Present Illness: Stacy Mcintosh is a 62 y.o. female who presents today for electrophysiology evaluation.   history of hypothyroidism, nonischemic cardiomyopathy, and PE. Presented to heart failure clinic and on routine EKG was found to be in heart block with a right bundle branch block morphology. She has a history of left bundle branch block in the past. She had a Medtronic dual chamber pacemaker implanted 02/16/16.  She is RV pacing 99% of the time.  Her ejection fraction has since fallen to 45%.  Myoview shows a possible apical wall motion abnormality.  She has plans for left heart catheterization.  Her ejection fraction on her Myoview was 58%.  Today, denies symptoms of palpitations, chest pain, orthopnea, PND, lower extremity edema, claudication, dizziness, presyncope, syncope, bleeding, or neurologic sequela. The patient is tolerating medications without difficulties.  Overall having weakness, fatigue, and shortness of breath.  She says some days she feels normal and able to do all her daily activities, but other days she feels overall drained.  There is no predictability to this.   Past Medical History:  Diagnosis Date  . Anxiety   . Arthritis    "lower back" (02/16/2016)  . BUNDLE BRANCH BLOCK, LEFT 11/12/2008  . CARDIOMYOPATHY 11/12/2008  . CHF (congestive heart failure) (HCC)    a.Echo 5/17: EF 35-40%, moderate MR, mild LAE, mild to moderate TR, PASP 50 mmHg  . Chronic lower back pain   . Depression   . DVT (deep venous thrombosis) (HCC) 2008   LLE  . Graves' disease    S/P radiation; "now up/down"  . HYPERTENSION 10/16/2007  . Hypothyroidism    h/o Graves Disease  .  Migraine    "controlled w/piercings" (02/16/2016)  . Other and unspecified hyperlipidemia 01/19/2010  . PE (pulmonary embolism) 2008   Factor V negative  . Presence of permanent cardiac pacemaker   . Pure hypercholesterolemia 11/18/2008   Past Surgical History:  Procedure Laterality Date  . ABDOMINAL HYSTERECTOMY  1990s  . CARDIAC CATHETERIZATION  2009?  . CESAREAN SECTION  1974; 198j0; 1987   x3  . EP IMPLANTABLE DEVICE N/A 02/16/2016   Procedure: Pacemaker Implant;  Surgeon: Mani Celestin Jorja Loa, MD;  Location: [facility] INVASIVE [facility];  Service: Cardiovascular;  Laterality: N/A;  . HERNIA REPAIR  1956  . INSERT / REPLACE / REMOVE PACEMAKER       Current Outpatient Medications  Medication Sig Dispense Refill  . ALPRAZolam (XANAX) 0.5 MG tablet Take 1 tablet (0.5 mg total) by mouth 3 (three) times daily as needed for anxiety. 30 tablet 0  . aspirin EC 81 MG tablet Take 81 mg by mouth daily at 12 noon.    . benzonatate (TESSALON) 100 MG capsule Take 1-2 capsules (100-200 mg total) by mouth 3 (three) times daily as needed for cough. 120 capsule 0  . carvedilol (COREG) 12.5 MG tablet Take 12.5 mg 2 (two) times daily with a meal by mouth.    . clonazePAM (KLONOPIN) 1 MG tablet Take 1 mg by mouth at bedtime as needed for anxiety.     Marland Kitchen doxepin (SINEQUAN) 10 MG capsule Take 10 mg by mouth at bedtime  as needed (Take as directed).    . furosemide (LASIX) 40 MG tablet TAKE 1 TABLET (40 MG TOTAL) BY MOUTH DAILY. MAY TAKE AN ADDITIONAL 40MG  DAILY FOR FLUID. 60 tablet 3  . lamoTRIgine (LAMICTAL) 25 MG tablet Take 25 mg by mouth daily.    . pantoprazole (PROTONIX) 40 MG tablet Take 1 tablet (40 mg total) by mouth daily. 90 tablet 3  . sacubitril-valsartan (ENTRESTO) 24-26 MG Take 1 tablet by mouth 2 (two) times daily. 60 tablet 3  . simvastatin (ZOCOR) 20 MG tablet Take 1 tablet (20 mg total) by mouth at bedtime. Office visit needed for additional refills 90 tablet 0  . spironolactone (ALDACTONE) 25 MG  tablet TAKE 1 TABLET BY MOUTH EVERY DAY 90 tablet 1  . thyroid (ARMOUR THYROID) 60 MG tablet TAKE 1 TABLET (60 MG TOTAL) BY MOUTH DAILY BEFORE BREAKFAST. 90 tablet 3  . traMADol (ULTRAM) 50 MG tablet Take 1-2 tablets (50-100 mg total) by mouth every 6 (six) hours as needed (cough). 30 tablet 0   No current facility-administered medications for this visit.     Allergies:   Augmentin [amoxicillin-pot clavulanate] and Lisinopril   Social History:  The patient  reports that  has never smoked. she has never used smokeless tobacco. She reports that she drinks alcohol. She reports that she does not use drugs.   Family History:  The patient's family history includes Arthritis in her sister; Diabetes in her brother; Heart attack in her brother and mother; Hypertension in her mother and sister; Ovarian cancer in her mother; Prostate cancer in her father; Rheumatologic disease in her mother; Sudden death in her maternal grandfather; Thyroid disease in her brother.    ROS:  Please see the history of present illness.   Otherwise, review of systems is positive for weight, fatigue, shortness of breath.   All other systems are reviewed and negative.   PHYSICAL EXAM: VS:  There were no vitals taken for this visit. , BMI There is no height or weight on file to calculate BMI. GEN: Well nourished, well developed, in no acute distress  HEENT: normal  Neck: no JVD, carotid bruits, or masses Cardiac: RRR; no murmurs, rubs, or gallops,no edema  Respiratory:  clear to auscultation bilaterally, normal work of breathing GI: soft, nontender, nondistended, + BS MS: no deformity or atrophy  Skin: warm and dry, device site well healed Neuro:  Strength and sensation are intact Psych: euthymic mood, full affect  EKG:  EKG is not ordered today. Personal review of the ekg ordered 01/20/17 shows a sensed V pace  Personal review of the device interrogation today. Results in Paceart   Recent Labs: 01/20/2017: TSH  4.135 06/02/2017: BUN 8; Creatinine, Ser 0.99; Potassium 3.6; Sodium 140    Lipid Panel     Component Value Date/Time   CHOL 195 09/17/2015 1301   TRIG 91 09/17/2015 1301   HDL 72 09/17/2015 1301   CHOLHDL 2.7 09/17/2015 1301   VLDL 18 09/17/2015 1301   LDLCALC 105 (H) 09/17/2015 1301   LDLDIRECT 140.8 04/23/2012 1011     Wt Readings from Last 3 Encounters:  06/02/17 158 lb 4 oz (71.8 kg)  05/30/17 156 lb 9.6 oz (71 kg)  05/02/17 154 lb 12.8 oz (70.2 kg)      Other studies Reviewed: Additional studies/ records that were reviewed today include: TTE 06/07/17 Review of the above records today demonstrates:  - Left ventricle: Abnormal septal motion apical hypokinesis The   cavity size was  normal. Wall thickness was increased in a pattern   of mild LVH. Systolic function was mildly reduced. The estimated   ejection fraction was in the range of 45% to 50%. Doppler   parameters are consistent with abnormal left ventricular   relaxation (grade 1 diastolic dysfunction). - Atrial septum: No defect or patent foramen ovale was identified. - Pulmonary arteries: PA peak pressure: 31 mm Hg (S).  Myoview 03/06/17  Nuclear stress EF: 58% with apical akinesis.  There was no ST segment deviation noted during stress.  There is a small defect of mild severity present in the apex location. The defect is non-reversible and consistent with infarct. No ischemia noted.  This is a low risk study.  The left ventricular ejection fraction is normal (55-65%).  ASSESSMENT AND PLAN:  1.  Complete AV block: Medtronic dual-chamber pacemaker implanted 01/2016.  Device functioning appropriately.  Is V pacing 99% of the time.  Her ejection fraction has remained stable at 45-50% and thus I do not feel that she would benefit from device upgrade.  2. Nonischemic cardiomyopathy: Ejection fraction at 45-50% on most recent echo.  No changes at this time.  Labs/ tests ordered today include:  No orders of the  defined types were placed in this encounter.  Case discussed with primary cardiology  Disposition:   FU with Aeron Donaghey 3 months  Signed, Mylena Sedberry Jorja Loa, MD  06/19/2017 2:37 PM     [facility] [address] [place] [address] [phone] (office) [phone] (fax)

## 2017-06-19 NOTE — Patient Instructions (Addendum)
Medication Instructions:  Your physician recommends that you continue on your current medications as directed. Please refer to the Current Medication list given to you today.  *If you need a refill on your cardiac medications before your next appointment, please call your pharmacy*  Labwork: None ordered  Testing/Procedures: None ordered  Follow-Up: Remote monitoring is used to monitor your Pacemaker or ICD from home. This monitoring reduces the number of office visits required to check your device to one time per year. It allows [place] to keep an eye on the functioning of your device to ensure it is working properly. You are scheduled for a device check from home on 12/26/2017. You may send your transmission at any time that day. If you have a wireless device, the transmission will be sent automatically. After your physician reviews your transmission, you will receive a postcard with your next transmission date.  Your physician wants you to follow-up in: 1 year with Dr. Elberta Fortis.  You will receive a reminder letter in the mail two months in advance. If you don't receive a letter, please call our office to schedule the follow-up appointment.  Thank you for choosing [facility]!!   Dory Horn, RN [phone]

## 2017-07-07 ENCOUNTER — Ambulatory Visit: Payer: Managed Care, Other (non HMO) | Admitting: Physician Assistant

## 2017-07-17 ENCOUNTER — Other Ambulatory Visit: Payer: Self-pay | Admitting: Cardiology

## 2017-07-18 NOTE — Telephone Encounter (Signed)
This is a CHF pt 

## 2017-07-31 ENCOUNTER — Encounter: Payer: Self-pay | Admitting: Physician Assistant

## 2017-09-04 ENCOUNTER — Encounter (HOSPITAL_COMMUNITY): Payer: Self-pay

## 2017-09-04 ENCOUNTER — Ambulatory Visit (HOSPITAL_COMMUNITY)
Admission: RE | Admit: 2017-09-04 | Discharge: 2017-09-04 | Disposition: A | Discharge status: Home / Self Care | Payer: 59 | Source: Ambulatory Visit | Attending: Cardiology | Admitting: Cardiology

## 2017-09-04 ENCOUNTER — Encounter (HOSPITAL_COMMUNITY): Payer: Self-pay | Admitting: Cardiology

## 2017-09-04 VITALS — BP 95/65 | HR 77 | Wt 157.0 lb

## 2017-09-04 DIAGNOSIS — Z79899 Other long term (current) drug therapy: Secondary | ICD-10-CM | POA: Diagnosis not present

## 2017-09-04 DIAGNOSIS — Z7982 Long term (current) use of aspirin: Secondary | ICD-10-CM | POA: Insufficient documentation

## 2017-09-04 DIAGNOSIS — F329 Major depressive disorder, single episode, unspecified: Secondary | ICD-10-CM | POA: Insufficient documentation

## 2017-09-04 DIAGNOSIS — Z86711 Personal history of pulmonary embolism: Secondary | ICD-10-CM | POA: Diagnosis not present

## 2017-09-04 DIAGNOSIS — E7800 Pure hypercholesterolemia, unspecified: Secondary | ICD-10-CM | POA: Insufficient documentation

## 2017-09-04 DIAGNOSIS — I428 Other cardiomyopathies: Secondary | ICD-10-CM | POA: Insufficient documentation

## 2017-09-04 DIAGNOSIS — Z950 Presence of cardiac pacemaker: Secondary | ICD-10-CM | POA: Insufficient documentation

## 2017-09-04 DIAGNOSIS — I442 Atrioventricular block, complete: Secondary | ICD-10-CM | POA: Diagnosis not present

## 2017-09-04 DIAGNOSIS — E039 Hypothyroidism, unspecified: Secondary | ICD-10-CM | POA: Diagnosis not present

## 2017-09-04 DIAGNOSIS — R0602 Shortness of breath: Secondary | ICD-10-CM

## 2017-09-04 DIAGNOSIS — I5022 Chronic systolic (congestive) heart failure: Secondary | ICD-10-CM | POA: Insufficient documentation

## 2017-09-04 DIAGNOSIS — I110 Hypertensive heart disease with heart failure: Secondary | ICD-10-CM | POA: Diagnosis not present

## 2017-09-04 LAB — BASIC METABOLIC PANEL
Anion gap: 10 (ref 5–15)
BUN: 14 mg/dL (ref 6–20)
CALCIUM: 10.1 mg/dL (ref 8.9–10.3)
CHLORIDE: 99 mmol/L — AB (ref 101–111)
CO2: 29 mmol/L (ref 22–32)
CREATININE: 1.21 mg/dL — AB (ref 0.44–1.00)
GFR calc non Af Amer: 47 mL/min — ABNORMAL LOW (ref >60)
GFR, EST AFRICAN AMERICAN: 54 mL/min — AB (ref >60)
Glucose, Bld: 113 mg/dL — ABNORMAL HIGH (ref 65–99)
Potassium: 4.1 mmol/L (ref 3.5–5.1)
SODIUM: 138 mmol/L (ref 135–145)

## 2017-09-04 LAB — CBC
HCT: 44.8 % (ref 36.0–46.0)
Hemoglobin: 14.6 g/dL (ref 12.0–15.0)
MCH: 31 pg (ref 26.0–34.0)
MCHC: 32.6 g/dL (ref 30.0–36.0)
MCV: 95.1 fL (ref 78.0–100.0)
PLATELETS: 364 10*3/uL (ref 150–400)
RBC: 4.71 MIL/uL (ref 3.87–5.11)
RDW: 13.5 % (ref 11.5–15.5)
WBC: 7.9 10*3/uL (ref 4.0–10.5)

## 2017-09-04 LAB — TSH: TSH: 9.708 u[IU]/mL — AB (ref 0.350–4.500)

## 2017-09-04 LAB — PROTIME-INR
INR: 0.98
PROTHROMBIN TIME: 12.9 s (ref 11.4–15.2)

## 2017-09-04 LAB — BRAIN NATRIURETIC PEPTIDE: B NATRIURETIC PEPTIDE 5: 12.7 pg/mL (ref 0.0–100.0)

## 2017-09-04 MED ORDER — CARVEDILOL 6.25 MG PO TABS: 6.2500 mg | ORAL_TABLET | Freq: Two times a day (BID) | ORAL | 3 refills | Status: DC

## 2017-09-04 NOTE — Progress Notes (Signed)
ReDS Vest - 09/04/17 1200      ReDS Vest   MR   No    Fitting Posture  Standing    Height Marker  Short    Ruler Value  14    Center Strip  Aligned    ReDS Value  16

## 2017-09-04 NOTE — Progress Notes (Signed)
Patient ID: Stacy Mcintosh, female   DOB: [birth year], 62 y.o.   MRN: [id number] PCP: Deneen Harts Cardiology: Dr. Shirlee Latch  62 yo with history of hypothyroidism, nonischemic cardiomyopathy, and PE.  Hypothyroidism has been controlled by Armour thyroid.  She is now seeing Dr. Lucianne Muss for endocrine management.  Echo in 5/17 showed EF 35-40% with diffuse hypokinesis, repeat echo in 01/2016 with improved EF of 60-65%.   In 10/17, she was found to have complete heart block and a Medtronic dual chamber PPM was placed. Last echo in 10/18 showed EF 45% with apical hypokinesis.  She had a Cardiolite in 11/18 with EF 58%, small reversible apical defect, overall low risk study. Echo in 2/19 showed EF 45-50% with apical hypokinesis.  She saw Dr. Elberta Fortis who did not recommend CRT upgrade as EF was only very mildly decreased.   Stacy Mcintosh returns today for followup of CHF. She continues to be very symptomatic. She is depressed and tearful today, worried about her physical condition. Unable to keep up with family and friends.  She fatigues very easily.  She is short of breath with exertion, cannot walk more than about a block.  She gets chest tightness if she walks "fast or for a long distance."  She has to lean on the cart at the grocery store. She has episodes of lightheadedness. SBP in 63s today.    REDS vest measurement 16%  Labs (10/10): BNP 13, K 3.7, creatinine 0.8 Labs (7/11): K 4.1, creatinine 0.6 Labs (7/12): LDL 143, HDL 74, K 3.3, creatinine 1.0, TSH normal Labs (12/12): K 4, creatinine 0.8, HDL 85, LDL 107 Labs (11/15): K 4.2, creatinine 0.74, LDL 78, HDL 76 Labs (4/17): TSH normal, K 4.5, creatinine 0.92, HCT 42.2 Labs (5/17): LDL 105, HDL 72, BNP 261 Labs (6/17): D dimer negative, BNP 64 Labs (8/17): K 4.2, creatinine 1.05, TSH 0.329 (low), K 4.2, creatinine 1.05, hgb 15.2, BNP 25 Labs (10/17): K 3.5, creatinine 1.06 Labs (9/18): K 3.6, creatinine 1.07, TSH normal Labs (2/19): K 3.6,  creatinine 0.99  Allergies (verified):  1)  ! Lisinopril  Past Medical History: 1. Hypothyroidism and history of Graves disease.  She is status post radioactive iodine therapy.  She then developed hypothyroidism, and the hypothyroidism has been difficult to control.   2. Cardiomyopathy, nonischemic.  The patient had a cardiac MRI done during her hospitalization in July 2009.  This showed moderate LV enlargement and global LV hypokinesis.  There was a septal paradox pattern.  EF was calculated to be 27%.  There was mild left atrial enlargement.  The RV was normal in size and function.  There was no ASD or VSD.  There was no evidence for delayed enhancement pattern  in the LV myocardium suggesting that there was no scar from coronary artery disease and no definite evidence for infiltrative cardiomyopathy.  HIV was negative.  She did have an echo done also in the hospital in July 2009 that showed severe mitral regurgitation.  A TEE done following this showed an EF of 25-30% with severe mitral regurgitation.  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  The patient did have a left and right heart catheterization after treatment for congestive heart failure in January 2010.  There was no angiographic coronary disease.  EF had improved to 55-60% with no significant mitral  regurgitation.  The hemodynamics were totally normal on the right heart catheterization.  The cardiomyopathy, I think, was likely  due to poorly controlled hypothyroidism.   - Echo (8/10): EF 50-55%, mild LV dilatation, mild-mod MR, PASP 32 - Echo (11/15): EF 30% - Echo (5/17): EF 35-40%, mild-moderate TR, moderate MR, PASP 50 mmHg.  - Echo (10/17): EF 60-65%. Mild TR  - Echo (10/18): EF 45%, apical hypokinesis.  - Cardiolite (11/18): EF 58%, reversible apical defect (small), cannot rule out ischemia, low risk overall.  - Echo (2/19): EF 45-50%, apical hypokinesis 3. PE/DVT.  July 2009.  The patient had been very  sedentary prior to this due to her severe hypothyroidism.  She had  normal antithrombin III.  She was negative for factor V Leiden.  She had normal protein C and protein S function.  Now off coumadin.  4. Hypertension. 5. History of left bundle-branch block. 6. Asthma. 7. Migraines. 8. ACEI cough 9. Low back pain: L-spine disease.  10. Lyme disease: Treated in 12/16.  11. Complete heart block with Medtronic dual chamber PPM.  12. 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   Current Outpatient Medications  Medication Sig Dispense Refill  . ALPRAZolam (XANAX) 0.5 MG tablet Take 1 tablet (0.5 mg total) by mouth 3 (three) times daily as needed for anxiety. 30 tablet 0  . aspirin EC 81 MG tablet Take 81 mg by mouth daily at 12 noon.    . benzonatate (TESSALON) 100 MG capsule Take 1-2 capsules (100-200 mg total) by mouth 3 (three) times daily as needed for cough. 120 capsule 0  . carvedilol (COREG) 6.25 MG tablet Take 1 tablet (6.25 mg total) by mouth 2 (two) times daily with a meal. 60 tablet 3  . clonazePAM (KLONOPIN) 1 MG tablet Take 1 mg by mouth at bedtime as needed for anxiety.     Marland Kitchen doxepin (SINEQUAN) 10 MG capsule Take 10 mg by mouth at bedtime as needed (Take as directed).    . furosemide (LASIX) 40 MG tablet TAKE 1 TABLET (40 MG TOTAL) BY MOUTH DAILY. MAY TAKE AN ADDITIONAL 40MG  DAILY FOR FLUID. 60 tablet 3  . lamoTRIgine (LAMICTAL) 25 MG tablet Take 25 mg by mouth daily.    . pantoprazole (PROTONIX) 40 MG tablet Take 1 tablet (40 mg total) by mouth daily. 90 tablet 3  . sacubitril-valsartan (ENTRESTO) 49-51 MG Take 1  tablet by mouth 2 (two) times daily.    . simvastatin (ZOCOR) 20 MG tablet Take 1 tablet (20 mg total) by mouth at bedtime. Office visit needed for additional refills 90 tablet 0  . spironolactone (ALDACTONE) 25 MG tablet TAKE 1 TABLET BY MOUTH EVERY DAY 90 tablet 1  . thyroid (ARMOUR THYROID) 60 MG tablet TAKE 1 TABLET (60 MG TOTAL) BY MOUTH DAILY BEFORE BREAKFAST. 90 tablet 3  . traMADol (ULTRAM) 50 MG tablet Take 1-2 tablets (50-100 mg total) by mouth every 6 (six) hours as needed (cough). 30 tablet 0   No current facility-administered medications for this encounter.     BP 95/65   Pulse 77   Wt 157 lb (71.2 kg)   SpO2 97%   BMI 30.66 kg/m  General: NAD Neck: No JVD, no thyromegaly or thyroid nodule.  Lungs: Clear to auscultation bilaterally with normal respiratory effort. CV: Nondisplaced  PMI.  Heart regular S1/S2, no S3/S4, no murmur.  No peripheral edema.  No carotid bruit.  Normal pedal pulses.  Abdomen: Soft, nontender, no hepatosplenomegaly, no distention.  Skin: Intact without lesions or rashes.  Neurologic: Alert and oriented x 3.  Psych: Normal affect. Extremities: No clubbing or cyanosis.  HEENT: Normal.    Assessment/Plan:  1. Chronic systolic CHF: Patient has a nonischemic cardiomyopathy that was thought initially to be due to uncontrolled hypothyroidism. Cardiac MRI in 2009 showed no LGE.  EF improved to 50-55% on echo in 2010. Cath in 2010 showed normal coronaries.  However, EF was back down to 30% in 11/15 and was 35-40% in 5/17.  Echo in 01/2016 with improvement to EF 60-65%.  However, 10/18 echo showed fall in EF to 45% with apical wall motion abnormality.  Cardiolite in 11/18 showed a small area of possible apical ischemia and EF 58%. Most recent echo in 2/19 showed EF 45-50% with apical hypokinesis. She is now RV pacing 99% of the time because of PPM implanted for CHB, Dr Elberta Fortis opted against CRT upgrade as EF is only mildly depressed. She has NYHA class III  symptoms with exertional dyspnea and chest pain.  She is not volume overloaded by exam or by REDS vest measurement today.  - I am concerned that her symptoms could be an ischemic equivalent given apical wall motion abnormality on echo and reversible apical defect on Cardiolite.  I will arrange for RHC/LHC.  We discussed risks/benefits and she agrees to proceed.  - Continue Spiro 25 mg daily - Continue Entresto 49/51 bid.   - With fatigue and low BP, will decrease Coreg to 6.25 mg bid.  - BMET/BNP today.   2. Hypothyroidism: Follows with Dr. Lucianne Muss.   3. HYPERCHOLESTEROLEMIA: Continue simvastatin.  4. Pulmonary embolism: In 2009.  No longer taking Coumadin - Continue daily ASA.  5. Complete heart block: Medtronic PPM, see discussion above.  6. Depression: I think this plays a large role in her symptoms.  She does not want pharmacologic treatment. She has a Veterinary surgeon.     Followup with NP/PA 2 wks after cath.  Marca Ancona 09/04/2017

## 2017-09-04 NOTE — H&P (View-Only) (Signed)
Patient ID: Stacy Mcintosh, female   DOB: [birth year], 62 y.o.   MRN: [id number] PCP: Deneen Harts Cardiology: Dr. Shirlee Latch  62 yo with history of hypothyroidism, nonischemic cardiomyopathy, and PE.  Hypothyroidism has been controlled by Armour thyroid.  She is now seeing Dr. Lucianne Muss for endocrine management.  Echo in 5/17 showed EF 35-40% with diffuse hypokinesis, repeat echo in 01/2016 with improved EF of 60-65%.   In 10/17, she was found to have complete heart block and a Medtronic dual chamber PPM was placed. Last echo in 10/18 showed EF 45% with apical hypokinesis.  She had a Cardiolite in 11/18 with EF 58%, small reversible apical defect, overall low risk study. Echo in 2/19 showed EF 45-50% with apical hypokinesis.  She saw Dr. Elberta Fortis who did not recommend CRT upgrade as EF was only very mildly decreased.   Stacy Mcintosh returns today for followup of CHF. She continues to be very symptomatic. She is depressed and tearful today, worried about her physical condition. Unable to keep up with family and friends.  She fatigues very easily.  She is short of breath with exertion, cannot walk more than about a block.  She gets chest tightness if she walks "fast or for a long distance."  She has to lean on the cart at the grocery store. She has episodes of lightheadedness. SBP in 90s today.    REDS vest measurement 16%  Labs (10/10): BNP 13, K 3.7, creatinine 0.8 Labs (7/11): K 4.1, creatinine 0.6 Labs (7/12): LDL 143, HDL 74, K 3.3, creatinine 1.0, TSH normal Labs (12/12): K 4, creatinine 0.8, HDL 85, LDL 107 Labs (11/15): K 4.2, creatinine 0.74, LDL 78, HDL 76 Labs (4/17): TSH normal, K 4.5, creatinine 0.92, HCT 42.2 Labs (5/17): LDL 105, HDL 72, BNP 261 Labs (6/17): D dimer negative, BNP 64 Labs (8/17): K 4.2, creatinine 1.05, TSH 0.329 (low), K 4.2, creatinine 1.05, hgb 15.2, BNP 25 Labs (10/17): K 3.5, creatinine 1.06 Labs (9/18): K 3.6, creatinine 1.07, TSH normal Labs (2/19): K 3.6,  creatinine 0.99  Allergies (verified):  1)  ! Lisinopril  Past Medical History: 1. Hypothyroidism and history of Graves disease.  She is status post radioactive iodine therapy.  She then developed hypothyroidism, and the hypothyroidism has been difficult to control.   2. Cardiomyopathy, nonischemic.  The patient had a cardiac MRI done during her hospitalization in July 2009.  This showed moderate LV enlargement and global LV hypokinesis.  There was a septal paradox pattern.  EF was calculated to be 27%.  There was mild left atrial enlargement.  The RV was normal in size and function.  There was no ASD or VSD.  There was no evidence for delayed enhancement pattern  in the LV myocardium suggesting that there was no scar from coronary artery disease and no definite evidence for infiltrative cardiomyopathy.  HIV was negative.  She did have an echo done also in the hospital in July 2009 that showed severe mitral regurgitation.  A TEE done following this showed an EF of 25-30% with severe mitral regurgitation.  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  The patient did have a left and right heart catheterization after treatment for congestive heart failure in January 2010.  There was no angiographic coronary disease.  EF had improved to 55-60% with no significant mitral  regurgitation.  The hemodynamics were totally normal on the right heart catheterization.  The cardiomyopathy, I think, was likely  due to poorly controlled hypothyroidism.   - Echo (8/10): EF 50-55%, mild LV dilatation, mild-mod MR, PASP 32 - Echo (11/15): EF 30% - Echo (5/17): EF 35-40%, mild-moderate TR, moderate MR, PASP 50 mmHg.  - Echo (10/17): EF 60-65%. Mild TR  - Echo (10/18): EF 45%, apical hypokinesis.  - Cardiolite (11/18): EF 58%, reversible apical defect (small), cannot rule out ischemia, low risk overall.  - Echo (2/19): EF 45-50%, apical hypokinesis 3. PE/DVT.  July 2009.  The patient had been very  sedentary prior to this due to her severe hypothyroidism.  She had  normal antithrombin III.  She was negative for factor V Leiden.  She had normal protein C and protein S function.  Now off coumadin.  4. Hypertension. 5. History of left bundle-branch block. 6. Asthma. 7. Migraines. 8. ACEI cough 9. Low back pain: L-spine disease.  10. Lyme disease: Treated in 12/16.  11. Complete heart block with Medtronic dual chamber PPM.  12. 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   Current Outpatient Medications  Medication Sig Dispense Refill  . ALPRAZolam (XANAX) 0.5 MG tablet Take 1 tablet (0.5 mg total) by mouth 3 (three) times daily as needed for anxiety. 30 tablet 0  . aspirin EC 81 MG tablet Take 81 mg by mouth daily at 12 noon.    . benzonatate (TESSALON) 100 MG capsule Take 1-2 capsules (100-200 mg total) by mouth 3 (three) times daily as needed for cough. 120 capsule 0  . carvedilol (COREG) 6.25 MG tablet Take 1 tablet (6.25 mg total) by mouth 2 (two) times daily with a meal. 60 tablet 3  . clonazePAM (KLONOPIN) 1 MG tablet Take 1 mg by mouth at bedtime as needed for anxiety.     Marland Kitchen doxepin (SINEQUAN) 10 MG capsule Take 10 mg by mouth at bedtime as needed (Take as directed).    . furosemide (LASIX) 40 MG tablet TAKE 1 TABLET (40 MG TOTAL) BY MOUTH DAILY. MAY TAKE AN ADDITIONAL 40MG  DAILY FOR FLUID. 60 tablet 3  . lamoTRIgine (LAMICTAL) 25 MG tablet Take 25 mg by mouth daily.    . pantoprazole (PROTONIX) 40 MG tablet Take 1 tablet (40 mg total) by mouth daily. 90 tablet 3  . sacubitril-valsartan (ENTRESTO) 49-51 MG Take 1  tablet by mouth 2 (two) times daily.    . simvastatin (ZOCOR) 20 MG tablet Take 1 tablet (20 mg total) by mouth at bedtime. Office visit needed for additional refills 90 tablet 0  . spironolactone (ALDACTONE) 25 MG tablet TAKE 1 TABLET BY MOUTH EVERY DAY 90 tablet 1  . thyroid (ARMOUR THYROID) 60 MG tablet TAKE 1 TABLET (60 MG TOTAL) BY MOUTH DAILY BEFORE BREAKFAST. 90 tablet 3  . traMADol (ULTRAM) 50 MG tablet Take 1-2 tablets (50-100 mg total) by mouth every 6 (six) hours as needed (cough). 30 tablet 0   No current facility-administered medications for this encounter.     BP 95/65   Pulse 77   Wt 157 lb (71.2 kg)   SpO2 97%   BMI 30.66 kg/m  General: NAD Neck: No JVD, no thyromegaly or thyroid nodule.  Lungs: Clear to auscultation bilaterally with normal respiratory effort. CV: Nondisplaced  PMI.  Heart regular S1/S2, no S3/S4, no murmur.  No peripheral edema.  No carotid bruit.  Normal pedal pulses.  Abdomen: Soft, nontender, no hepatosplenomegaly, no distention.  Skin: Intact without lesions or rashes.  Neurologic: Alert and oriented x 3.  Psych: Normal affect. Extremities: No clubbing or cyanosis.  HEENT: Normal.    Assessment/Plan:  1. Chronic systolic CHF: Patient has a nonischemic cardiomyopathy that was thought initially to be due to uncontrolled hypothyroidism. Cardiac MRI in 2009 showed no LGE.  EF improved to 50-55% on echo in 2010. Cath in 2010 showed normal coronaries.  However, EF was back down to 30% in 11/15 and was 35-40% in 5/17.  Echo in 01/2016 with improvement to EF 60-65%.  However, 10/18 echo showed fall in EF to 45% with apical wall motion abnormality.  Cardiolite in 11/18 showed a small area of possible apical ischemia and EF 58%. Most recent echo in 2/19 showed EF 45-50% with apical hypokinesis. She is now RV pacing 99% of the time because of PPM implanted for CHB, Dr Elberta Fortis opted against CRT upgrade as EF is only mildly depressed. She has NYHA class III  symptoms with exertional dyspnea and chest pain.  She is not volume overloaded by exam or by REDS vest measurement today.  - I am concerned that her symptoms could be an ischemic equivalent given apical wall motion abnormality on echo and reversible apical defect on Cardiolite.  I will arrange for RHC/LHC.  We discussed risks/benefits and she agrees to proceed.  - Continue Spiro 25 mg daily - Continue Entresto 49/51 bid.   - With fatigue and low BP, will decrease Coreg to 6.25 mg bid.  - BMET/BNP today.   2. Hypothyroidism: Follows with Dr. Lucianne Muss.   3. HYPERCHOLESTEROLEMIA: Continue simvastatin.  4. Pulmonary embolism: In 2009.  No longer taking Coumadin - Continue daily ASA.  5. Complete heart block: Medtronic PPM, see discussion above.  6. Depression: I think this plays a large role in her symptoms.  She does not want pharmacologic treatment. She has a Veterinary surgeon.     Followup with NP/PA 2 wks after cath.  Marca Ancona 09/04/2017

## 2017-09-04 NOTE — Patient Instructions (Signed)
Decrease Carvadilol 6.25 mg (1 tab), twice a day  Your physician has requested that you have a cardiac catheterization. Cardiac catheterization is used to diagnose and/or treat various heart conditions. Doctors may recommend this procedure for a number of different reasons. The most common reason is to evaluate chest pain. Chest pain can be a symptom of coronary artery disease (CAD), and cardiac catheterization can show whether plaque is narrowing or blocking your heart's arteries. This procedure is also used to evaluate the valves, as well as measure the blood flow and oxygen levels in different parts of your heart. For further information please visit [email or web address] Please follow instruction sheet, as given.   Labs drawn today (if we do not call you, then your lab work was stable)    Your physician recommends that you schedule a follow-up appointment in: 2 weeks post cath with APP

## 2017-09-05 ENCOUNTER — Other Ambulatory Visit (HOSPITAL_COMMUNITY): Payer: Self-pay

## 2017-09-05 DIAGNOSIS — I5022 Chronic systolic (congestive) heart failure: Secondary | ICD-10-CM

## 2017-09-07 ENCOUNTER — Other Ambulatory Visit (HOSPITAL_COMMUNITY): Payer: Self-pay | Admitting: Cardiology

## 2017-09-08 ENCOUNTER — Ambulatory Visit (HOSPITAL_COMMUNITY)
Admission: RE | Admit: 2017-09-08 | Discharge: 2017-09-08 | Disposition: A | Discharge status: Home / Self Care | Payer: 59 | Source: Ambulatory Visit | Attending: Cardiology | Admitting: Cardiology

## 2017-09-08 ENCOUNTER — Encounter (HOSPITAL_COMMUNITY): Payer: Self-pay | Admitting: Cardiology

## 2017-09-08 ENCOUNTER — Encounter (HOSPITAL_COMMUNITY)
Admission: RE | Disposition: A | Discharge status: Home / Self Care | Payer: Self-pay | Source: Ambulatory Visit | Attending: Cardiology

## 2017-09-08 DIAGNOSIS — Z79899 Other long term (current) drug therapy: Secondary | ICD-10-CM | POA: Insufficient documentation

## 2017-09-08 DIAGNOSIS — I442 Atrioventricular block, complete: Secondary | ICD-10-CM | POA: Diagnosis not present

## 2017-09-08 DIAGNOSIS — Z8619 Personal history of other infectious and parasitic diseases: Secondary | ICD-10-CM | POA: Insufficient documentation

## 2017-09-08 DIAGNOSIS — J45909 Unspecified asthma, uncomplicated: Secondary | ICD-10-CM | POA: Diagnosis not present

## 2017-09-08 DIAGNOSIS — Z86711 Personal history of pulmonary embolism: Secondary | ICD-10-CM | POA: Diagnosis not present

## 2017-09-08 DIAGNOSIS — E7800 Pure hypercholesterolemia, unspecified: Secondary | ICD-10-CM | POA: Insufficient documentation

## 2017-09-08 DIAGNOSIS — Z7982 Long term (current) use of aspirin: Secondary | ICD-10-CM | POA: Diagnosis not present

## 2017-09-08 DIAGNOSIS — F329 Major depressive disorder, single episode, unspecified: Secondary | ICD-10-CM | POA: Diagnosis not present

## 2017-09-08 DIAGNOSIS — I5022 Chronic systolic (congestive) heart failure: Secondary | ICD-10-CM

## 2017-09-08 DIAGNOSIS — R0609 Other forms of dyspnea: Secondary | ICD-10-CM | POA: Diagnosis not present

## 2017-09-08 DIAGNOSIS — I509 Heart failure, unspecified: Secondary | ICD-10-CM | POA: Diagnosis not present

## 2017-09-08 DIAGNOSIS — I429 Cardiomyopathy, unspecified: Secondary | ICD-10-CM | POA: Diagnosis not present

## 2017-09-08 DIAGNOSIS — E039 Hypothyroidism, unspecified: Secondary | ICD-10-CM | POA: Diagnosis not present

## 2017-09-08 DIAGNOSIS — I110 Hypertensive heart disease with heart failure: Secondary | ICD-10-CM | POA: Insufficient documentation

## 2017-09-08 DIAGNOSIS — Z8349 Family history of other endocrine, nutritional and metabolic diseases: Secondary | ICD-10-CM | POA: Diagnosis not present

## 2017-09-08 DIAGNOSIS — R079 Chest pain, unspecified: Secondary | ICD-10-CM | POA: Insufficient documentation

## 2017-09-08 DIAGNOSIS — Z8249 Family history of ischemic heart disease and other diseases of the circulatory system: Secondary | ICD-10-CM | POA: Insufficient documentation

## 2017-09-08 HISTORY — PX: RIGHT/LEFT HEART CATH AND CORONARY ANGIOGRAPHY: CATH118266

## 2017-09-08 LAB — POCT I-STAT 3, VENOUS BLOOD GAS (G3P V)
Acid-base deficit: 1 mmol/L (ref 0.0–2.0)
Acid-base deficit: 2 mmol/L (ref 0.0–2.0)
BICARBONATE: 24.2 mmol/L (ref 20.0–28.0)
Bicarbonate: 24.8 mmol/L (ref 20.0–28.0)
O2 SAT: 77 %
O2 Saturation: 77 %
PCO2 VEN: 44.4 mmHg (ref 44.0–60.0)
PCO2 VEN: 45.3 mmHg (ref 44.0–60.0)
PH VEN: 7.346 (ref 7.250–7.430)
PO2 VEN: 44 mmHg (ref 32.0–45.0)
PO2 VEN: 44 mmHg (ref 32.0–45.0)
TCO2: 26 mmol/L (ref 22–32)
TCO2: 26 mmol/L (ref 22–32)
pH, Ven: 7.344 (ref 7.250–7.430)

## 2017-09-08 SURGERY — RIGHT/LEFT HEART CATH AND CORONARY ANGIOGRAPHY
Anesthesia: LOCAL

## 2017-09-08 MED ORDER — SODIUM CHLORIDE 0.9% FLUSH: 3.0000 mL | INTRAVENOUS | Status: DC | PRN

## 2017-09-08 MED ORDER — IOPAMIDOL (ISOVUE-370) INJECTION 76%
INTRAVENOUS | Status: AC
  Filled 2017-09-08: qty 100

## 2017-09-08 MED ORDER — SODIUM CHLORIDE 0.9 % IV SOLN: 250.0000 mL | INTRAVENOUS | Status: DC | PRN

## 2017-09-08 MED ORDER — MIDAZOLAM HCL 2 MG/2ML IJ SOLN
INTRAMUSCULAR | Status: DC | PRN
  Administered 2017-09-08 (×2): 1 mg via INTRAVENOUS

## 2017-09-08 MED ORDER — SODIUM CHLORIDE 0.9% FLUSH: 3.0000 mL | Freq: Two times a day (BID) | INTRAVENOUS | Status: DC

## 2017-09-08 MED ORDER — SODIUM CHLORIDE 0.9 % WEIGHT BASED INFUSION: 1.0000 mL/kg/h | INTRAVENOUS | Status: DC

## 2017-09-08 MED ORDER — HEPARIN SODIUM (PORCINE) 1000 UNIT/ML IJ SOLN
INTRAMUSCULAR | Status: AC
  Filled 2017-09-08: qty 1

## 2017-09-08 MED ORDER — IOPAMIDOL (ISOVUE-370) INJECTION 76%
INTRAVENOUS | Status: DC | PRN
  Administered 2017-09-08: 35 mL via INTRA_ARTERIAL

## 2017-09-08 MED ORDER — ONDANSETRON HCL 4 MG/2ML IJ SOLN: 4.0000 mg | Freq: Four times a day (QID) | INTRAMUSCULAR | Status: DC | PRN

## 2017-09-08 MED ORDER — MIDAZOLAM HCL 2 MG/2ML IJ SOLN
INTRAMUSCULAR | Status: AC
  Filled 2017-09-08: qty 2

## 2017-09-08 MED ORDER — FENTANYL CITRATE (PF) 100 MCG/2ML IJ SOLN
INTRAMUSCULAR | Status: AC
Start: 2017-09-08 — End: ?
  Filled 2017-09-08: qty 2

## 2017-09-08 MED ORDER — VERAPAMIL HCL 2.5 MG/ML IV SOLN
INTRAVENOUS | Status: DC | PRN
  Administered 2017-09-08: 10 mL via INTRA_ARTERIAL

## 2017-09-08 MED ORDER — ASPIRIN 81 MG PO CHEW: 81.0000 mg | CHEWABLE_TABLET | ORAL | Status: DC

## 2017-09-08 MED ORDER — VERAPAMIL HCL 2.5 MG/ML IV SOLN
INTRAVENOUS | Status: AC
  Filled 2017-09-08: qty 2

## 2017-09-08 MED ORDER — HEPARIN SODIUM (PORCINE) 1000 UNIT/ML IJ SOLN
INTRAMUSCULAR | Status: DC | PRN
  Administered 2017-09-08: 3500 [IU] via INTRAVENOUS

## 2017-09-08 MED ORDER — ACETAMINOPHEN 325 MG PO TABS: 650.0000 mg | ORAL_TABLET | ORAL | Status: DC | PRN

## 2017-09-08 MED ORDER — LIDOCAINE HCL (PF) 1 % IJ SOLN
INTRAMUSCULAR | Status: AC
  Filled 2017-09-08: qty 30

## 2017-09-08 MED ORDER — FENTANYL CITRATE (PF) 100 MCG/2ML IJ SOLN
INTRAMUSCULAR | Status: DC | PRN
  Administered 2017-09-08 (×3): 25 ug via INTRAVENOUS

## 2017-09-08 MED ORDER — LIDOCAINE HCL (PF) 1 % IJ SOLN
INTRAMUSCULAR | Status: DC | PRN
  Administered 2017-09-08: 2 mL

## 2017-09-08 MED ORDER — HEPARIN (PORCINE) IN NACL 2-0.9 UNITS/ML
INTRAMUSCULAR | Status: AC | PRN
  Administered 2017-09-08 (×2): 500 mL

## 2017-09-08 MED ORDER — SODIUM CHLORIDE 0.9 % IV SOLN
INTRAVENOUS | Status: DC
  Administered 2017-09-08: 07:00:00 via INTRAVENOUS

## 2017-09-08 SURGICAL SUPPLY — 15 items
CATH BALLN WEDGE 5F 110CM (CATHETERS) ×1 IMPLANT
CATH INFINITI 5 FR JL3.5 (CATHETERS) ×1 IMPLANT
CATH INFINITI JR4 5F (CATHETERS) ×1 IMPLANT
DEVICE RAD TR BAND REGULAR (VASCULAR PRODUCTS) ×1 IMPLANT
GUIDEWIRE .025 260CM (WIRE) ×1 IMPLANT
GUIDEWIRE INQWIRE 1.5J.035X260 (WIRE) IMPLANT
INQWIRE 1.5J .035X260CM (WIRE) ×2
KIT HEART LEFT (KITS) ×2 IMPLANT
NDL PERC 21GX4CM (NEEDLE) IMPLANT
NEEDLE PERC 21GX4CM (NEEDLE) ×2 IMPLANT
PACK CARDIAC CATHETERIZATION (CUSTOM PROCEDURE TRAY) ×2 IMPLANT
SHEATH RAIN 4/5FR (SHEATH) ×1 IMPLANT
SHEATH RAIN RADIAL 21G 6FR (SHEATH) ×1 IMPLANT
TRANSDUCER W/STOPCOCK (MISCELLANEOUS) ×2 IMPLANT
TUBING CIL FLEX 10 FLL-RA (TUBING) ×2 IMPLANT

## 2017-09-08 NOTE — Interval H&P Note (Signed)
History and Physical Interval Note:  09/08/2017 7:52 AM  Stacy Mcintosh  has presented today for surgery, with the diagnosis of chf  The various methods of treatment have been discussed with the patient and family. After consideration of risks, benefits and other options for treatment, the patient has consented to  Procedure(s): RIGHT/LEFT HEART CATH AND CORONARY ANGIOGRAPHY (N/A) as a surgical intervention .  The patient's history has been reviewed, patient examined, no change in status, stable for surgery.  I have reviewed the patient's chart and labs.  Questions were answered to the patient's satisfaction.     Annalina Needles [employer]

## 2017-09-08 NOTE — Discharge Instructions (Signed)

## 2017-09-20 NOTE — Progress Notes (Signed)
Patient ID: Stacy Mcintosh, female   DOB: Sep 21, 1955, 62 y.o.   MRN: [id number] PCP: Deneen Harts Cardiology: Dr. Shirlee Latch  62 yo with history of hypothyroidism, nonischemic cardiomyopathy, and PE.  Hypothyroidism has been controlled by [facility] thyroid.  She is now seeing Dr. Lucianne Muss for endocrine management.  Echo in 5/17 showed EF 35-40% with diffuse hypokinesis, repeat echo in 01/2016 with improved EF of 60-65%.   In 10/17, she was found to have complete heart block and a Medtronic dual chamber PPM was placed. Last echo in 10/18 showed EF 45% with apical hypokinesis.  She had a Cardiolite in 11/18 with EF 58%, small reversible apical defect, overall low risk study. Echo in 2/19 showed EF 45-50% with apical hypokinesis.  She saw Dr. Elberta Fortis who did not recommend CRT upgrade as EF was only very mildly decreased.   She presents today for post cath follow up. Unremarkable as below. Overall she feels about the same. She continues to fatigue easliy. She is SOB walking > a block. She has occasional vomiting at night, after taking her medicines, Thinks there is a GERD component. She thinks her symptoms at large may be due to a previous diagnosis of Lyme's Disease. She is taking all medications as directed.   [facility] 09/08/17  - No angiographic CAD.  Hemodynamics RA mean 5 RV 27/8 PA 27/9, mean 17 PCWP mean 9 LV 114/12 AO 118/62 Oxygen saturations: PA 77% AO 100% Cardiac Output (Fick) 4.83  Cardiac Index (Fick) 2.91  Labs (10/10): BNP 13, K 3.7, creatinine 0.8 Labs (7/11): K 4.1, creatinine 0.6 Labs (7/12): LDL 143, HDL 74, K 3.3, creatinine 1.0, TSH normal Labs (12/12): K 4, creatinine 0.8, HDL 85, LDL 107 Labs (11/15): K 4.2, creatinine 0.74, LDL 78, HDL 76 Labs (4/17): TSH normal, K 4.5, creatinine 0.92, HCT 42.2 Labs (5/17): LDL 105, HDL 72, BNP 261 Labs (6/17): D dimer negative, BNP 64 Labs (8/17): K 4.2, creatinine 1.05, TSH 0.329 (low), K 4.2, creatinine 1.05, hgb 15.2, BNP  25 Labs (10/17): K 3.5, creatinine 1.06 Labs (9/18): K 3.6, creatinine 1.07, TSH normal Labs (2/19): K 3.6, creatinine 0.99  Allergies (verified):  1)  ! Lisinopril  Past Medical History: 1. Hypothyroidism and history of Graves disease.  She is status post radioactive iodine therapy.  She then developed hypothyroidism, and the hypothyroidism has been difficult to control.   2. Cardiomyopathy, nonischemic.  The patient had a cardiac MRI done during her hospitalization in July 2009.  This showed moderate LV enlargement and global LV hypokinesis.  There was a septal paradox pattern.  EF was calculated to be 27%.  There was mild left atrial enlargement.  The RV was normal in size and function.  There was no ASD or VSD.  There was no evidence for delayed enhancement pattern  in the LV myocardium suggesting that there was no scar from coronary artery disease and no definite evidence for infiltrative cardiomyopathy.  HIV was negative.  She did have an echo done also in the hospital in July 2009 that showed severe mitral regurgitation.  A TEE done following this showed an EF of 25-30% with severe mitral regurgitation.  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  The patient did have a left and right heart catheterization after treatment for congestive heart failure in January 2010.  There was no angiographic coronary disease.  EF had improved to 55-60% with no significant mitral  regurgitation.  The hemodynamics  were totally normal on the right heart catheterization.  The cardiomyopathy, I think, was likely due to poorly controlled hypothyroidism.   - Echo (8/10): EF 50-55%, mild LV dilatation, mild-mod MR, PASP 32 - Echo (11/15): EF 30% - Echo (5/17): EF 35-40%, mild-moderate TR, moderate MR, PASP 50 mmHg.  - Echo (10/17): EF 60-65%. Mild TR  - Echo (10/18): EF 45%, apical hypokinesis.  - Cardiolite (11/18): EF 58%, reversible apical defect (small), cannot rule out ischemia, low risk  overall.  - Echo (2/19): EF 45-50%, apical hypokinesis - [facility] (5/19): No significant coronary disease. Mean RA 5, PA 27/9, PCWP 9, CI 2.91.  3. PE/DVT.  July 2009.  The patient had been very sedentary prior to this due to her severe hypothyroidism.  She had  normal antithrombin III.  She was negative for factor V Leiden.  She had normal protein C and protein S function.  Now off coumadin.  4. Hypertension. 5. History of left bundle-branch block. 6. Asthma. 7. Migraines. 8. ACEI cough 9. Low back pain: L-spine disease.  10. Lyme disease: Treated in 12/16.  11. Complete heart block with Medtronic dual chamber PPM.  12. 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 Never smoked ETOH-no   Review of systems complete and found to be negative unless listed in HPI.   Current Outpatient Medications  Medication Sig Dispense Refill  . ALPRAZolam (XANAX) 0.5 MG tablet Take 1 tablet (0.5 mg total) by mouth 3 (three) times daily as needed for anxiety. 30 tablet 0  . aspirin EC 81 MG tablet Take 81 mg by mouth daily.     . benzonatate (TESSALON) 100 MG capsule Take 1-2 capsules (100-200 mg total) by mouth 3 (three) times daily as needed for cough. 120 capsule 0  . carvedilol (COREG) 6.25 MG tablet Take 1 tablet (6.25 mg total) by mouth 2 (two) times daily with a meal. 60 tablet 3  . clonazePAM (KLONOPIN) 1 MG tablet Take 1 mg by mouth at bedtime as needed for anxiety.     Marland Kitchen doxepin (SINEQUAN) 10 MG capsule Take 20 mg by mouth at bedtime.     . ergocalciferol (VITAMIN D2) 50000 units capsule Take 50,000 Units by mouth every Wednesday.    . furosemide (LASIX) 40 MG tablet TAKE 1 TABLET (40 MG TOTAL) BY MOUTH DAILY. MAY  TAKE AN ADDITIONAL 40MG  DAILY FOR FLUID. 60 tablet 3  . pantoprazole (PROTONIX) 40 MG tablet Take 1 tablet (40 mg total) by mouth daily. 90 tablet 3  . sacubitril-valsartan (ENTRESTO) 49-51 MG Take 1 tablet by mouth 2 (two) times daily.    . simvastatin (ZOCOR) 20 MG tablet Take 1 tablet (20 mg total) by mouth at bedtime. Office visit needed for additional refills (Patient taking differently: Take 40 mg by mouth at bedtime. Office visit needed for additional refills) 90 tablet 0  . simvastatin (ZOCOR) 40 MG tablet TAKE 1 TABLET (40 MG TOTAL) BY MOUTH NIGHTLY. VISIT NEEDED FOR FURTHER REFILLS. 30 tablet 0  . spironolactone (ALDACTONE) 25 MG tablet TAKE 1 TABLET BY MOUTH EVERY DAY 90 tablet 1  . thyroid (ARMOUR THYROID) 60 MG tablet TAKE 1 TABLET (60 MG TOTAL) BY MOUTH DAILY BEFORE BREAKFAST. 90 tablet 3  . traMADol (ULTRAM) 50 MG tablet Take 1-2 tablets (50-100  mg total) by mouth every 6 (six) hours as needed (cough). (Patient not taking: Reported on 09/06/2017) 30 tablet 0   No current facility-administered medications for this visit.     There were no vitals taken for this visit. General: NAD Neck: No JVD, no thyromegaly or thyroid nodule.  Lungs: Clear to auscultation bilaterally with normal respiratory effort. CV: Nondisplaced PMI.  Heart regular S1/S2, no S3/S4, no murmur.  No peripheral edema.  No carotid bruit.  Normal pedal pulses.  Abdomen: Soft, nontender, no hepatosplenomegaly, no distention.  Skin: Intact without lesions or rashes.  Neurologic: Alert and oriented x 3.  Psych: Normal affect. Extremities: No clubbing or cyanosis.  HEENT: Normal.    Assessment/Plan:  1. Chronic systolic CHF: Patient has a nonischemic cardiomyopathy that was thought initially to be due to uncontrolled hypothyroidism. Cardiac MRI in 2009 showed no LGE.  EF improved to 50-55% on echo in 2010. Cath in 2010 showed normal coronaries.  However, EF was back down to 30% in 11/15 and was 35-40% in 5/17.  Echo  in 01/2016 with improvement to EF 60-65%.  However, 10/18 echo showed fall in EF to 45% with apical wall motion abnormality.  Cardiolite in 11/18 showed a small area of possible apical ischemia and EF 58%. Most recent echo in 2/19 showed EF 45-50% with apical hypokinesis. She is now RV pacing 99% of the time because of PPM implanted for CHB, Dr Elberta Fortis opted against CRT upgrade as EF is only mildly depressed.  - NYHA III symptoms.  - Volume status stable on exam.   - L/RHC 09/08/17 with normal cardiac output, filling pressures, and no CAD.  - Continue Spiro 25 mg daily - Continue Entresto 49/51 bid.   - Continue Coreg 6.25 mg bid with fatigue.  - Reinforced fluid restriction to < 2 L daily, sodium restriction to less than 2000 mg daily, and the importance of daily weights.   2. Hypothyroidism:  - Per Dr. Lucianne Muss.   3. HYPERCHOLESTEROLEMIA:  - Continue simvastatin.  4. Pulmonary embolism: In 2009.  - No longer taking Coumadin.  - Continue daily ASA.  5. Complete heart block:  - Medtronic PPM, See above.  6. Depression:  - Think this plays a large role in her symptoms - She declines pharmacologic treatment. Think this plays a large role in her symptoms.  She does not want pharmacologic treatment. She has a Veterinary surgeon.     Can consider CPX to further quantify her fatigue and DOE. Otherwise, stable from cardiac perspective.    Graciella Freer, PA-C  09/20/2017  Patient seen with PA, agree with the above note. She had R/LHC in 5/19 with normal coronaries and normal filling pressures/cardiac output.  I cannot find any cardiac basis for her symptoms.  She has a mild cardiomyopathy as noted above. She actually seems to be feeling better today.  - I recommended regular exercise.  - If symptoms worsen, would be reasonable to consider CPX.  - Followup in 4-6 months.   Marca Ancona 09/21/2017

## 2017-09-21 ENCOUNTER — Ambulatory Visit (HOSPITAL_COMMUNITY)
Admission: RE | Admit: 2017-09-21 | Discharge: 2017-09-21 | Disposition: A | Discharge status: Home / Self Care | Payer: 59 | Source: Ambulatory Visit | Attending: Cardiology | Admitting: Cardiology

## 2017-09-21 ENCOUNTER — Encounter: Payer: Self-pay | Admitting: Internal Medicine

## 2017-09-21 VITALS — BP 142/86 | HR 84 | Wt 157.2 lb

## 2017-09-21 DIAGNOSIS — F329 Major depressive disorder, single episode, unspecified: Secondary | ICD-10-CM | POA: Diagnosis not present

## 2017-09-21 DIAGNOSIS — Z79890 Hormone replacement therapy (postmenopausal): Secondary | ICD-10-CM | POA: Diagnosis not present

## 2017-09-21 DIAGNOSIS — I110 Hypertensive heart disease with heart failure: Secondary | ICD-10-CM | POA: Diagnosis not present

## 2017-09-21 DIAGNOSIS — I442 Atrioventricular block, complete: Secondary | ICD-10-CM | POA: Diagnosis not present

## 2017-09-21 DIAGNOSIS — Z8349 Family history of other endocrine, nutritional and metabolic diseases: Secondary | ICD-10-CM | POA: Diagnosis not present

## 2017-09-21 DIAGNOSIS — Z8261 Family history of arthritis: Secondary | ICD-10-CM | POA: Diagnosis not present

## 2017-09-21 DIAGNOSIS — I5022 Chronic systolic (congestive) heart failure: Secondary | ICD-10-CM

## 2017-09-21 DIAGNOSIS — E7800 Pure hypercholesterolemia, unspecified: Secondary | ICD-10-CM | POA: Diagnosis not present

## 2017-09-21 DIAGNOSIS — Z86718 Personal history of other venous thrombosis and embolism: Secondary | ICD-10-CM | POA: Insufficient documentation

## 2017-09-21 DIAGNOSIS — Z79899 Other long term (current) drug therapy: Secondary | ICD-10-CM | POA: Diagnosis not present

## 2017-09-21 DIAGNOSIS — Z86711 Personal history of pulmonary embolism: Secondary | ICD-10-CM | POA: Diagnosis not present

## 2017-09-21 DIAGNOSIS — Z950 Presence of cardiac pacemaker: Secondary | ICD-10-CM | POA: Diagnosis not present

## 2017-09-21 DIAGNOSIS — R0602 Shortness of breath: Secondary | ICD-10-CM

## 2017-09-21 DIAGNOSIS — Z833 Family history of diabetes mellitus: Secondary | ICD-10-CM | POA: Insufficient documentation

## 2017-09-21 DIAGNOSIS — I428 Other cardiomyopathies: Secondary | ICD-10-CM | POA: Diagnosis not present

## 2017-09-21 DIAGNOSIS — Z7982 Long term (current) use of aspirin: Secondary | ICD-10-CM | POA: Diagnosis not present

## 2017-09-21 DIAGNOSIS — E890 Postprocedural hypothyroidism: Secondary | ICD-10-CM | POA: Insufficient documentation

## 2017-09-21 DIAGNOSIS — E039 Hypothyroidism, unspecified: Secondary | ICD-10-CM | POA: Diagnosis present

## 2017-09-21 DIAGNOSIS — Z8249 Family history of ischemic heart disease and other diseases of the circulatory system: Secondary | ICD-10-CM | POA: Insufficient documentation

## 2017-09-21 NOTE — Patient Instructions (Signed)
Your physician recommends that you schedule a follow-up appointment in: 4-6 months with Dr Shirlee Latch  Do the following things EVERYDAY: 1) Weigh yourself in the morning before breakfast. Write it down and keep it in a log. 2) Take your medicines as prescribed 3) Eat low salt foods-Limit salt (sodium) to 2000 mg per day.  4) Stay as active as you can everyday 5) Limit all fluids for the day to less than 2 liters

## 2017-10-04 ENCOUNTER — Telehealth (HOSPITAL_COMMUNITY): Payer: Self-pay | Admitting: *Deleted

## 2017-10-04 NOTE — Telephone Encounter (Signed)
Entresto PA is covered by patient's plan, no PA required.

## 2017-10-09 ENCOUNTER — Other Ambulatory Visit (HOSPITAL_COMMUNITY): Payer: Self-pay | Admitting: Cardiology

## 2017-10-16 ENCOUNTER — Other Ambulatory Visit: Payer: Self-pay | Admitting: Cardiology

## 2017-11-15 ENCOUNTER — Other Ambulatory Visit (HOSPITAL_COMMUNITY): Payer: Self-pay | Admitting: Cardiology

## 2017-11-25 ENCOUNTER — Other Ambulatory Visit (HOSPITAL_COMMUNITY): Payer: Self-pay | Admitting: Cardiology

## 2017-12-18 ENCOUNTER — Other Ambulatory Visit (HOSPITAL_COMMUNITY): Payer: Self-pay | Admitting: Cardiology

## 2017-12-26 ENCOUNTER — Telehealth: Payer: Self-pay | Admitting: Cardiology

## 2017-12-26 ENCOUNTER — Ambulatory Visit (INDEPENDENT_AMBULATORY_CARE_PROVIDER_SITE_OTHER): Payer: 59 | Admitting: *Deleted

## 2017-12-26 DIAGNOSIS — I442 Atrioventricular block, complete: Secondary | ICD-10-CM

## 2017-12-26 NOTE — Telephone Encounter (Signed)
LMOVM reminding pt to send remote transmission.   

## 2017-12-27 NOTE — Progress Notes (Signed)
Remote pacemaker transmission.   

## 2018-01-17 LAB — CUP PACEART REMOTE DEVICE CHECK
Battery Remaining Longevity: 88 mo
Brady Statistic AP VS Percent: 0 %
Brady Statistic AS VP Percent: 90.64 %
Brady Statistic AS VS Percent: 1.88 %
Brady Statistic RA Percent Paced: 7.34 %
Date Time Interrogation Session: 20190904180808
Implantable Lead Location: 753859
Lead Channel Impedance Value: 399 Ohm
Lead Channel Impedance Value: 456 Ohm
Lead Channel Pacing Threshold Amplitude: 0.75 V
Lead Channel Pacing Threshold Pulse Width: 0.4 ms
Lead Channel Pacing Threshold Pulse Width: 0.4 ms
Lead Channel Sensing Intrinsic Amplitude: 17.75 mV
Lead Channel Sensing Intrinsic Amplitude: 17.75 mV
Lead Channel Sensing Intrinsic Amplitude: 2.625 mV
Lead Channel Sensing Intrinsic Amplitude: 2.625 mV
Lead Channel Setting Pacing Amplitude: 2.5 V
MDC IDC LEAD IMPLANT DT: 20171024
MDC IDC LEAD IMPLANT DT: 20171024
MDC IDC LEAD LOCATION: 753860
MDC IDC MSMT BATTERY VOLTAGE: 3.01 V
MDC IDC MSMT LEADCHNL RA PACING THRESHOLD AMPLITUDE: 0.5 V
MDC IDC MSMT LEADCHNL RV IMPEDANCE VALUE: 456 Ohm
MDC IDC MSMT LEADCHNL RV IMPEDANCE VALUE: 513 Ohm
MDC IDC PG IMPLANT DT: 20171024
MDC IDC SET LEADCHNL RA PACING AMPLITUDE: 2 V
MDC IDC SET LEADCHNL RV PACING PULSEWIDTH: 0.4 ms
MDC IDC SET LEADCHNL RV SENSING SENSITIVITY: 2 mV
MDC IDC STAT BRADY AP VP PERCENT: 7.47 %
MDC IDC STAT BRADY RV PERCENT PACED: 98.06 %

## 2018-02-05 ENCOUNTER — Telehealth (HOSPITAL_COMMUNITY): Payer: Self-pay | Admitting: *Deleted

## 2018-02-05 NOTE — Telephone Encounter (Signed)
Patient called c/o being more fatigued, increased shortness of breath, and noticeable swelling. Pt wants to be seen in clinic and stated she didn't mind seeing a PA. Per Dr.McLean add her to the APP clinic this week. Patient aware and agreeable.

## 2018-02-07 ENCOUNTER — Encounter (HOSPITAL_COMMUNITY): Payer: 59

## 2018-02-07 DIAGNOSIS — K219 Gastro-esophageal reflux disease without esophagitis: Secondary | ICD-10-CM | POA: Insufficient documentation

## 2018-02-07 DIAGNOSIS — F331 Major depressive disorder, recurrent, moderate: Secondary | ICD-10-CM | POA: Insufficient documentation

## 2018-02-08 DIAGNOSIS — N182 Chronic kidney disease, stage 2 (mild): Secondary | ICD-10-CM | POA: Insufficient documentation

## 2018-02-26 NOTE — Progress Notes (Signed)
Cardiology Office Note Date:  03/01/2018  Patient ID:  Stacy Mcintosh, DOB [birth year], MRN [id number] PCP:  Patient, No Pcp Per  Cardiologist:  Dr. Shirlee Latch Electrophysiologist: Dr. Elberta Fortis    Chief Complaint: routine EP visit  History of Present Illness: Stacy Mcintosh is a 62 y.o. female with history of NICM, chronic CHF (systolic), DVT/PE, 2009, hypothyroidism (severe, follows with Dr. Lucianne Muss), HTN, LBBB, asthma, depression, CHB w/PPM.  The patient comes today to be seen for Dr. Elberta Fortis, last seen by him in Feb 2019, at that visit c/o waxing waning generalized fatigue.  No changes were made to her tx, not felt candidate for CRT given minimally depressed LVEF.  RV pacing 99%. More recently saw Dr. Shirlee Latch in May 2019, discussed Dr. Elberta Fortis did not recommend upgrade of device with only mild reduction in LVEF, felt depression played a role in symptomatology though pt declined Rx tx, seeing a counselor.  Recommended regular exercise and consideration for CPX if worsening symptoms  She is accompanied by her husband today.  She asks about getting the 3rd wire if she needs it and admits while she has other medical issues that can be contributing to her fatigue/weakness, she worries it may be her heart.  Generally she never feels like she has any energy, poor exertional tolerance, her husband states has to rest only after a few feet though not described as new.  She does say that in the last 2 weeks has needed to add another pillow and prop up more to sleep well.  No CP or palpitations.  She will feel occassionally lightheaded, her husband reports unsteady gait that they have been attributing to Lyme's.  (this apparently a contended diagnosis between her and her neurologist, she states she tested + 4 of 5 tests but did not qualify for formal diagnosis) No syncope. She also knows her thyroid condition contributes to her fatigue.  She has had a somewhat productive cough for a few weeks, no fever or symptoms of  illness otherwise.    Device information: MDT dual chamber PPM, implanted 02/16/16  Past Medical History:  Diagnosis Date  . Anxiety   . Arthritis    "lower back" (02/16/2016)  . BUNDLE BRANCH BLOCK, LEFT 11/12/2008  . CARDIOMYOPATHY 11/12/2008  . CHF (congestive heart failure) (HCC)    a.Echo 5/17: EF 35-40%, moderate MR, mild LAE, mild to moderate TR, PASP 50 mmHg  . Chronic lower back pain   . Depression   . DVT (deep venous thrombosis) (HCC) 2008   LLE  . Graves' disease    S/P radiation; "now up/down"  . HYPERTENSION 10/16/2007  . Hypothyroidism    h/o Graves Disease  . Migraine    "controlled w/piercings" (02/16/2016)  . Other and unspecified hyperlipidemia 01/19/2010  . PE (pulmonary embolism) 2008   Factor V negative  . Presence of permanent cardiac pacemaker   . Pure hypercholesterolemia 11/18/2008    Past Surgical History:  Procedure Laterality Date  . ABDOMINAL HYSTERECTOMY  1990s  . CARDIAC CATHETERIZATION  2009?  . CESAREAN SECTION  1974; 198j0; 1987   x3  . EP IMPLANTABLE DEVICE N/A 02/16/2016   Procedure: Pacemaker Implant;  Surgeon: Will Jorja Loa, MD;  Location: [facility];  Service: Cardiovascular;  Laterality: N/A;  . HERNIA REPAIR  1956  . INSERT / REPLACE / REMOVE PACEMAKER    . RIGHT/LEFT HEART CATH AND CORONARY ANGIOGRAPHY N/A 09/08/2017   Procedure: RIGHT/LEFT HEART CATH AND CORONARY ANGIOGRAPHY;  Surgeon: Laurey Morale,  MD;  Location: [facility];  Service: Cardiovascular;  Laterality: N/A;    Current Outpatient Medications  Medication Sig Dispense Refill  . ALPRAZolam (XANAX) 0.5 MG tablet Take 1 tablet (0.5 mg total) by mouth 3 (three) times daily as needed for anxiety. 30 tablet 0  . aspirin EC 81 MG tablet Take 81 mg by mouth daily.     . benzonatate (TESSALON) 100 MG capsule Take 1-2 capsules (100-200 mg total) by mouth 3 (three) times daily as needed for cough. 120 capsule 0  . carvedilol (COREG) 6.25 MG tablet Take 1  tablet (6.25 mg total) by mouth 2 (two) times daily with a meal. 60 tablet 3  . clonazePAM (KLONOPIN) 1 MG tablet Take 1 mg by mouth at bedtime as needed for anxiety.     Marland Kitchen doxepin (SINEQUAN) 10 MG capsule Take 20 mg by mouth at bedtime.     Marland Kitchen ENTRESTO 49-51 MG TAKE 1 TABLET BY MOUTH TWICE A DAY 180 tablet 1  . furosemide (LASIX) 40 MG tablet TAKE 1 TABLET (40 MG TOTAL) BY MOUTH DAILY. MAY TAKE AN ADDITIONAL 40MG  DAILY FOR FLUID. 60 tablet 3  . pantoprazole (PROTONIX) 40 MG tablet Take 1 tablet (40 mg total) by mouth daily. 90 tablet 3  . sacubitril-valsartan (ENTRESTO) 49-51 MG Take 1 tablet by mouth 2 (two) times daily.    . simvastatin (ZOCOR) 20 MG tablet Take 1 tablet (20 mg total) by mouth at bedtime. Office visit needed for additional refills (Patient taking differently: Take 40 mg by mouth at bedtime. Office visit needed for additional refills) 90 tablet 0  . spironolactone (ALDACTONE) 25 MG tablet TAKE 1 TABLET BY MOUTH EVERY DAY 90 tablet 3  . thyroid (ARMOUR THYROID) 60 MG tablet TAKE 1 TABLET (60 MG TOTAL) BY MOUTH DAILY BEFORE BREAKFAST. 90 tablet 3  . tiZANidine (ZANAFLEX) 4 MG tablet Take 4 mg by mouth 3 (three) times daily as needed.  6  . traMADol (ULTRAM) 50 MG tablet Take 1-2 tablets (50-100 mg total) by mouth every 6 (six) hours as needed (cough). 30 tablet 0   No current facility-administered medications for this visit.     Allergies:   Augmentin [amoxicillin-pot clavulanate]; Lisinopril; and Prednisone   Social History:  The patient  reports that she has never smoked. She has never used smokeless tobacco. She reports that she drinks alcohol. She reports that she does not use drugs.   Family History:  The patient's family history includes Arthritis in her sister; Diabetes in her brother; Heart attack in her brother and mother; Hypertension in her mother and sister; Ovarian cancer in her mother; Prostate cancer in her father; Rheumatologic disease in her mother; Sudden death  in her maternal grandfather; Thyroid disease in her brother.  ROS:  Please see the history of present illness.  All other systems are reviewed and otherwise negative.   PHYSICAL EXAM:  VS:  BP 118/68   Pulse 82   Ht 5' (1.524 m)   Wt 164 lb (74.4 kg)   SpO2 98%   BMI 32.03 kg/m  BMI: Body mass index is 32.03 kg/m. Well nourished, well developed, in no acute distress  HEENT: normocephalic, atraumatic  Neck: no JVD, carotid bruits or masses Cardiac:   RRR; no significant murmurs, no rubs, or gallops Lungs:  CTA b/l, no wheezing, rhonchi or rales  Abd: soft, nontender MS: no deformity or  atrophy Ext: no edema  Skin: warm and dry, no rash Neuro:  No  gross deficits appreciated Psych: euthymic mood, full affect  PPM site is stable, no tethering or discomfort   EKG:  Not done today PPM interrogation done today and reviewed by myself: battery and lead measurements are good, 4 NSVT EGM reviewed is 1:1 AT brief, AF epiosdes EGMs are far field, no true VT or AF.   [facility] 09/08/17  - No angiographic CAD.  Hemodynamics RA mean 5 RV 27/8 PA 27/9, mean 17 PCWP mean 9 LV 114/12 AO 118/62 Oxygen saturations: PA 77% AO 100% Cardiac Output (Fick) 4.83  Cardiac Index (Fick) 2.91   TTE 06/07/17 Review of the above records today demonstrates:  - Left ventricle: Abnormal septal motion apical hypokinesis The cavity size was normal. Wall thickness was increased in a pattern of mild LVH. Systolic function was mildly reduced. The estimated ejection fraction was in the range of 45% to 50%. Doppler parameters are consistent with abnormal left ventricular relaxation (grade 1 diastolic dysfunction). - Atrial septum: No defect or patent foramen ovale was identified. - Pulmonary arteries: PA peak pressure: 31 mm Hg (S).   Myoview 03/06/17  Nuclear stress EF: 58% with apical akinesis.  There was no ST segment deviation noted during stress.  There is a small defect of mild  severity present in the apex location. The defect is non-reversible and consistent with infarct. No ischemia noted.  This is a low risk study.  The left ventricular ejection fraction is normal (55-65%).    Recent Labs: 09/04/2017: B Natriuretic Peptide 12.7; BUN 14; Creatinine, Ser 1.21; Hemoglobin 14.6; Platelets 364; Potassium 4.1; Sodium 138; TSH 9.708  No results found for requested labs within last 8760 hours.   CrCl cannot be calculated (Patient's most recent lab result is older than the maximum 21 days allowed.).   Wt Readings from Last 3 Encounters:  03/01/18 164 lb (74.4 kg)  09/21/17 157 lb 3.2 oz (71.3 kg)  09/08/17 (P) 157 lb (71.2 kg)     Other studies reviewed: Additional studies/records reviewed today include: summarized above  ASSESSMENT AND PLAN:  1. PPM     intact function, 98.4% VP  2. NICM, LVEF 45-50% 3. CHF     On BB/ARB (Entresto), aldactone     Weight is up, she describes a cough with some PND symptoms     C/w Dr. Shirlee Latch Exam does not suggest overt fluid OL, though she does have symptoms Increase lasix to BID for 3 days, BMET 1 week  afterwards She is concerned perhaps of worsening heart function and possible need for the 3rd wire. Will revisit her echo/LVEF.  She is pacer dependent, and RV pacing nearly all of the time, ? If EF is declining?  4. HTN     Looks ok    Disposition: F/u with BMET and echo, will plan to see her next month if needed pending the echo findings.  Current medicines are reviewed at length with the patient today.  The patient did not have any concerns regarding medicines.  Norma Fredrickson, PA-C 03/01/2018 10:25 AM     [facility] [address] [place] [address] [phone] (office)  [phone] (fax)

## 2018-03-01 ENCOUNTER — Ambulatory Visit (INDEPENDENT_AMBULATORY_CARE_PROVIDER_SITE_OTHER): Payer: 59 | Admitting: Physician Assistant

## 2018-03-01 VITALS — BP 118/68 | HR 82 | Ht 60.0 in | Wt 164.0 lb

## 2018-03-01 DIAGNOSIS — I5022 Chronic systolic (congestive) heart failure: Secondary | ICD-10-CM | POA: Diagnosis not present

## 2018-03-01 DIAGNOSIS — I428 Other cardiomyopathies: Secondary | ICD-10-CM | POA: Diagnosis not present

## 2018-03-01 DIAGNOSIS — I10 Essential (primary) hypertension: Secondary | ICD-10-CM | POA: Diagnosis not present

## 2018-03-01 DIAGNOSIS — Z950 Presence of cardiac pacemaker: Secondary | ICD-10-CM

## 2018-03-01 NOTE — Patient Instructions (Addendum)
Medication Instructions:   TAKE  LASIX 40 MG THREE TIMES A DAY FOR TWO DAYS   THEN RESUME BACK TO LASIX 40 MG ONCE DAY   If you need a refill on your cardiac medications before your next appointment, please call your pharmacy.   Lab work: BMET  ON  SAME DAY AS ECHO    If you have labs (blood work) drawn today and your tests are completely normal, you will receive your results only by: Marland Kitchen MyChart Message (if you have MyChart) OR . A paper copy in the mail If you have any lab test that is abnormal or we need to change your treatment, we will call you to review the results.   Testing/Procedures:Your physician has requested that you have an echocardiogram. Echocardiography is a painless test that uses sound waves to create images of your heart. It provides your doctor with information about the size and shape of your heart and how well your heart's chambers and valves are working. This procedure takes approximately one hour. There are no restrictions for this procedure.     Follow-Up:  IN ONE MONTH  WITH URSUY ON DAY DR CAMNITZ IN OFFICE     Remote monitoring is used to monitor your Pacemaker of ICD from home. This monitoring reduces the number of office visits required to check your device to one time per year. It allows [place] to keep an eye on the functioning of your device to ensure it is working properly. You are scheduled for a device check from home on . 06-28-2018 You may send your transmission at any time that day. If you have a wireless device, the transmission will be sent automatically. After your physician reviews your transmission, you will receive a postcard with your next transmission date.       Any Other Special Instructions Will Be Listed Below (If Applicable).

## 2018-03-08 ENCOUNTER — Other Ambulatory Visit: Payer: Self-pay

## 2018-03-08 ENCOUNTER — Ambulatory Visit (HOSPITAL_COMMUNITY): Payer: 59 | Attending: Internal Medicine

## 2018-03-08 ENCOUNTER — Other Ambulatory Visit: Payer: 59

## 2018-03-08 DIAGNOSIS — I428 Other cardiomyopathies: Secondary | ICD-10-CM | POA: Diagnosis not present

## 2018-03-09 LAB — BASIC METABOLIC PANEL
BUN/Creatinine Ratio: 10 — ABNORMAL LOW (ref 12–28)
BUN: 11 mg/dL (ref 8–27)
CO2: 25 mmol/L (ref 20–29)
CREATININE: 1.12 mg/dL — AB (ref 0.57–1.00)
Calcium: 10.2 mg/dL (ref 8.7–10.3)
Chloride: 96 mmol/L (ref 96–106)
GFR calc Af Amer: 60 mL/min/{1.73_m2} (ref >59)
GFR, EST NON AFRICAN AMERICAN: 52 mL/min/{1.73_m2} — AB (ref >59)
Glucose: 96 mg/dL (ref 65–99)
Potassium: 4.2 mmol/L (ref 3.5–5.2)
SODIUM: 140 mmol/L (ref 134–144)

## 2018-03-28 ENCOUNTER — Telehealth: Payer: Self-pay | Admitting: *Deleted

## 2018-03-28 NOTE — Telephone Encounter (Signed)
LMOVM  OF NORMAL RESULTS AND CLINIC NUMBER IF HAVE ANY QUESTIONS.

## 2018-04-02 ENCOUNTER — Other Ambulatory Visit (HOSPITAL_COMMUNITY): Payer: Self-pay | Admitting: Cardiology

## 2018-04-20 ENCOUNTER — Telehealth (HOSPITAL_COMMUNITY): Payer: Self-pay | Admitting: Professional

## 2018-04-20 NOTE — Telephone Encounter (Signed)
Cln returned pt call. Cln oriented pt to PHP. Pt reports interest in an ax. Pt reports she is struggling with depression symptoms and "feeling really low." Pt shares she sees Dr. Westley Chandler who has recommended PHP "many times and I have put it off too long." Pt has an appt with Dr. Westley Chandler this afternoon. Pt denies SI currently "but I have had it in the past." Pt reports some HI towards ex-boyfriend but denies intent or plan. Pt states "I'm not sure what I need at this time. I feel like I just need to get away for a while to reset." Cln and pt discussed going to ED if needed; pt asked if she would stay at [facility] if went to [facility] ED and cln explained she would get a psych eval and then be placed inpt at a local facility with a bed if that was determined to be necessary. Pt understood. Pt asks about cost of ax. Cln reported pt would not be charged at time of ax, insurance would be billed first then pt would get remaining balance later. Cln told pt this is also how billing is done for group. Cln encouraged pt to call her insurance [facility]) to check on her benefits for PHP. Pt agreed to do so. Pt is scheduled for an ax on Monday, 12/30 @ 2:30. Pt told to arrive at 1:30 for pw. Pt provided with address and direct number for PHP [phone]). Pt provided with crisis hotline number [phone]).

## 2018-04-23 ENCOUNTER — Other Ambulatory Visit (HOSPITAL_COMMUNITY): Payer: 59 | Attending: Psychiatry | Admitting: Licensed Clinical Social Worker

## 2018-04-23 DIAGNOSIS — I442 Atrioventricular block, complete: Secondary | ICD-10-CM | POA: Insufficient documentation

## 2018-04-23 DIAGNOSIS — I5022 Chronic systolic (congestive) heart failure: Secondary | ICD-10-CM | POA: Insufficient documentation

## 2018-04-23 DIAGNOSIS — F332 Major depressive disorder, recurrent severe without psychotic features: Secondary | ICD-10-CM

## 2018-04-23 DIAGNOSIS — I110 Hypertensive heart disease with heart failure: Secondary | ICD-10-CM | POA: Diagnosis not present

## 2018-04-23 DIAGNOSIS — I428 Other cardiomyopathies: Secondary | ICD-10-CM | POA: Diagnosis not present

## 2018-04-23 DIAGNOSIS — I447 Left bundle-branch block, unspecified: Secondary | ICD-10-CM | POA: Diagnosis not present
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
## 2018-06-13 ENCOUNTER — Other Ambulatory Visit (HOSPITAL_COMMUNITY): Payer: Self-pay | Admitting: Cardiology

## 2018-06-13 NOTE — Telephone Encounter (Signed)
Pt needs appointment for future refills, [phone]

## 2018-06-26 ENCOUNTER — Other Ambulatory Visit: Payer: Self-pay | Admitting: Physician Assistant

## 2018-06-26 ENCOUNTER — Ambulatory Visit
Admission: RE | Admit: 2018-06-26 | Discharge: 2018-06-26 | Disposition: A | Discharge status: Home / Self Care | Payer: 59 | Source: Ambulatory Visit | Attending: Physician Assistant | Admitting: Physician Assistant

## 2018-06-26 ENCOUNTER — Other Ambulatory Visit (HOSPITAL_COMMUNITY): Payer: Self-pay | Admitting: Cardiology

## 2018-06-26 DIAGNOSIS — R059 Cough, unspecified: Secondary | ICD-10-CM

## 2018-06-26 DIAGNOSIS — R05 Cough: Secondary | ICD-10-CM

## 2018-06-28 ENCOUNTER — Encounter: Payer: 59 | Admitting: *Deleted

## 2018-06-28 ENCOUNTER — Encounter (HOSPITAL_BASED_OUTPATIENT_CLINIC_OR_DEPARTMENT_OTHER): Payer: Self-pay | Admitting: Emergency Medicine

## 2018-06-28 ENCOUNTER — Emergency Department (HOSPITAL_BASED_OUTPATIENT_CLINIC_OR_DEPARTMENT_OTHER)
Admission: EM | Admit: 2018-06-28 | Discharge: 2018-06-28 | Disposition: A | Discharge status: Home / Self Care | Payer: 59 | Attending: Emergency Medicine | Admitting: Emergency Medicine

## 2018-06-28 ENCOUNTER — Other Ambulatory Visit: Payer: Self-pay

## 2018-06-28 DIAGNOSIS — Z7982 Long term (current) use of aspirin: Secondary | ICD-10-CM | POA: Insufficient documentation

## 2018-06-28 DIAGNOSIS — Z79899 Other long term (current) drug therapy: Secondary | ICD-10-CM | POA: Diagnosis not present

## 2018-06-28 DIAGNOSIS — E039 Hypothyroidism, unspecified: Secondary | ICD-10-CM | POA: Insufficient documentation

## 2018-06-28 DIAGNOSIS — R059 Cough, unspecified: Secondary | ICD-10-CM

## 2018-06-28 DIAGNOSIS — I5022 Chronic systolic (congestive) heart failure: Secondary | ICD-10-CM | POA: Insufficient documentation

## 2018-06-28 DIAGNOSIS — I110 Hypertensive heart disease with heart failure: Secondary | ICD-10-CM | POA: Diagnosis not present

## 2018-06-28 DIAGNOSIS — R05 Cough: Secondary | ICD-10-CM | POA: Insufficient documentation

## 2018-06-28 MED ORDER — ALBUTEROL SULFATE (2.5 MG/3ML) 0.083% IN NEBU
INHALATION_SOLUTION | RESPIRATORY_TRACT | Status: AC
  Administered 2018-06-28: 2.5 mg
  Filled 2018-06-28: qty 3

## 2018-06-28 MED ORDER — PROMETHAZINE-DM 6.25-15 MG/5ML PO SYRP: 5.0000 mL | ORAL_SOLUTION | Freq: Four times a day (QID) | ORAL | 0 refills | Status: DC | PRN

## 2018-06-28 MED ORDER — AEROCHAMBER PLUS FLO-VU MEDIUM MISC
1.0000 | Freq: Once | Status: AC
  Administered 2018-06-28: 1
  Filled 2018-06-28: qty 1

## 2018-06-28 MED ORDER — IPRATROPIUM-ALBUTEROL 0.5-2.5 (3) MG/3ML IN SOLN
3.0000 mL | Freq: Once | RESPIRATORY_TRACT | Status: AC
  Administered 2018-06-28: 3 mL via RESPIRATORY_TRACT
  Filled 2018-06-28: qty 3

## 2018-06-28 MED ORDER — IPRATROPIUM-ALBUTEROL 0.5-2.5 (3) MG/3ML IN SOLN
RESPIRATORY_TRACT | Status: AC
  Administered 2018-06-28: 3 mL
  Filled 2018-06-28: qty 3

## 2018-06-28 MED ORDER — ALBUTEROL SULFATE HFA 108 (90 BASE) MCG/ACT IN AERS
2.0000 | INHALATION_SPRAY | RESPIRATORY_TRACT | Status: DC | PRN
  Administered 2018-06-28: 2 via RESPIRATORY_TRACT
  Filled 2018-06-28: qty 6.7

## 2018-06-28 MED ORDER — DEXAMETHASONE 10 MG/ML FOR PEDIATRIC ORAL USE
10.0000 mg | Freq: Once | INTRAMUSCULAR | Status: AC
  Administered 2018-06-28: 10 mg via ORAL
  Filled 2018-06-28: qty 1

## 2018-06-28 NOTE — ED Notes (Signed)
NAD at this time. Pt is stable and going home,.  

## 2018-06-28 NOTE — ED Triage Notes (Signed)
Cough x 4 days. Treated with abx, neg cxr.

## 2018-06-28 NOTE — ED Provider Notes (Signed)
[facility] EMERGENCY DEPARTMENT Provider Note   CSN: [id number] Arrival date & time: 06/28/18  1113    History   Chief Complaint Chief Complaint  Patient presents with  . Cough    HPI Stacy Mcintosh is a 63 y.o. female presenting for persistent cough. PMH notable for LBBB, anxiety, HFrEF with EF 45-50% and G1DD in 02/2018, HTN, PE, and cardiac pacemaker. She was seen by her PCP on 3/2 for cough and wheezing for which she was prescribed albuterol, azithromycin and tessalon perles, and CXR was obtained at that time, found to be WNL.  Today, patient reports worsening cough productive of sputum for the past 4 days. She sometimes coughs so severely that it induces vomiting. Associated symptoms include chest tenderness, sore throat, rhinorrhea and congestion. She has had diarrhea with the azithromycin and so discontinued this medication after two doses. She did not know that albuterol was prescribed for her and did not pick this medication up.  She did try the [facility], however reports that this did not improve her symptoms.  She denies ear pain. She denies history of COPD or asthma, has been prescribed albuterol in the distant past. She reports that she has been recommended to start a controller medication with Spiriva in the past, however she was not agreeable to starting this medication.     HPI  Past Medical History:  Diagnosis Date  . Anxiety   . Arthritis    "lower back" (02/16/2016)  . BUNDLE BRANCH BLOCK, LEFT 11/12/2008  . CARDIOMYOPATHY 11/12/2008  . CHF (congestive heart failure) (HCC)    a.Echo 5/17: EF 35-40%, moderate MR, mild LAE, mild to moderate TR, PASP 50 mmHg  . Chronic lower back pain   . Depression   . DVT (deep venous thrombosis) (HCC) 2008   LLE  . Graves' disease    S/P radiation; "now up/down"  . HYPERTENSION 10/16/2007  . Hypothyroidism    h/o Graves Disease  . Migraine    "controlled w/piercings" (02/16/2016)  . Other and unspecified  hyperlipidemia 01/19/2010  . PE (pulmonary embolism) 2008   Factor V negative  . Presence of permanent cardiac pacemaker   . Pure hypercholesterolemia 11/18/2008    Patient Active Problem List   Diagnosis Date Noted  . Complete heart block (HCC) 02/16/2016  . Cough 10/01/2015  . Chronic systolic CHF (congestive heart failure) (HCC) 09/18/2015  . Postmenopausal 08/11/2015  . History of Lyme disease 08/11/2015  . Nonischemic cardiomyopathy (HCC) 03/04/2014  . Depression 02/27/2014  . 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 History of pulmonary embolism 01/25/2011  . Hypothyroidism 10/28/2010  . OTHER AND UNSPECIFIED HYPERLIPIDEMIA 01/19/2010  . PURE HYPERCHOLESTEROLEMIA 11/18/2008  . CARDIOMYOPATHY 11/12/2008  . LBBB (left bundle branch block) 11/12/2008  . Essential hypertension 10/16/2007  . HEADACHE, CHRONIC 10/16/2007    Past Surgical History:  Procedure Laterality Date  . ABDOMINAL HYSTERECTOMY  1990s  . CARDIAC CATHETERIZATION  2009?  . CESAREAN SECTION  1974; 198j0; 1987   x3  . EP IMPLANTABLE DEVICE N/A 02/16/2016   Procedure: Pacemaker Implant;  Surgeon: Will Jorja Loa, MD;  Location: [facility] INVASIVE CV LAB;  Service: Cardiovascular;  Laterality: N/A;  . HERNIA REPAIR  1956  . INSERT / REPLACE / REMOVE PACEMAKER    . RIGHT/LEFT HEART CATH AND CORONARY ANGIOGRAPHY N/A 09/08/2017   Procedure: RIGHT/LEFT HEART CATH AND CORONARY ANGIOGRAPHY;  Surgeon: Laurey Morale, MD;  Location: [facility] INVASIVE CV LAB;  Service: Cardiovascular;  Laterality: N/A;  OB History   No obstetric history on file.      Home Medications    Prior to Admission medications   Medication Sig Start Date End Date Taking? Authorizing Provider  ALPRAZolam Prudy Feeler) 0.5 MG tablet Take 1 tablet (0.5 mg total) by mouth 3 (three) times daily as needed for anxiety. 12/18/15   Laurey Morale, MD  aspirin EC 81 MG tablet Take 81 mg by mouth daily.     [provider]  benzonatate (TESSALON)  100 MG capsule Take 1-2 capsules (100-200 mg total) by mouth 3 (three) times daily as needed for cough. 05/30/17   Porfirio Oar, PA  carvedilol (COREG) 6.25 MG tablet Take 1 tablet (6.25 mg total) by mouth 2 (two) times daily with a meal. NEED APPOINTMENT FOR FUTURE REFILLS [phone] 06/26/18   Laurey Morale, MD  clonazePAM (KLONOPIN) 1 MG tablet Take 1 mg by mouth at bedtime as needed for anxiety.  10/12/10   [provider]  doxepin (SINEQUAN) 10 MG capsule Take 20 mg by mouth at bedtime.     [provider]  ENTRESTO 49-51 MG TAKE 1 TABLET BY MOUTH TWICE A DAY 06/13/18   Laurey Morale, MD  furosemide (LASIX) 40 MG tablet TAKE 1 TABLET (40 MG TOTAL) BY MOUTH DAILY. MAY TAKE AN ADDITIONAL 40MG  DAILY FOR FLUID. 11/27/17   Laurey Morale, MD  pantoprazole (PROTONIX) 40 MG tablet Take 1 tablet (40 mg total) by mouth daily. 05/30/17   Porfirio Oar, PA  promethazine-dextromethorphan (PROMETHAZINE-DM) 6.25-15 MG/5ML syrup Take 5 mLs by mouth 4 (four) times daily as needed for cough. 06/28/18   Howard Pouch, MD  sacubitril-valsartan (ENTRESTO) 49-51 MG Take 1 tablet by mouth 2 (two) times daily.    [provider]  simvastatin (ZOCOR) 20 MG tablet Take 1 tablet (20 mg total) by mouth at bedtime. Office visit needed for additional refills Patient taking differently: Take 40 mg by mouth at bedtime. Office visit needed for additional refills 11/08/16   Porfirio Oar, PA  spironolactone (ALDACTONE) 25 MG tablet TAKE 1 TABLET BY MOUTH EVERY DAY 10/16/17   Laurey Morale, MD  thyroid [facility] THYROID) 60 MG tablet TAKE 1 TABLET (60 MG TOTAL) BY MOUTH DAILY BEFORE BREAKFAST. 04/14/17   Porfirio Oar, PA  tiZANidine (ZANAFLEX) 4 MG tablet Take 4 mg by mouth 3 (three) times daily as needed. 02/12/18   [provider]  traMADol (ULTRAM) 50 MG tablet Take 1-2 tablets (50-100 mg total) by mouth every 6 (six) hours as needed (cough). 05/30/17   Porfirio Oar, PA    Family  History Family History  Problem Relation Age of Onset  . Heart attack Mother   . Hypertension Mother   . Rheumatologic disease Mother   . Ovarian cancer Mother   . Prostate cancer Father   . Arthritis Sister   . Hypertension Sister   . Thyroid disease Brother        Parathyroid problem  . Diabetes Brother   . Heart attack Brother   . Sudden death Maternal Grandfather     Social History Social History   Tobacco Use  . Smoking status: Never Smoker  . Smokeless tobacco: Never Used  Substance Use Topics  . Alcohol use: Yes    Alcohol/week: 0.0 standard drinks    Comment: 02/16/2016 "glass of wine a couple days/month"  . Drug use: No     Allergies   Augmentin [amoxicillin-pot clavulanate]; Lisinopril; and Prednisone   Review of Systems  Review of Systems  Constitutional: Negative for chills and diaphoresis.  HENT: Positive for congestion, rhinorrhea and sore throat. Negative for ear pain, sneezing and trouble swallowing.   Respiratory: Positive for cough and wheezing. Negative for chest tightness and shortness of breath.   Cardiovascular: Negative for chest pain and palpitations.  Gastrointestinal: Positive for diarrhea and vomiting. Negative for abdominal distention, constipation and nausea.  Genitourinary: Negative for dysuria.  Skin: Negative for rash.     Physical Exam Updated Vital Signs BP (!) 171/99 (BP Location: Right Arm)   Pulse 95   Temp 100.2 F (37.9 C) (Oral)   Resp (!) 24   Ht 5' (1.524 m)   Wt 81.6 kg   SpO2 96%   BMI 35.15 kg/m   Physical Exam Constitutional:      General: She is not in acute distress.    Appearance: She is not toxic-appearing.  HENT:     Head: Normocephalic and atraumatic.     Right Ear: Tympanic membrane normal.     Left Ear: Tympanic membrane normal.     Nose: Nose normal.     Mouth/Throat:     Mouth: Mucous membranes are moist.  Cardiovascular:     Rate and Rhythm: Normal rate and regular rhythm.     Pulses:  Normal pulses.     Heart sounds: No murmur. No friction rub. No gallop.   Pulmonary:     Breath sounds: Wheezing present.  Chest:     Chest wall: Tenderness present.  Abdominal:     Palpations: Abdomen is soft.     Tenderness: There is no abdominal tenderness.  Skin:    General: Skin is warm and dry.     Capillary Refill: Capillary refill takes less than 2 seconds.  Neurological:     General: No focal deficit present.     Mental Status: She is alert.      ED Treatments / Results  Labs (all labs ordered are listed, but only abnormal results are displayed) Labs Reviewed - No data to display  EKG None  Radiology No results found.  Procedures Procedures (including critical care time)  Medications Ordered in ED Medications  albuterol (PROVENTIL HFA;VENTOLIN HFA) 108 (90 Base) MCG/ACT inhaler 2 puff (2 puffs Inhalation Given 06/28/18 1241)  ipratropium-albuterol (DUONEB) 0.5-2.5 (3) MG/3ML nebulizer solution 3 mL (has no administration in time range)  albuterol (PROVENTIL) (2.5 MG/3ML) 0.083% nebulizer solution (2.5 mg  Given 06/28/18 1131)  ipratropium-albuterol (DUONEB) 0.5-2.5 (3) MG/3ML nebulizer solution (3 mLs  Given 06/28/18 1131)  AEROCHAMBER PLUS FLO-VU MEDIUM MISC 1 each (1 each Other Given 06/28/18 1242)  dexamethasone (DECADRON) 10 MG/ML injection for Pediatric ORAL use 10 mg (10 mg Oral Given 06/28/18 1232)     Initial Impression / Assessment and Plan / ED Course  I have reviewed the triage vital signs and the nursing notes.  Pertinent labs & imaging results that were available during my care of the patient were reviewed by me and considered in my medical decision making (see chart for details).        Persistent cough -most consistent with a COPD exacerbation, possibly triggered by an acute URI.  Based on previous notes in epic, it appears that patient is meant to go for PFTs in the future.  She has been recommended to start a controller medication such as Spiriva in  the past, however she is not agreeable to taking this.  Patient is noted to have wheezing and persistent coughing productive of  white sputum on exam.  Symptoms improved with albuterol and DuoNeb treatments.  Chest x-ray from 2 days ago was negative for hypervolemia or evidence of pneumonia and she appears euvolemic on exam. She has discontinued azithromycin because it is diarrhea, this is okay given negative chest x-ray for pneumonia. -Patient has an intolerance to prednisone because it causes diarrhea and "overall craziness," she is agreeable to a dose of Decadron -Albuterol inhaler and spacer were provided -Clinical improvement after 2 breathing treatments in the ED -Promethazine- DM as needed for cough - follow up/return precautions provided   Final Clinical Impressions(s) / ED Diagnoses   Final diagnoses:  Cough    ED Discharge Orders         Ordered    promethazine-dextromethorphan (PROMETHAZINE-DM) 6.25-15 MG/5ML syrup  4 times daily PRN     06/28/18 1226           Howard Pouch, MD 06/28/18 1242    Little, Ambrose Finland, MD 07/04/18 [phone]

## 2018-06-28 NOTE — Discharge Instructions (Addendum)
You were seen and evaluated in the emergency department for cough.  You were given decadron and an albuterol inhaler. You were given a prescription for cough medication. The cough medicine may make you sleepy, so please do not drive after taking this medication.  If you develop difficulty breathing or chest pain, these would be reasons to return to medical care.

## 2018-06-29 ENCOUNTER — Telehealth: Payer: Self-pay

## 2018-06-29 NOTE — Telephone Encounter (Signed)
Left message for patient to remind of missed remote transmission.  

## 2018-07-06 ENCOUNTER — Encounter: Payer: Self-pay | Admitting: Cardiology

## 2018-07-13 ENCOUNTER — Encounter: Payer: Self-pay | Admitting: Gastroenterology

## 2018-08-04 ENCOUNTER — Other Ambulatory Visit (HOSPITAL_COMMUNITY): Payer: Self-pay | Admitting: Cardiology

## 2018-09-18 ENCOUNTER — Encounter: Payer: 59 | Admitting: *Deleted

## 2018-09-19 ENCOUNTER — Telehealth: Payer: Self-pay

## 2018-09-19 NOTE — Telephone Encounter (Signed)
Left message for patient to remind of missed remote transmission.  

## 2018-09-27 ENCOUNTER — Telehealth: Payer: Self-pay | Admitting: Cardiology

## 2018-09-27 ENCOUNTER — Telehealth (HOSPITAL_COMMUNITY): Payer: Self-pay

## 2018-09-27 NOTE — Telephone Encounter (Signed)
Pt called in requesting letter for work as she has been sick for several months and not able to physically manage. She is being tested for covid antibodies tomorrow as she has had a persistent cough and SOB. Advised she needs f/u with MD/PA so they can assess her current state.  She is amendable a virtual visit which is arranged for Monday.

## 2018-09-27 NOTE — Telephone Encounter (Signed)
New Message             Patient is calling today because she is trying to  get a letter stating that she can't work anymore. Patient states she can't clean her home she is having SOB/she has excretion, dizziness and can't walk very far either. Patient is taking medication like she is suppose. But nothing is helping Pls advise

## 2018-09-27 NOTE — Telephone Encounter (Signed)
Informed pt that I would forward to Dr. Alford Highland office to address her needs/concerns. Pt understands from a PPM standpoint she can work but it may be different from her heart failure side and that she can discuss this further w/ Shirlee Latch. Pt understands [facility] will f/u w/ her.

## 2018-09-28 ENCOUNTER — Other Ambulatory Visit (HOSPITAL_COMMUNITY): Payer: Self-pay | Admitting: Cardiology

## 2018-10-02 ENCOUNTER — Ambulatory Visit (HOSPITAL_COMMUNITY)
Admission: RE | Admit: 2018-10-02 | Discharge: 2018-10-02 | Disposition: A | Discharge status: Home / Self Care | Payer: 59 | Source: Ambulatory Visit | Attending: Cardiology | Admitting: Cardiology

## 2018-10-02 ENCOUNTER — Telehealth (HOSPITAL_COMMUNITY): Payer: Self-pay | Admitting: Cardiology

## 2018-10-02 ENCOUNTER — Telehealth: Payer: Self-pay

## 2018-10-02 ENCOUNTER — Encounter (HOSPITAL_COMMUNITY): Payer: Self-pay

## 2018-10-02 ENCOUNTER — Other Ambulatory Visit: Payer: Self-pay

## 2018-10-02 ENCOUNTER — Encounter (HOSPITAL_COMMUNITY): Payer: Self-pay | Admitting: Cardiology

## 2018-10-02 VITALS — BP 99/68 | Wt 152.0 lb

## 2018-10-02 DIAGNOSIS — I5022 Chronic systolic (congestive) heart failure: Secondary | ICD-10-CM | POA: Diagnosis not present

## 2018-10-02 DIAGNOSIS — I428 Other cardiomyopathies: Secondary | ICD-10-CM

## 2018-10-02 DIAGNOSIS — R05 Cough: Secondary | ICD-10-CM

## 2018-10-02 DIAGNOSIS — R059 Cough, unspecified: Secondary | ICD-10-CM

## 2018-10-02 DIAGNOSIS — I10 Essential (primary) hypertension: Secondary | ICD-10-CM

## 2018-10-02 DIAGNOSIS — R5383 Other fatigue: Secondary | ICD-10-CM

## 2018-10-02 DIAGNOSIS — E039 Hypothyroidism, unspecified: Secondary | ICD-10-CM

## 2018-10-02 NOTE — Progress Notes (Signed)
Heart Failure TeleHealth Note  Due to national recommendations of social distancing due to COVID 19, Audio/video telehealth visit is felt to be most appropriate for this patient at this time.  See MyChart message from today for patient consent regarding telehealth for [facility].  Date:  10/02/2018   ID:  Stacy Mcintosh, DOB [birth year], MRN [id number]  Location: Home  Provider location: [place] [facility] Heart Failure Type of Visit: Established patient   PCP:  Maud Deed, PA  Cardiologist:  No primary care provider on file. Primary HF: Dr Shirlee Latch   Chief Complaint: Heart Failure   History of Present Illness: Stacy Mcintosh is a 63 y.o. female with a history of hypothyroidism, nonischemic cardiomyopathy, lyme disease, and PE.  Hypothyroidism has been controlled by [place] thyroid.  She is now seeing Dr. Lucianne Muss for endocrine management.  Echo in 5/17 showed EF 35-40% with diffuse hypokinesis, repeat echo in 01/2016 with improved EF of 60-65%.   In 10/17, she was found to have complete heart block and a Medtronic dual chamber PPM was placed. Last echo in 10/18 showed EF 45% with apical hypokinesis.  She had a Cardiolite in 11/18 with EF 58%, small reversible apical defect, overall low risk study. Echo in 2/19 showed EF 45-50% with apical hypokinesis.  She saw Dr. Elberta Fortis who did not recommend CRT upgrade as EF was only very mildly decreased.   In March she was seen in the ED for cough. CXR was  She presents via Web designer for a telehealth visit today. Overall feeling fair.  Complaining of cough and fatigue. Mild dyspnea with exertion. Denies PND/Orthopnea. Able to walk 4,000 steps. Says she has extreme fatigue with daily house work. She continues to use an inhaler. Appetite ok. No fever or chills. Weight at home has been trending down from 158---> 150 pounds. Taking all medications. Currently not working. Previously works as Dietitian.     she denies symptoms worrisome for COVID  19.   ECHO 02/2018 EF 45-50%  ECHO  2018 EF 45%   L/RHC 09/08/17  - No angiographic CAD.  Hemodynamics RA mean 5 RV 27/8 PA 27/9, mean 17 PCWP mean 9 LV 114/12 AO 118/62 Oxygen saturations: PA 77% AO 100% Cardiac Output (Fick) 4.83  Cardiac Index (Fick) 2.91 Past Medical History:  Diagnosis Date  . Anxiety   . Arthritis    "lower back" (02/16/2016)  . BUNDLE BRANCH BLOCK, LEFT 11/12/2008  . CARDIOMYOPATHY 11/12/2008  . CHF (congestive heart failure) (HCC)    a.Echo 5/17: EF 35-40%, moderate MR, mild LAE, mild to moderate TR, PASP 50 mmHg  . Chronic lower back pain   . Depression   . DVT (deep venous thrombosis) (HCC) 2008   LLE  . Graves' disease    S/P radiation; "now up/down"  . HYPERTENSION 10/16/2007  . Hypothyroidism    h/o Graves Disease  . Migraine    "controlled w/piercings" (02/16/2016)  . Other and unspecified hyperlipidemia 01/19/2010  . PE (pulmonary embolism) 2008   Factor V negative  . Presence of permanent cardiac pacemaker   . Pure hypercholesterolemia 11/18/2008   Past Surgical History:  Procedure Laterality Date  . ABDOMINAL HYSTERECTOMY  1990s  . CARDIAC CATHETERIZATION  2009?  . CESAREAN SECTION  1974; 198j0; 1987   x3  . EP IMPLANTABLE DEVICE N/A 02/16/2016   Procedure: Pacemaker Implant;  Surgeon: Will Jorja Loa, MD;  Location: [facility] INVASIVE CV LAB;  Service: Cardiovascular;  Laterality: N/A;  . HERNIA REPAIR  1956  . INSERT / REPLACE / REMOVE PACEMAKER    . RIGHT/LEFT HEART CATH AND CORONARY ANGIOGRAPHY N/A 09/08/2017   Procedure: RIGHT/LEFT HEART CATH AND CORONARY ANGIOGRAPHY;  Surgeon: Larey Dresser, MD;  Location: [place] CV LAB;  Service: Cardiovascular;  Laterality: N/A;     Current Outpatient Medications  Medication Sig Dispense Refill  . ALPRAZolam (XANAX) 0.5 MG tablet Take 1 tablet (0.5 mg total) by mouth 3 (three) times daily as needed for anxiety. 30 tablet 0  . aspirin EC 81 MG tablet Take 81 mg by mouth daily.      . carvedilol (COREG) 6.25 MG tablet TAKE 1 TABLET BY MOUTH 2 TIMES DAILY WITH A MEAL. NEED APPOINTMENT FOR FUTURE REFILLS [phone] 180 tablet 0  . clonazePAM (KLONOPIN) 1 MG tablet Take 1 mg by mouth at bedtime as needed for anxiety.     Marland Kitchen doxepin (SINEQUAN) 10 MG capsule Take 20 mg by mouth at bedtime.     Marland Kitchen ENTRESTO 49-51 MG TAKE 1 TABLET BY MOUTH TWICE A DAY 180 tablet 1  . furosemide (LASIX) 40 MG tablet TAKE 1 TABLET (40 MG TOTAL) BY MOUTH DAILY. MAY TAKE AN ADDITIONAL 40MG  DAILY FOR FLUID. 114 tablet 1  . pantoprazole (PROTONIX) 40 MG tablet Take 1 tablet (40 mg total) by mouth daily. 90 tablet 3  . simvastatin (ZOCOR) 20 MG tablet Take 1 tablet (20 mg total) by mouth at bedtime. Office visit needed for additional refills (Patient taking differently: Take 40 mg by mouth at bedtime. Office visit needed for additional refills) 90 tablet 0  . spironolactone (ALDACTONE) 25 MG tablet TAKE 1 TABLET BY MOUTH EVERY DAY 90 tablet 3  . thyroid (ARMOUR THYROID) 60 MG tablet TAKE 1 TABLET (60 MG TOTAL) BY MOUTH DAILY BEFORE BREAKFAST. 90 tablet 3  . tiZANidine (ZANAFLEX) 4 MG tablet Take 4 mg by mouth 3 (three) times daily as needed.  6  . sacubitril-valsartan (ENTRESTO) 49-51 MG Take 1 tablet by mouth 2 (two) times daily.     No current facility-administered medications for this encounter.     Allergies:   Augmentin [amoxicillin-pot clavulanate]; Lisinopril; and Prednisone   Social History:  The patient  reports that she has never smoked. She has never used smokeless tobacco. She reports current alcohol use. She reports that she does not use drugs.   Family History:  The patient's family history includes Arthritis in her sister; Diabetes in her brother; Heart attack in her brother and mother; Hypertension in her mother and sister; Ovarian cancer in her mother; Prostate cancer in her father; Rheumatologic disease in her mother; Sudden death in her maternal grandfather; Thyroid disease in her  brother.   ROS:  Please see the history of present illness.   All other systems are personally reviewed and negative.   Today's Vitals   10/02/18 1141  BP: 99/68  Weight: 68.9 kg (152 lb)   Body mass index is 29.69 kg/m. Exam:  Tele Health Call; Exam is subjective General:  Speaks in full sentences. No resp difficulty. Lungs: Normal respiratory effort with conversation.  Abdomen: Non-distended per patient report Extremities: Pt denies edema. Neuro: Alert & oriented x 3.   Recent Labs: 03/08/2018: BUN 11; Creatinine, Ser 1.12; Potassium 4.2; Sodium 140  Personally reviewed   Wt Readings from Last 3 Encounters:  10/02/18 68.9 kg (152 lb)  06/28/18 81.6 kg (180 lb)  03/01/18 74.4 kg (164 lb)      ASSESSMENT AND PLAN:   1.  Chronic systolic CHF: Patient has a nonischemic cardiomyopathy that was thought initially to be due to uncontrolled hypothyroidism. Cardiac MRI in 2009 showed no LGE.  EF improved to 50-55% on echo in 2010. Cath in 2010 showed normal coronaries.  However, EF was back down to 30% in 11/15 and was 35-40% in 5/17.  Echo in 01/2016 with improvement to EF 60-65%.  However, 10/18 echo showed fall in EF to 45% with apical wall motion abnormality.  Cardiolite in 11/18 showed a small area of possible apical ischemia and EF 58%. [facility] 09/08/17 with normal cardiac output, filling pressures, and no CAD.   Most recent echo in 02/2018  showed EF 45-50% with apical hypokinesis. She is now RV pacing 99% of the time because of PPM implanted for CHB, Dr Elberta Fortisamnitz opted against CRT upgrade as EF is only mildly depressed.   NYHA IIIb. Volume status sound stable.  - Continue Spiro 25 mg daily - Continue Entresto 49/51 bid.   - Continue Coreg 6.25 mg bid with fatigue. Would not increase due to fatigue.  2. Hypothyroidism:  Check TSH  3. HYPERCHOLESTEROLEMIA:  - Continue simvastatin.  4. Pulmonary embolism: In 2009.  - No longer taking Coumadin.  - Continue daily ASA.  5. Complete  heart block:  - Medtronic PPM, See above.  6. Depression:  7. Day Time Fatigue Would like to set up sleep study however she is afraid she cant afford the test. Hold off for now.  8. Cough No fever or chills.    COVID screen The patient does not have any symptoms that suggest any further testing/ screening at this time.  Social distancing reinforced today.  Patient Risk: After full review of this patients clinical status, I feel that they are at moderate risk for cardiac decompensation at this time.  Relevant cardiac medications were reviewed at length with the patient today. The patient does not have concerns regarding their medications at this time.   The following changes were made today:  Check BMET and TSH   Recommended follow-up:  In 3 months with Dr Shirlee LatchMcLean  .I will discuss work status with Dr Shirlee LatchMclean.   Today, I have spent 25  minutes with the patient with telehealth technology discussing the above issues .    Stacy MartinsSigned, Stacy Riedesel, NP  10/02/2018 11:48 AM  [facility] [address] [facility] [place] [address] [phone] (office) [phone] (fax)

## 2018-10-02 NOTE — Telephone Encounter (Signed)
1. Lab orders placed Lab appt 6/15 @ 215 Pt aware  2. Follow up 3-4 months -message to schedulers   Patient aware of AVS instructions and copy sent via My Chart messages

## 2018-10-02 NOTE — Telephone Encounter (Signed)
Call to patient to assist with sending a device remote transmission as requested by Darrick Grinder, NP following virtual visit today.  She is getting an 3230 error message on the smart phone app and unable to get a download.  Advised to call Medtronic tech service number.  She tried the app tech number but could not get a customer rep on the phone.  Provided a different Medtronic tech service number [phone].   Advised her to contact the HF clinic if she is able to send a report.  She has the office number.

## 2018-10-02 NOTE — Telephone Encounter (Signed)
-----   Message from Conrad Sedan, NP sent at 10/02/2018 12:09 PM EDT ----- Regarding: avs   Follow up with 3-4 Dr Aundra Dubin .   Check BMET TSH next week.    A

## 2018-10-02 NOTE — Addendum Note (Signed)
Encounter addended by: Kerry Dory, CMA on: 10/02/2018 12:24 PM  Actions taken: Diagnosis association updated, Order list changed, Clinical Note Signed

## 2018-10-02 NOTE — Patient Instructions (Signed)
It was great to speak with you today! No medication changes are needed at this time.   Labs needed in one week 10/08/18 @ 215pm  Your physician recommends that you schedule a follow-up appointment in: 3-4 months with Dr Aundra Dubin -feel free to contact us in September to schedule if we have not already reached out to you    Do the following things EVERYDAY: 1) Weigh yourself in the morning before breakfast. Write it down and keep it in a log. 2) Take your medicines as prescribed 3) Eat low salt foods-Limit salt (sodium) to 2000 mg per day.  4) Stay as active as you can everyday 5) Limit all fluids for the day to less than 2 liters

## 2018-10-04 IMAGING — DX DG CHEST 2V
2 series · 2 of 2 positions shown · non-contrast
Comparison: Radiographs September 17, 2015.

CLINICAL DATA: Cardiac device in situ.

EXAM:
CHEST  2 VIEW

[chest pa]
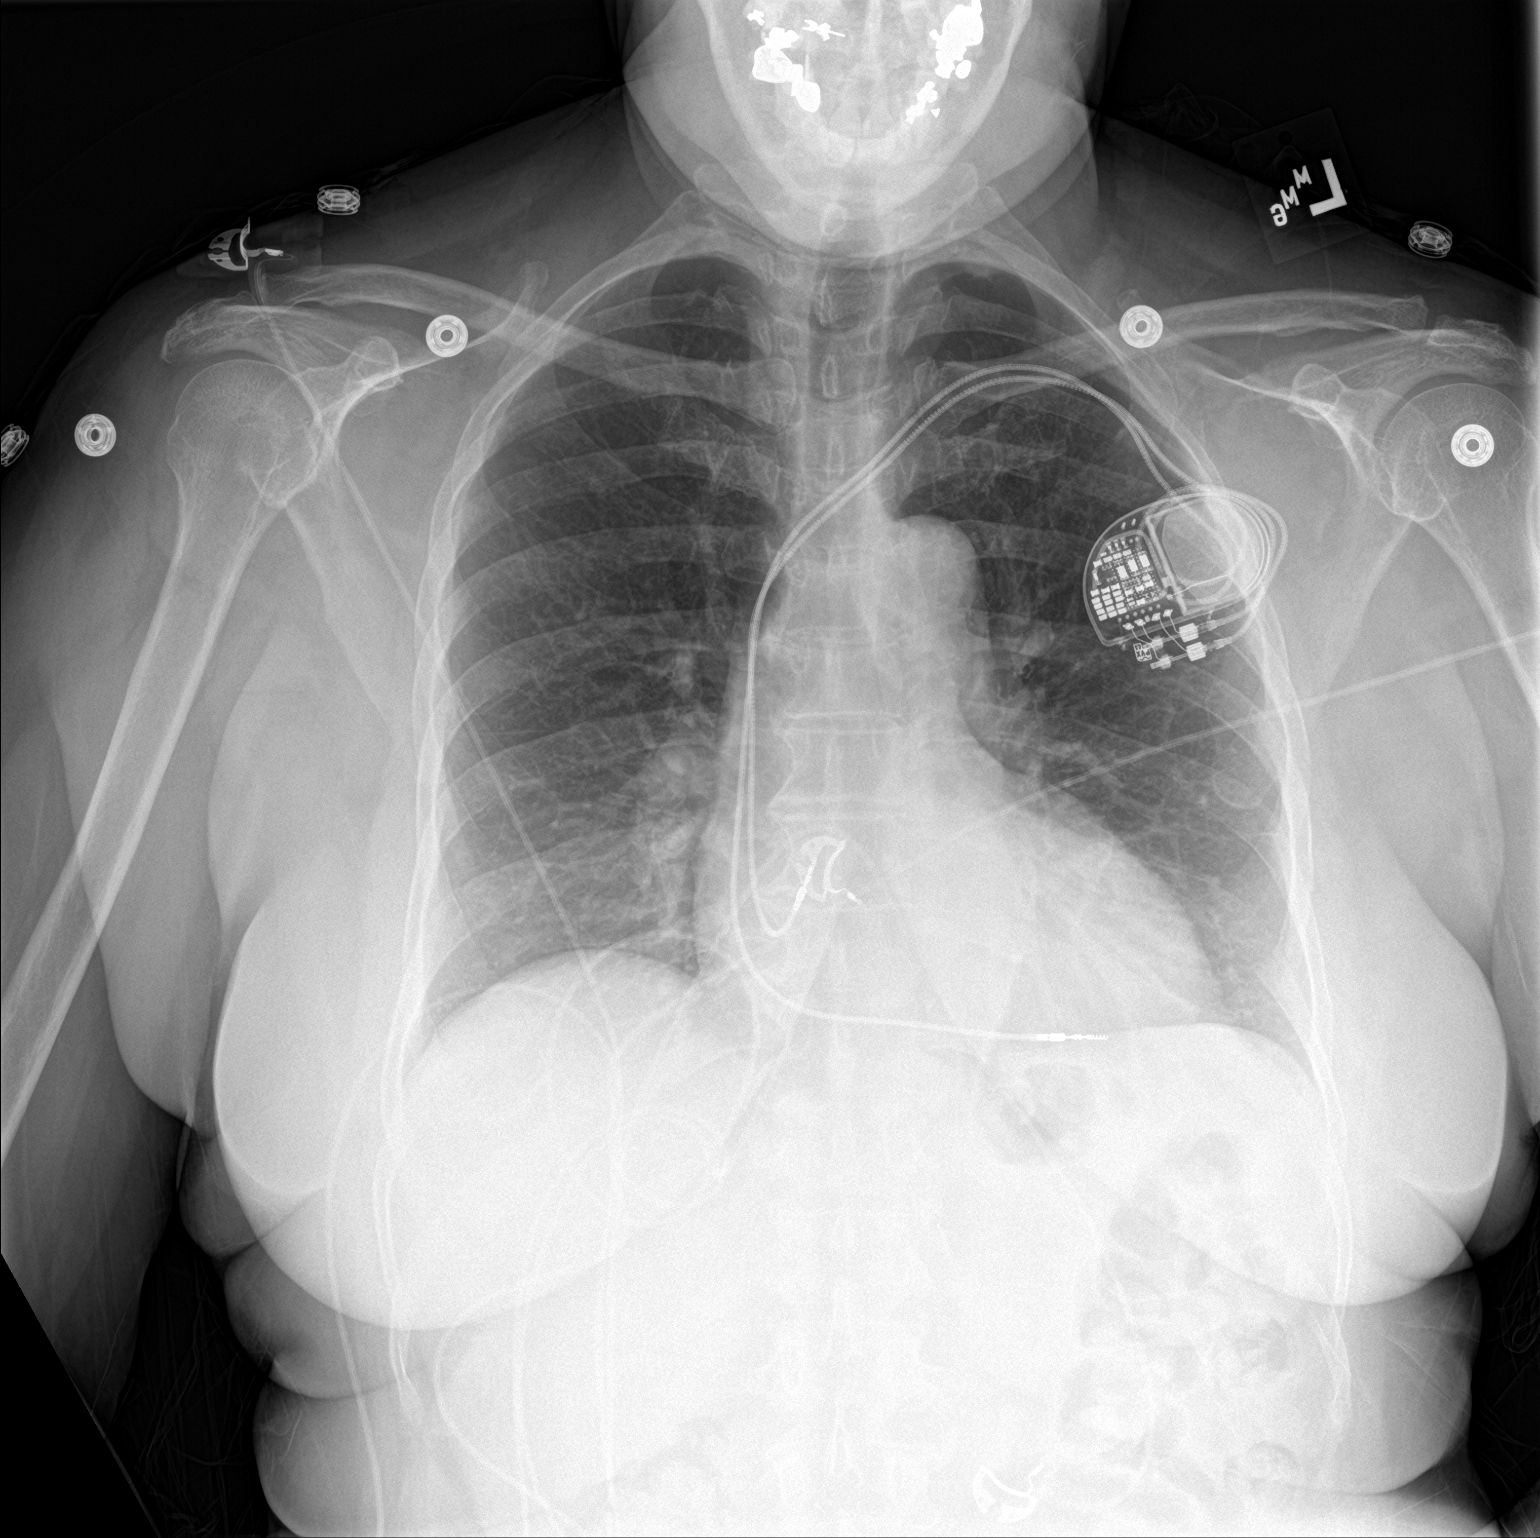

[chest lat]
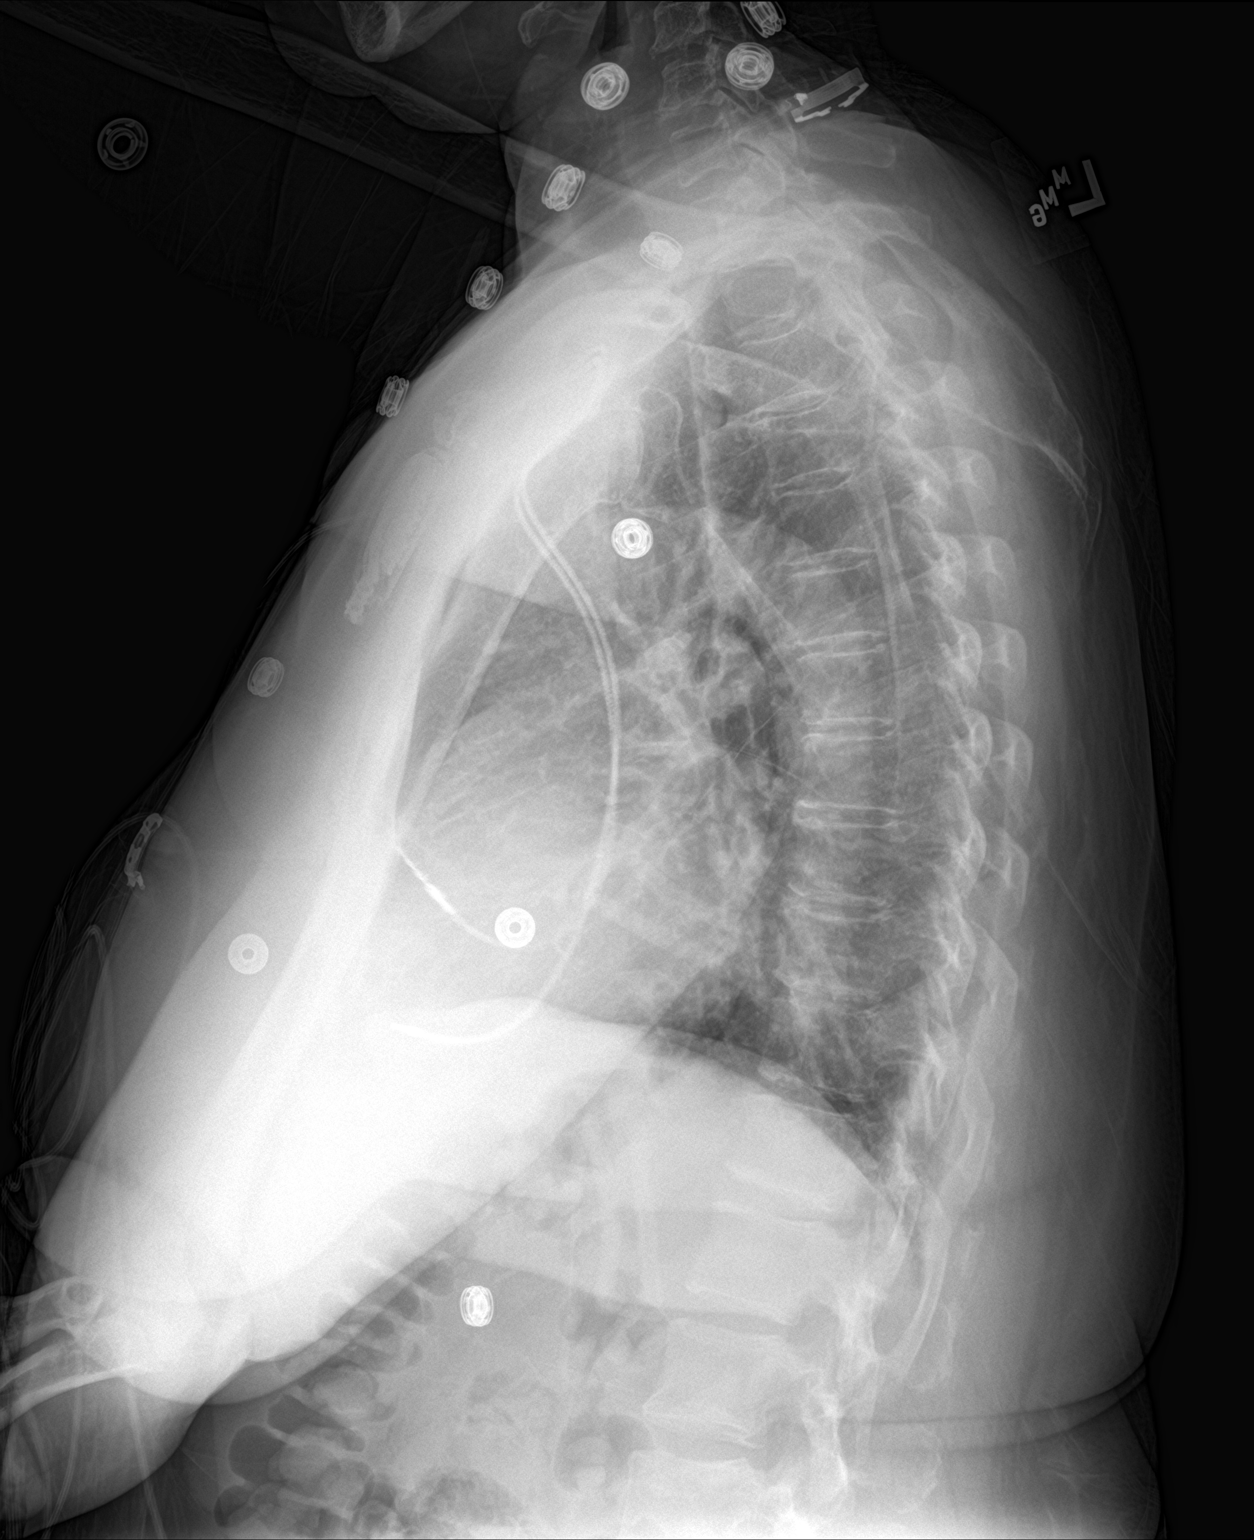

[2 of 2 positions shown; findings below may reference images not displayed]

FINDINGS: The heart size and mediastinal contours are within normal limits.
Both lungs are clear. No pneumothorax or pleural effusion is noted.
Interval placement of left-sided pacemaker with leads in grossly
good position. The visualized skeletal structures are unremarkable.
IMPRESSION: Interval placement of left-sided pacemaker with leads in grossly
good position. No pneumothorax is noted.

## 2018-10-08 ENCOUNTER — Other Ambulatory Visit (HOSPITAL_COMMUNITY): Payer: 59

## 2018-10-15 ENCOUNTER — Other Ambulatory Visit: Payer: Self-pay

## 2018-10-15 ENCOUNTER — Ambulatory Visit (HOSPITAL_COMMUNITY)
Admission: RE | Admit: 2018-10-15 | Discharge: 2018-10-15 | Disposition: A | Discharge status: Home / Self Care | Payer: 59 | Source: Ambulatory Visit | Attending: Cardiology | Admitting: Cardiology

## 2018-10-15 DIAGNOSIS — E039 Hypothyroidism, unspecified: Secondary | ICD-10-CM | POA: Diagnosis not present

## 2018-10-15 DIAGNOSIS — I5022 Chronic systolic (congestive) heart failure: Secondary | ICD-10-CM | POA: Diagnosis present

## 2018-10-15 LAB — BASIC METABOLIC PANEL
Anion gap: 11 (ref 5–15)
BUN: 17 mg/dL (ref 8–23)
CO2: 24 mmol/L (ref 22–32)
Calcium: 9.7 mg/dL (ref 8.9–10.3)
Chloride: 101 mmol/L (ref 98–111)
Creatinine, Ser: 1.18 mg/dL — ABNORMAL HIGH (ref 0.44–1.00)
GFR calc Af Amer: 57 mL/min — ABNORMAL LOW (ref >60)
GFR calc non Af Amer: 49 mL/min — ABNORMAL LOW (ref >60)
Glucose, Bld: 111 mg/dL — ABNORMAL HIGH (ref 70–99)
Potassium: 3.8 mmol/L (ref 3.5–5.1)
Sodium: 136 mmol/L (ref 135–145)

## 2018-10-15 LAB — TSH: TSH: 0.881 u[IU]/mL (ref 0.350–4.500)

## 2018-11-23 ENCOUNTER — Telehealth (HOSPITAL_COMMUNITY): Payer: Self-pay

## 2018-11-23 NOTE — Telephone Encounter (Signed)
Pt called requesting an appointment with the Dr. Because she has been having increased fatigue, shortness of breath with exertion and coughing for the past couple months despite being covid negative. Pt reports having taken furosemide 40mg  bid as instructed by another provider for at least one day. I offered to take her symptoms to the NP but she insisted that she would be fine to see the doctor next week and just keep taking furosemide 40mg  bid until then. Advised that if her symptoms get worse that she should report back to the advanced heart failure clinic.

## 2018-11-29 ENCOUNTER — Ambulatory Visit (HOSPITAL_COMMUNITY)
Admission: RE | Admit: 2018-11-29 | Discharge: 2018-11-29 | Disposition: A | Discharge status: Home / Self Care | Payer: 59 | Source: Ambulatory Visit | Attending: Cardiology | Admitting: Cardiology

## 2018-11-29 ENCOUNTER — Encounter (HOSPITAL_COMMUNITY): Payer: Self-pay | Admitting: Cardiology

## 2018-11-29 ENCOUNTER — Other Ambulatory Visit: Payer: Self-pay

## 2018-11-29 VITALS — BP 110/70 | HR 90 | Wt 168.2 lb

## 2018-11-29 DIAGNOSIS — E7800 Pure hypercholesterolemia, unspecified: Secondary | ICD-10-CM | POA: Insufficient documentation

## 2018-11-29 DIAGNOSIS — R0683 Snoring: Secondary | ICD-10-CM | POA: Diagnosis not present

## 2018-11-29 DIAGNOSIS — I110 Hypertensive heart disease with heart failure: Secondary | ICD-10-CM | POA: Diagnosis not present

## 2018-11-29 DIAGNOSIS — Z7982 Long term (current) use of aspirin: Secondary | ICD-10-CM | POA: Diagnosis not present

## 2018-11-29 DIAGNOSIS — I442 Atrioventricular block, complete: Secondary | ICD-10-CM | POA: Diagnosis not present

## 2018-11-29 DIAGNOSIS — Z833 Family history of diabetes mellitus: Secondary | ICD-10-CM | POA: Diagnosis not present

## 2018-11-29 DIAGNOSIS — J45909 Unspecified asthma, uncomplicated: Secondary | ICD-10-CM | POA: Insufficient documentation

## 2018-11-29 DIAGNOSIS — Z86711 Personal history of pulmonary embolism: Secondary | ICD-10-CM | POA: Diagnosis not present

## 2018-11-29 DIAGNOSIS — I5022 Chronic systolic (congestive) heart failure: Secondary | ICD-10-CM | POA: Insufficient documentation

## 2018-11-29 DIAGNOSIS — F329 Major depressive disorder, single episode, unspecified: Secondary | ICD-10-CM | POA: Insufficient documentation

## 2018-11-29 DIAGNOSIS — E039 Hypothyroidism, unspecified: Secondary | ICD-10-CM | POA: Diagnosis not present

## 2018-11-29 DIAGNOSIS — E890 Postprocedural hypothyroidism: Secondary | ICD-10-CM | POA: Diagnosis not present

## 2018-11-29 DIAGNOSIS — Z8249 Family history of ischemic heart disease and other diseases of the circulatory system: Secondary | ICD-10-CM | POA: Diagnosis not present

## 2018-11-29 DIAGNOSIS — I428 Other cardiomyopathies: Secondary | ICD-10-CM | POA: Insufficient documentation

## 2018-11-29 DIAGNOSIS — Z79899 Other long term (current) drug therapy: Secondary | ICD-10-CM | POA: Diagnosis not present

## 2018-11-29 DIAGNOSIS — Z950 Presence of cardiac pacemaker: Secondary | ICD-10-CM | POA: Insufficient documentation

## 2018-11-29 DIAGNOSIS — Z86718 Personal history of other venous thrombosis and embolism: Secondary | ICD-10-CM | POA: Insufficient documentation

## 2018-11-29 NOTE — Patient Instructions (Signed)
EKG done today.  Your provider has recommended that you have a home sleep study.  [employer] is the company that provides these and will send the equipment right to your home with instructions on how to set it up.  Once you have completed the test you just dispose of the equipment, the information is automatically uploaded to [place].  IF you have any questions or issues with the equipment please call the company.  If your test was positive and you need a home CPAP machine you will be contacted by Dr Theodosia Blender office [facility]) to set this up.  Your physician has requested that you have an echocardiogram. Echocardiography is a painless test that uses sound waves to create images of your heart. It provides your doctor with information about the size and shape of your heart and how well your heart's chambers and valves are working. This procedure takes approximately one hour. There are no restrictions for this procedure. Someone will call you in order to schedule this.  You have been referred to the Heart Failure Education officer, museum.  Please follow up with the [facility] in 3-4 months.  At the [facility], you and your health needs are our priority. As part of our continuing mission to provide you with exceptional heart care, we have created designated Provider Care Teams. These Care Teams include your primary Cardiologist (physician) and Advanced Practice Providers (APPs- Physician Assistants and Nurse Practitioners) who all work together to provide you with the care you need, when you need it.   You may see any of the following providers on your designated Care Team at your next follow up: Marland Kitchen Dr Glori Bickers . Dr Loralie Champagne . Darrick Grinder, NP   Please be sure to bring in all your medications bottles to every appointment.

## 2018-11-29 NOTE — Progress Notes (Signed)
CSW consulted to speak with pt regarding questions with disability.  CSW explained that she would need to call social security office and set up phone interview to apply or could apply online.  Pt states she is already receiving early social security benefits.  CSW suggested she call social security and discuss if it makes sense for her to attempt applying for disability since she is already drawing social security.  Pt also expressed a lot of concerns with anxiety and with current marital discord.  Pt has strong support system through her children but has lost reportedly 8 family members in the past 9 months which has taken a toll on her.  Pt says she would have a way to leave her husband if she wanted but isn't quite ready to take that step.  Pt sees a psychologist and a counselor so has good mental health network helping her with anxiety and social concerns.  CSW provided active listening and support- no resources needs at this time as patient is well connected with mental health providers and reports feeling safe at home.  CSW will continue to follow through clinic and assist as needed  Jorge Ny, [facility] Desk#: [phone] Cell#: [phone]

## 2018-11-29 NOTE — Progress Notes (Signed)
Patient Name:         DOB:       Height:     Weight:  Office Name:         Referring Provider:  Today's Date:  Date:   STOP BANG RISK ASSESSMENT S (snore) Have you been told that you snore?     YES   T (tired) Are you often tired, fatigued, or sleepy during the day?   YES  O (obstruction) Do you stop breathing, choke, or gasp during sleep? NO   P (pressure) Do you have or are you being treated for high blood pressure? YES   B (BMI) Is your body index greater than 35 kg/m? NO   A (age) Are you 33 years old or older? YES   N (neck) Do you have a neck circumference greater than 16 inches?   NO   G (gender) Are you a female? NO   TOTAL STOP/BANG "YES" ANSWERS                                                                        For Office Use Only              Procedure Order Form    YES to 3+ Stop Bang questions OR two clinical symptoms - patient qualifies for WatchPAT (CPT 95800)     Submit: This Form + Patient Face Sheet + Clinical Note via CloudPAT or Fax: [phone]         Clinical Notes: Will consult Sleep Specialist and refer for management of therapy due to patient increased risk of Sleep Apnea. Ordering a sleep study due to the following two clinical symptoms: Excessive daytime sleepiness G47.10  /  Loud snoring R06.83 /  History of high blood pressure R03.0    I understand that I am proceeding with a home sleep apnea test as ordered by my treating physician. I understand that untreated sleep apnea is a serious cardiovascular risk factor and it is my responsibility to perform the test and seek management for sleep apnea. I will be contacted with the results and be managed for sleep apnea by a local sleep physician. I will be receiving equipment and further instructions from [facility]. I shall promptly ship back the equipment via the included mailing label. I understand my insurance will be billed for the test and as the patient I am responsible for any insurance  related out-of-pocket costs incurred. I have been provided with written instructions and can call for additional video or telephonic instruction, with 24-hour availability of qualified personnel to answer any questions: Patient Help Desk [phone].   Patient Telemedicine Verbal Consent

## 2018-11-29 NOTE — Progress Notes (Signed)
Order, OV note, stop bang and demographics all faxed to [facility] [employer] via epic  

## 2018-11-30 NOTE — Progress Notes (Signed)
Patient ID: Stacy Mcintosh, female   DOB: [birth year], 63 y.o.   MRN: [id number] PCP: Deneen Harts Cardiology: Dr. Shirlee Latch  63 yo with history of hypothyroidism, nonischemic cardiomyopathy, and PE.  Hypothyroidism has been controlled by [facility] thyroid.  She is now seeing Dr. Lucianne Muss for endocrine management.  Echo in 5/17 showed EF 35-40% with diffuse hypokinesis, repeat echo in 01/2016 with improved EF of 60-65%.   In 10/17, she was found to have complete heart block and a Medtronic dual chamber PPM was placed. Last echo in 10/18 showed EF 45% with apical hypokinesis.  She had a Cardiolite in 11/18 with EF 58%, small reversible apical defect, overall low risk study. Echo in 2/19 showed EF 45-50% with apical hypokinesis.  She saw Dr. Elberta Fortis who did not recommend CRT upgrade as EF was only very mildly decreased.   LHC/RHC (5/19) showed no CAD, optimized filling pressures.   Echo in 11/19 showed EF 45-50%, mild LVH, normal RV.   She returns today for followup of CHF.  She has dyspnea with moderate activity such as stairs, hills, moderate housework.  She fatigues easily and feels exhausted "all the time."  Very hard to make it through a work day due to fatigue.  Has been told that she snores.  No chest pain.  No orthopnea/PND.    Labs (10/10): BNP 13, K 3.7, creatinine 0.8 Labs (7/11): K 4.1, creatinine 0.6 Labs (7/12): LDL 143, HDL 74, K 3.3, creatinine 1.0, TSH normal Labs (12/12): K 4, creatinine 0.8, HDL 85, LDL 107 Labs (11/15): K 4.2, creatinine 0.74, LDL 78, HDL 76 Labs (4/17): TSH normal, K 4.5, creatinine 0.92, HCT 42.2 Labs (5/17): LDL 105, HDL 72, BNP 261 Labs (6/17): D dimer negative, BNP 64 Labs (8/17): K 4.2, creatinine 1.05, TSH 0.329 (low), K 4.2, creatinine 1.05, hgb 15.2, BNP 25 Labs (10/17): K 3.5, creatinine 1.06 Labs (9/18): K 3.6, creatinine 1.07, TSH normal Labs (2/19): K 3.6, creatinine 0.99 Labs (6/20): K 3.8, creatinine 1.18, TSH normal  ECG (personally  reviewed): NSR, v-paced.   Allergies (verified):  1)  ! Lisinopril  Past Medical History: 1. Hypothyroidism and history of Graves disease.  She is status post radioactive iodine therapy.  She then developed hypothyroidism, and the hypothyroidism has been difficult to control.   2. Cardiomyopathy, nonischemic.  The patient had a cardiac MRI done during her hospitalization in July 2009.  This showed moderate LV enlargement and global LV hypokinesis.  There was a septal paradox pattern.  EF was calculated to be 27%.  There was mild left atrial enlargement.  The RV was normal in size and function.  There was no ASD or VSD.  There was no evidence for delayed enhancement pattern  in the LV myocardium suggesting that there was no scar from coronary artery disease and no definite evidence for infiltrative cardiomyopathy.  HIV was negative.  She did have an echo done also in the hospital in July 2009 that showed severe mitral regurgitation.  A TEE done following this showed an EF of 25-30% with severe mitral regurgitation.  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  The patient did have a left and right heart catheterization after treatment for congestive heart failure in January 2010.  There was no angiographic coronary disease.  EF had improved to 55-60% with no significant mitral  regurgitation.  The hemodynamics were totally normal on the right heart catheterization.  The cardiomyopathy, I think, was  likely due to poorly controlled hypothyroidism.   - Echo (8/10): EF 50-55%, mild LV dilatation, mild-mod MR, PASP 32 - Echo (11/15): EF 30% - Echo (5/17): EF 35-40%, mild-moderate TR, moderate MR, PASP 50 mmHg.  - Echo (10/17): EF 60-65%. Mild TR  - Echo (10/18): EF 45%, apical hypokinesis.  - Cardiolite (11/18): EF 58%, reversible apical defect (small), cannot rule out ischemia, low risk overall.  - Echo (2/19): EF 45-50%, apical hypokinesis - LHC [facility]/RHC (5/19): No significant coronary  disease. Mean RA 5, PA 27/9, PCWP 9, CI 2.91.  - Echo (11/19): EF 45-50%, mild LVH, normal RV.  3. PE/DVT.  July 2009.  The patient had been very sedentary prior to this due to her severe hypothyroidism.  She had  normal antithrombin III.  She was negative for factor V Leiden.  She had normal protein C and protein S function.  Now off coumadin.  4. Hypertension. 5. History of left bundle-branch block. 6. Asthma. 7. Migraines. 8. ACEI cough 9. Low back pain: L-spine disease.  10. Lyme disease: Treated in 12/16.  11. Complete heart block with Medtronic dual chamber PPM.  12. Depression  Family History: Mother-living; heart attack, HTN, Rheumatism Father-deceased age 64; prostate cancer Brother-living, parathyroid problem Sister- living; arthiritis Brother- living; premature heart attack x 2; DM-insulin Niece- blood clot in groin  Social History: Married with 3 children  Teacher, adult educationMassage Therapist. Never smoked ETOH-no   Review of systems complete and found to be negative unless listed in HPI.   Current Outpatient Medications  Medication Sig Dispense Refill  . ALPRAZolam (XANAX) 0.5 MG tablet Take 1 tablet (0.5 mg total) by mouth 3 (three) times daily as needed for anxiety. 30 tablet 0  . aspirin EC 81 MG tablet Take 81 mg by mouth daily.     . carvedilol (COREG) 6.25 MG tablet TAKE 1 TABLET BY MOUTH 2 TIMES DAILY WITH A MEAL. NEED APPOINTMENT FOR FUTURE REFILLS [phone] 180 tablet 0  . clonazePAM (KLONOPIN) 1 MG tablet Take 1 mg by mouth at bedtime as needed for anxiety.     Marland Kitchen. doxepin (SINEQUAN) 10 MG capsule Take 20 mg by mouth at bedtime.     Marland Kitchen. ENTRESTO 49-51 MG TAKE 1 TABLET BY MOUTH TWICE A DAY 180 tablet 1  . furosemide (LASIX) 40 MG tablet TAKE 1 TABLET (40 MG TOTAL) BY MOUTH DAILY. MAY TAKE AN ADDITIONAL 40MG  DAILY FOR FLUID. 114 tablet 1  . pantoprazole (PROTONIX) 40 MG tablet Take 1 tablet (40 mg total) by mouth daily. 90 tablet 3  . sacubitril-valsartan (ENTRESTO) 49-51 MG  Take 1 tablet by mouth 2 (two) times daily.    . simvastatin (ZOCOR) 20 MG tablet Take 1 tablet (20 mg total) by mouth at bedtime. Office visit needed for additional refills (Patient taking differently: Take 40 mg by mouth at bedtime. Office visit needed for additional refills) 90 tablet 0  . spironolactone (ALDACTONE) 25 MG tablet TAKE 1 TABLET BY MOUTH EVERY DAY 90 tablet 3  . thyroid (ARMOUR THYROID) 60 MG tablet TAKE 1 TABLET (60 MG TOTAL) BY MOUTH DAILY BEFORE BREAKFAST. 90 tablet 3  . tiZANidine (ZANAFLEX) 4 MG tablet Take 4 mg by mouth 3 (three) times daily as needed. 1/2tqb prn  6  . VENTOLIN HFA 108 (90 Base) MCG/ACT inhaler INHALE 1 TO 2 PUFFS EVERY 4 6 HOURS AS NEEDED FOR WHEEZING.     No current facility-administered medications for this encounter.     BP 110/70   Pulse  90   Wt 76.3 kg (168 lb 3.2 oz)   SpO2 98%   BMI 32.85 kg/m  General: NAD Neck: No JVD, no thyromegaly or thyroid nodule.  Lungs: Clear to auscultation bilaterally with normal respiratory effort. CV: Nondisplaced PMI.  Heart regular S1/S2, no S3/S4, no murmur.  No peripheral edema.  No carotid bruit.  Normal pedal pulses.  Abdomen: Soft, nontender, no hepatosplenomegaly, no distention.  Skin: Intact without lesions or rashes.  Neurologic: Alert and oriented x 3.  Psych: Normal affect. Extremities: No clubbing or cyanosis.  HEENT: Normal.   Assessment/Plan:  1. Chronic systolic CHF:  Patient has a nonischemic cardiomyopathy that was thought initially to be due to uncontrolled hypothyroidism. Cardiac MRI in 2009 showed no LGE.  EF improved to 50-55% on echo in 2010. Cath in 2010 showed normal coronaries.  However, EF was back down to 30% in 11/15 and was 35-40% in 5/17.  Echo in 01/2016 with improvement to EF 60-65%.  However, 10/18 echo showed fall in EF to 45% with apical wall motion abnormality.  Cardiolite in 11/18 showed a small area of possible apical ischemia and EF 58%. 5/19 LHC/RHC showed no CAD, normal  filling pressures and cardiac output.  Most recent echo in 11/19 showed EF 45-50% with apical hypokinesis. She is now RV pacing 99% of the time because of PPM implanted for CHB, Dr Curt Bears opted against CRT upgrade as EF is only mildly depressed.  Ongoing NYHA class III symptoms with prominent fatigue.  - Continue Spiro 25 mg daily - Continue Entresto 49/51 bid.   - Continue Coreg 6.25 mg bid.  - Repeat echo, if EF is lower, will push for CRT upgrade.    2. Hypothyroidism: Per Dr. Dwyane Dee.  3. HYPERCHOLESTEROLEMIA:  - Continue simvastatin.  4. Pulmonary embolism: In 2009. No longer taking Coumadin.  - Continue daily ASA.  5. Complete heart block: Medtronic PPM, See above.  6. Depression: Think this plays a large role in her symptoms 7. Suspect OSA: Fatigue/daytime sleepiness, snoring.  - I will order a home sleep study.   Followup 3-4 months.   Loralie Champagne, MD  11/30/2018

## 2018-12-02 ENCOUNTER — Other Ambulatory Visit: Payer: Self-pay | Admitting: Cardiology

## 2018-12-17 ENCOUNTER — Other Ambulatory Visit (HOSPITAL_COMMUNITY): Payer: Self-pay | Admitting: Cardiology

## 2018-12-26 ENCOUNTER — Other Ambulatory Visit (HOSPITAL_COMMUNITY): Payer: Self-pay | Admitting: Cardiology

## 2019-01-01 ENCOUNTER — Other Ambulatory Visit: Payer: Self-pay

## 2019-01-01 ENCOUNTER — Ambulatory Visit (HOSPITAL_COMMUNITY)
Admission: RE | Admit: 2019-01-01 | Discharge: 2019-01-01 | Disposition: A | Discharge status: Home / Self Care | Payer: 59 | Source: Ambulatory Visit | Attending: Cardiology | Admitting: Cardiology

## 2019-01-01 DIAGNOSIS — I429 Cardiomyopathy, unspecified: Secondary | ICD-10-CM | POA: Diagnosis not present

## 2019-01-01 DIAGNOSIS — I5022 Chronic systolic (congestive) heart failure: Secondary | ICD-10-CM | POA: Insufficient documentation

## 2019-01-01 NOTE — Progress Notes (Signed)
  Echocardiogram 2D Echocardiogram has been performed.  Stacy Mcintosh 01/01/2019, 3:06 PM

## 2019-01-02 ENCOUNTER — Telehealth (HOSPITAL_COMMUNITY): Payer: Self-pay

## 2019-01-02 ENCOUNTER — Other Ambulatory Visit (HOSPITAL_COMMUNITY): Payer: Self-pay | Admitting: Cardiology

## 2019-01-02 NOTE — Telephone Encounter (Signed)
-----   Message from Larey Dresser, MD sent at 01/01/2019  4:03 PM EDT ----- EF 55%, mildly dilated RV with normal systolic function.  No worrisome abnormalities.

## 2019-01-02 NOTE — Telephone Encounter (Signed)
PT AWARE OF RESULTS AND APPRECIATIVE

## 2019-02-06 ENCOUNTER — Other Ambulatory Visit (HOSPITAL_COMMUNITY): Payer: Self-pay

## 2019-02-06 MED ORDER — FUROSEMIDE 40 MG PO TABS: 40.0000 mg | ORAL_TABLET | Freq: Every day | ORAL | 1 refills | Status: DC

## 2019-02-28 ENCOUNTER — Other Ambulatory Visit: Payer: Self-pay

## 2019-02-28 DIAGNOSIS — Z20822 Contact with and (suspected) exposure to covid-19: Secondary | ICD-10-CM

## 2019-03-01 LAB — NOVEL CORONAVIRUS, NAA: SARS-CoV-2, NAA: NOT DETECTED

## 2019-03-06 ENCOUNTER — Encounter (HOSPITAL_COMMUNITY): Payer: 59 | Admitting: Cardiology

## 2019-03-13 ENCOUNTER — Telehealth (HOSPITAL_COMMUNITY): Payer: Self-pay | Admitting: Surgery

## 2019-03-13 NOTE — Telephone Encounter (Signed)
Called patient to inquire about pending sleep study and equipment.  I left a message to return my call.

## 2019-03-29 ENCOUNTER — Other Ambulatory Visit (HOSPITAL_COMMUNITY): Payer: Self-pay

## 2019-03-29 MED ORDER — CARVEDILOL 6.25 MG PO TABS: ORAL_TABLET | ORAL | 3 refills | Status: DC

## 2019-04-23 ENCOUNTER — Telehealth (HOSPITAL_COMMUNITY): Payer: Self-pay | Admitting: Pharmacy Technician

## 2019-04-23 NOTE — Telephone Encounter (Signed)
Patient Advocate Encounter  Completed application for [employer] Patient Assistance Program sent in an effort to reduce the patient's out of pocket expense for Entresto to $0.    Application completed and faxed to [phone].   [employer] patient assistance phone number for follow up is [phone].   This encounter will be updated until final determination.  Amarianna Abplanalp F Tallie Hevia, CPhT  

## 2019-05-08 ENCOUNTER — Telehealth (HOSPITAL_COMMUNITY): Payer: Self-pay | Admitting: Cardiology

## 2019-05-08 NOTE — Telephone Encounter (Signed)
Patient called to check the status of her disability paperwork -advised our office did not have a specific fax from the [facility] Disability Dept. For Dr Shirlee Latch to complete. Records request was received 04/22/19 and has been processed by medical records. Most likely records were greater than 20 pgs and needed to be returned via mail  Patient also reports that she no longer has insurance and will need assistance with medications. Advised would need to send to CHF Clinic SW team for  HF fund enrollment.

## 2019-05-10 ENCOUNTER — Telehealth (HOSPITAL_COMMUNITY): Payer: Self-pay | Admitting: Licensed Clinical Social Worker

## 2019-05-10 NOTE — Telephone Encounter (Signed)
CSW attempted to call patient to discuss current lack of insurance- unable to reach- left message  CSW will continue to reach out to discuss  Burna Sis, LCSW Clinical Social Worker Advanced [facility] Failure Clinic Desk#: [phone] Cell#: [phone]

## 2019-05-13 MED ORDER — FUROSEMIDE 40 MG PO TABS: 40.0000 mg | ORAL_TABLET | Freq: Every day | ORAL | 1 refills | Status: DC

## 2019-05-13 MED ORDER — SPIRONOLACTONE 25 MG PO TABS: 25.0000 mg | ORAL_TABLET | Freq: Every day | ORAL | 1 refills | Status: DC

## 2019-05-13 MED ORDER — CARVEDILOL 6.25 MG PO TABS: ORAL_TABLET | ORAL | 1 refills | Status: DC

## 2019-05-13 MED ORDER — SIMVASTATIN 40 MG PO TABS: 40.0000 mg | ORAL_TABLET | Freq: Every day | ORAL | 1 refills | Status: DC

## 2019-05-13 MED FILL — SIMVASTATIN 40 MG TABLET: 40 | 30 days supply | Qty: 30 | Fill #0

## 2019-05-13 MED FILL — SPIRONOLACTONE 25 MG TABS: 25 | 30 days supply | Qty: 30 | Fill #0

## 2019-05-13 MED FILL — FUROSEMIDE 40 MG TAB: 40 | 23 days supply | Qty: 45 | Fill #0

## 2019-05-13 MED FILL — CARVEDILOL 6.25 MG TABLET: 6.25 | 30 days supply | Qty: 60 | Fill #0

## 2019-05-16 ENCOUNTER — Telehealth (HOSPITAL_COMMUNITY): Payer: Self-pay | Admitting: Licensed Clinical Social Worker

## 2019-05-16 NOTE — Telephone Encounter (Signed)
CSW called pt to follow up regarding lack of insurance.  Pt reports she lost her insurance back in Oct 2020 when her husband had to stop work.  Pt and husband tried looking into insurance through the [facility] but states there was something prohibiting them because of their income.  Pt reports she has applied for disability and has an MD appt with one of their physicians on Monday to complete an evaluation.  CSW explained that she might also be eligible for Medicaid depending on what her and her husbands income is and if she is deemed disabled.  CSW also discussed with pt how she will be turning 64 in August- pt reports that she has already received information on how to sign up for Medicare and is looking into it further so she will be prepared for when she is eligible.  Pt and spouse have been paying for medications using Goodrx cards and by switching medications to [facility] where most of the medications are cheaper.  CSW discussed HF fund ability to help with some of her medications and pt is agreeable and grateful for this opportunity- CSW had clinic staff send to Outpt pharmacy under HF fund.  Pt also inquired about Sherryll Burger- has been taking 1 pill a day to make her sample supply last.  CSW encouraged pt to start taking as prescribed and call us if she is running low so we can provide more samples while her [employer] application is being processed.  CSW called [employer] who states they didn't receive the fax- CSW found application with fax confirmation attached and faxed again- confirmation received.  CSW will continue to follow and assist as needed  Burna Sis, LCSW Clinical Social Worker [facility] Desk#: [phone] Cell#: [phone]

## 2019-05-24 NOTE — Telephone Encounter (Signed)
Despite successful confirmation, [employer] claims they did not receive her application.  Will resend and follow up.  Archer Asa, CPhT

## 2019-05-28 NOTE — Telephone Encounter (Signed)
Advanced Heart Failure Patient Advocate Encounter   Patient was approved to receive Entresto from [employer]  Patient ID: [id number]  Effective dates: 05/28/2019 through 05/27/2020  Called and spoke with patient provided her phone number to [employer] as well.  Archer Asa, CPhT

## 2019-05-31 ENCOUNTER — Telehealth (HOSPITAL_COMMUNITY): Payer: Self-pay | Admitting: Pharmacy Technician

## 2019-05-31 NOTE — Telephone Encounter (Signed)
Spoke with patient. Although [employer] is approved, the pharmacy does not have her profile ready yet to send our her medication. We will provide her with a 2 week supply on Monday for her to come and pick up.  Archer Asa, CPhT

## 2019-06-07 NOTE — Telephone Encounter (Signed)
Spoke with [employer], patient should get her shipment of medication on Monday.  Archer Asa, CPhT

## 2019-07-03 ENCOUNTER — Telehealth (HOSPITAL_COMMUNITY): Payer: Self-pay | Admitting: Licensed Clinical Social Worker

## 2019-07-03 NOTE — Telephone Encounter (Signed)
CSW consulted to speak with pt regarding lack of insurance and ability to come to clinic for appts.  CSW called pt to discuss.  Informed pt that we would still be able to see patient without insurance but that it would be self pay (there is a self pay discount of 55%).  Pt main concern is her medications and how she will get refills without being seen in clinic.  CSW explained that since we saw her last in August it shouldn't be a problem to continue prescribing until August of this year when she will be enrolled in Medicare and we can set her up with an appt.  Pt reports no new concerning symptoms that need to be addressed and will actually be seeing her PCP this month.  CSW encouraged her to go over all of her medical concerns with her PCP and if she has concerns about her HF to set up an appt with [place] as her health is more important than the clinic charge which can be paid in installments.   Pt expressed understanding and will plan to get Medicare August 1st and set up an appt with our clinic at that time- will reach back out following her PCP appt if her PCP has any concerns about her immediate health.  CSW will continue to follow and assist as needed  Burna Sis, LCSW Clinical Social Worker [facility] Desk#: [phone] Cell#: [phone]

## 2019-07-05 MED FILL — SIMVASTATIN 40 MG TABLET: 40 | 30 days supply | Qty: 30 | Fill #1

## 2019-07-05 MED FILL — FUROSEMIDE 40 MG TAB: 40 | 23 days supply | Qty: 45 | Fill #1

## 2019-07-05 MED FILL — CARVEDILOL 6.25 MG TABLET: 6.25 | 30 days supply | Qty: 60 | Fill #1

## 2019-07-05 MED FILL — SPIRONOLACTONE 25 MG TABS: 25 | 30 days supply | Qty: 30 | Fill #1

## 2019-07-29 ENCOUNTER — Other Ambulatory Visit (HOSPITAL_COMMUNITY): Payer: Self-pay | Admitting: Cardiology

## 2019-07-31 ENCOUNTER — Other Ambulatory Visit (HOSPITAL_COMMUNITY): Payer: Self-pay

## 2019-07-31 ENCOUNTER — Other Ambulatory Visit (HOSPITAL_COMMUNITY): Payer: Self-pay | Admitting: Internal Medicine

## 2019-07-31 ENCOUNTER — Telehealth: Payer: Self-pay

## 2019-07-31 NOTE — Telephone Encounter (Signed)
I told the pt that I will call [employer] to order her a new monitor. I verified her address and phone number and they are correct in the system. I told her I will send a message to dr. Elberta Fortis scheduler and she will give her a call back to set up the appointment. I also told her I will give her a call tomorrow to let her know when I ordered the monitor.

## 2019-07-31 NOTE — Telephone Encounter (Signed)
Monitor ordered 07-31-2019 

## 2019-08-05 ENCOUNTER — Ambulatory Visit (INDEPENDENT_AMBULATORY_CARE_PROVIDER_SITE_OTHER): Payer: Self-pay | Admitting: Cardiology

## 2019-08-05 ENCOUNTER — Other Ambulatory Visit: Payer: Self-pay

## 2019-08-05 ENCOUNTER — Ambulatory Visit (INDEPENDENT_AMBULATORY_CARE_PROVIDER_SITE_OTHER): Payer: Self-pay | Admitting: *Deleted

## 2019-08-05 ENCOUNTER — Encounter: Payer: Self-pay | Admitting: Cardiology

## 2019-08-05 VITALS — BP 118/78 | HR 87 | Ht 59.0 in | Wt 172.0 lb

## 2019-08-05 DIAGNOSIS — I442 Atrioventricular block, complete: Secondary | ICD-10-CM

## 2019-08-05 LAB — CUP PACEART REMOTE DEVICE CHECK
Battery Remaining Longevity: 69 mo
Battery Voltage: 3 V
Brady Statistic AP VP Percent: 4.34 %
Brady Statistic AP VS Percent: 0.01 %
Brady Statistic AS VP Percent: 93.8 %
Brady Statistic AS VS Percent: 1.85 %
Brady Statistic RA Percent Paced: 4.31 %
Brady Statistic RV Percent Paced: 98.07 %
Date Time Interrogation Session: 20210412213817
Implantable Lead Implant Date: 20171024
Implantable Lead Implant Date: 20171024
Implantable Lead Location: 753859
Implantable Lead Location: 753860
Implantable Lead Model: 5076
Implantable Lead Model: 5076
Implantable Pulse Generator Implant Date: 20171024
Lead Channel Impedance Value: 380 Ohm
Lead Channel Impedance Value: 418 Ohm
Lead Channel Impedance Value: 418 Ohm
Lead Channel Impedance Value: 494 Ohm
Lead Channel Pacing Threshold Amplitude: 0.5 V
Lead Channel Pacing Threshold Amplitude: 0.875 V
Lead Channel Pacing Threshold Pulse Width: 0.4 ms
Lead Channel Pacing Threshold Pulse Width: 0.4 ms
Lead Channel Sensing Intrinsic Amplitude: 1.25 mV
Lead Channel Sensing Intrinsic Amplitude: 1.5 mV
Lead Channel Sensing Intrinsic Amplitude: 13.5 mV
Lead Channel Sensing Intrinsic Amplitude: 13.5 mV
Lead Channel Setting Pacing Amplitude: 2 V
Lead Channel Setting Pacing Amplitude: 2.5 V
Lead Channel Setting Pacing Pulse Width: 0.4 ms
Lead Channel Setting Sensing Sensitivity: 2 mV

## 2019-08-05 LAB — CUP PACEART INCLINIC DEVICE CHECK
Date Time Interrogation Session: 20210412163209
Implantable Lead Implant Date: 20171024
Implantable Lead Implant Date: 20171024
Implantable Lead Location: 753859
Implantable Lead Location: 753860
Implantable Lead Model: 5076
Implantable Lead Model: 5076
Implantable Pulse Generator Implant Date: 20171024

## 2019-08-05 MED FILL — SPIRONOLACTONE 25 MG TABS: 25 | 30 days supply | Qty: 30 | Fill #0

## 2019-08-05 MED FILL — SIMVASTATIN 40 MG TABLET: 40 | 30 days supply | Qty: 30 | Fill #0

## 2019-08-05 MED FILL — CARVEDILOL 6.25 MG TABLET: 6.25 | 30 days supply | Qty: 60 | Fill #0

## 2019-08-05 NOTE — Progress Notes (Signed)
Electrophysiology Office Note   Date:  08/05/2019   ID:  Stacy Mcintosh, DOB 09-04-1955, MRN [id number]  PCP:  Dois Davenport, MD  Cardiologist:  Shirlee Latch Primary Electrophysiologist:  Aerie Donica Jorja Loa, MD    No chief complaint on file.    History of Present Illness: Stacy Mcintosh is a 64 y.o. female who presents today for electrophysiology evaluation.   history of hypothyroidism, nonischemic cardiomyopathy, and PE. Presented to heart failure clinic and on routine EKG was found to be in heart block with a right bundle branch block morphology. She has a history of left bundle branch block in the past. She had a Medtronic dual chamber pacemaker implanted 02/16/16.  She is RV pacing 99% of the time.  Her ejection fraction has since fallen to 45%.  Myoview shows a possible apical wall motion abnormality.  She has plans for left heart catheterization.  Her ejection fraction on her Myoview was 58%.  Today, denies symptoms of palpitations, chest pain, shortness of breath, orthopnea, PND, lower extremity edema, claudication, dizziness, presyncope, syncope, bleeding, or neurologic sequela. The patient is tolerating medications without difficulties.  Main complaint today is of fatigue.  She says that she feels unable to do some of her daily activities.  She feels that she cannot walk long distances due to her fatigue.  Of note she has had quite a stressful time over the last few months to years, with deaths in the family and a difficult marriage.  Past Medical History:  Diagnosis Date  . Anxiety   . Arthritis    "lower back" (02/16/2016)  . BUNDLE BRANCH BLOCK, LEFT 11/12/2008  . CARDIOMYOPATHY 11/12/2008  . CHF (congestive heart failure) (HCC)    a.Echo 5/17: EF 35-40%, moderate MR, mild LAE, mild to moderate TR, PASP 50 mmHg  . Chronic lower back pain   . Depression   . DVT (deep venous thrombosis) (HCC) 2008   LLE  . Graves' disease    S/P radiation; "now up/down"  . HYPERTENSION 10/16/2007   . Hypothyroidism    h/o Graves Disease  . Migraine    "controlled w/piercings" (02/16/2016)  . Other and unspecified hyperlipidemia 01/19/2010  . PE (pulmonary embolism) 2008   Factor V negative  . Presence of permanent cardiac pacemaker   . Pure hypercholesterolemia 11/18/2008   Past Surgical History:  Procedure Laterality Date  . ABDOMINAL HYSTERECTOMY  1990s  . CARDIAC CATHETERIZATION  2009?  . CESAREAN SECTION  1974; 198j0; 1987   x3  . EP IMPLANTABLE DEVICE N/A 02/16/2016   Procedure: Pacemaker Implant;  Surgeon: Toniqua Melamed Jorja Loa, MD;  Location: [facility] INVASIVE CV LAB;  Service: Cardiovascular;  Laterality: N/A;  . HERNIA REPAIR  1956  . INSERT / REPLACE / REMOVE PACEMAKER    . RIGHT/LEFT HEART CATH AND CORONARY ANGIOGRAPHY N/A 09/08/2017   Procedure: RIGHT/LEFT HEART CATH AND CORONARY ANGIOGRAPHY;  Surgeon: Laurey Morale, MD;  Location: [facility] INVASIVE CV LAB;  Service: Cardiovascular;  Laterality: N/A;     Current Outpatient Medications  Medication Sig Dispense Refill  . ALPRAZolam (XANAX) 0.5 MG tablet Take 1 tablet (0.5 mg total) by mouth 3 (three) times daily as needed for anxiety. 30 tablet 0  . aspirin EC 81 MG tablet Take 81 mg by mouth daily.     . carvedilol (COREG) 6.25 MG tablet Take 1 tablet (6.25 mg total) by mouth 2 (two) times daily with a meal. 180 tablet 0  . clonazePAM (KLONOPIN) 1 MG tablet Take 1 mg  by mouth at bedtime as needed for anxiety.     Marland Kitchen doxepin (SINEQUAN) 10 MG capsule Take 20 mg by mouth at bedtime.     Marland Kitchen ENTRESTO 49-51 MG TAKE 1 TABLET BY MOUTH TWICE A DAY 180 tablet 3  . furosemide (LASIX) 40 MG tablet TAKE 1 TABLET (40 MG TOTAL) BY MOUTH DAILY. MAY TAKE AN ADDITIONAL 40MG  DAILY FOR FLUID. 115 tablet 0  . pantoprazole (PROTONIX) 40 MG tablet Take 1 tablet (40 mg total) by mouth daily. 90 tablet 3  . sacubitril-valsartan (ENTRESTO) 49-51 MG Take 1 tablet by mouth 2 (two) times daily.    . simvastatin (ZOCOR) 40 MG tablet Take 1 tablet (40 mg  total) by mouth daily at 6 PM. 90 tablet 0  . spironolactone (ALDACTONE) 25 MG tablet Take 1 tablet (25 mg total) by mouth daily. Needs appt for further refills 90 tablet 0  . thyroid (ARMOUR THYROID) 60 MG tablet TAKE 1 TABLET (60 MG TOTAL) BY MOUTH DAILY BEFORE BREAKFAST. 90 tablet 3  . tiZANidine (ZANAFLEX) 4 MG tablet Take 4 mg by mouth 3 (three) times daily as needed. 1/2tqb prn  6  . VENTOLIN HFA 108 (90 Base) MCG/ACT inhaler INHALE 1 TO 2 PUFFS EVERY 4 6 HOURS AS NEEDED FOR WHEEZING.    . Vitamin D, Ergocalciferol, (DRISDOL) 1.25 MG (50000 UNIT) CAPS capsule Take 50,000 Units by mouth every 7 (seven) days.     No current facility-administered medications for this visit.    Allergies:   Augmentin [amoxicillin-pot clavulanate], Lisinopril, Prednisone, Symbicort [budesonide-formoterol fumarate], and Azithromycin   Social History:  The patient  reports that she has never smoked. She has never used smokeless tobacco. She reports current alcohol use. She reports that she does not use drugs.   Family History:  The patient's family history includes Arthritis in her sister; Diabetes in her brother; Heart attack in her brother and mother; Hypertension in her mother and sister; Ovarian cancer in her mother; Prostate cancer in her father; Rheumatologic disease in her mother; Sudden death in her maternal grandfather; Thyroid disease in her brother.    ROS:  Please see the history of present illness.   Otherwise, review of systems is positive for none.   All other systems are reviewed and negative.   PHYSICAL EXAM: VS:  BP 118/78   Pulse 87   Ht 4\' 11"  (1.499 m)   Wt 172 lb (78 kg)   SpO2 97%   BMI 34.74 kg/m  , BMI Body mass index is 34.74 kg/m. GEN: Well nourished, well developed, in no acute distress  HEENT: normal  Neck: no JVD, carotid bruits, or masses Cardiac: RRR; no murmurs, rubs, or gallops,no edema  Respiratory:  clear to auscultation bilaterally, normal work of breathing GI:  soft, nontender, nondistended, + BS MS: no deformity or atrophy  Skin: warm and dry, device site well healed Neuro:  Strength and sensation are intact Psych: euthymic mood, full affect  EKG:  EKG is ordered today. Personal review of the ekg ordered shows SR, V paced  Personal review of the device interrogation today. Results in [facility]: 10/15/2018: BUN 17; Creatinine, Ser 1.18; Potassium 3.8; Sodium 136; TSH 0.881    Lipid Panel     Component Value Date/Time   CHOL 195 09/17/2015 1301   TRIG 91 09/17/2015 1301   HDL 72 09/17/2015 1301   CHOLHDL 2.7 09/17/2015 1301   VLDL 18 09/17/2015 1301   LDLCALC 105 (H) 09/17/2015 1301  LDLDIRECT 140.8 04/23/2012 1011     Wt Readings from Last 3 Encounters:  08/05/19 172 lb (78 kg)  11/29/18 168 lb 3.2 oz (76.3 kg)  10/02/18 152 lb (68.9 kg)      Other studies Reviewed: Additional studies/ records that were reviewed today include: TTE 01/01/19 Review of the above records today demonstrates:  1. Trivial pericardial effusion is present.  2. The aortic root is normal in size and structure.  3. The aortic valve is tricuspid. No stenosis of the aortic valve.  4. The left ventricle has a visually estimated ejection fraction of 55%.  The cavity size was normal. Left ventricular diastolic Doppler parameters  are consistent with impaired relaxation. No evidence of left ventricular  regional wall motion  abnormalities.  5. No evidence of mitral valve stenosis. Trivial mitral regurgitation.  6. The interatrial septum was not assessed.  7. The right ventricle has normal systolic function. The cavity was  mildly enlarged. There is no increase in right ventricular wall thickness.  8. Normal IVC size. PA systolic pressure 35 mmHg.   Myoview 03/06/17  Nuclear stress EF: 58% with apical akinesis.  There was no ST segment deviation noted during stress.  There is a small defect of mild severity present in the apex  location. The defect is non-reversible and consistent with infarct. No ischemia noted.  This is a low risk study.  The left ventricular ejection fraction is normal (55-65%).  ASSESSMENT AND PLAN:  1.  Complete AV block: Status post Medtronic dual-chamber pacemaker implanted October 2017.  Device functioning appropriately.  Fortunately her ejection fraction has remained stable at 55%.  If it does drop, she would likely benefit from device upgrade.    2. Nonischemic cardiomyopathy: EF 45 to 30% at most recent check.  No changes.  Labs/ tests ordered today include:  Orders Placed This Encounter  Procedures  . EKG 12-Lead    Disposition:   FU with Mckinnon Glick 12 months  Signed, Idania Desouza Jorja Loa, MD  08/05/2019 3:17 PM     [facility] [address] [place] [address] [phone] (office) [phone] (fax)

## 2019-08-05 NOTE — Patient Instructions (Addendum)
Medication Instructions:  Your physician recommends that you continue on your current medications as directed. Please refer to the Current Medication list given to you today.  *If you need a refill on your cardiac medications before your next appointment, please call your pharmacy*   Lab Work: None ordered If you have labs (blood work) drawn today and your tests are completely normal, you will receive your results only by: Marland Kitchen MyChart Message (if you have MyChart) OR . A paper copy in the mail If you have any lab test that is abnormal or we need to change your treatment, we will call you to review the results.   Testing/Procedures: None ordered   Follow-Up: Remote monitoring is used to monitor your Pacemaker of ICD from home. This monitoring reduces the number of office visits required to check your device to one time per year. It allows [place] to keep an eye on the functioning of your device to ensure it is working properly. You are scheduled for a device check from home on 11/04/2019. You may send your transmission at any time that day. If you have a wireless device, the transmission will be sent automatically. After your physician reviews your transmission, you will receive a postcard with your next transmission date.  At [facility], you and your health needs are our priority.  As part of our continuing mission to provide you with exceptional heart care, we have created designated Provider Care Teams.  These Care Teams include your primary Cardiologist (physician) and Advanced Practice Providers (APPs -  Physician Assistants and Nurse Practitioners) who all work together to provide you with the care you need, when you need it.  We recommend signing up for the patient portal called "MyChart".  Sign up information is provided on this After Visit Summary.  MyChart is used to connect with patients for Virtual Visits (Telemedicine).  Patients are able to view lab/test results, encounter notes,  upcoming appointments, etc.  Non-urgent messages can be sent to your provider as well.   To learn more about what you can do with MyChart, go to [email or web address].    Your next appointment:   1 year(s)  The format for your next appointment:   In Person  Provider:   Loman Brooklyn, MD   Thank you for choosing [facility]!!   Dory Horn, RN [phone]    Other Instructions   Heart failure clinic will contact you to arrange follow up with Dr. Shirlee Latch

## 2019-08-06 NOTE — Progress Notes (Signed)
PPM Remote  

## 2019-09-18 MED FILL — CARVEDILOL 6.25 MG TABLET: 6.25 | 30 days supply | Qty: 60 | Fill #1

## 2019-10-07 ENCOUNTER — Telehealth: Payer: Self-pay

## 2019-10-07 NOTE — Telephone Encounter (Signed)
Okay to hold aspirin for endoscopy.

## 2019-10-07 NOTE — Telephone Encounter (Signed)
   [facility] Pre-operative Risk Assessment    HEARTCARE STAFF: - Please ensure there is not already an duplicate clearance open for this procedure. - Under Visit Info/Reason for Call, type in Other and utilize the format Clearance MM/DD/YY or Clearance TBD. Do not use dashes or single digits. - If request is for dental extraction, please clarify the # of teeth to be extracted.  Request for surgical clearance:  1. What type of surgery is being performed? EGD   2. When is this surgery scheduled? 10/08/19   3. What type of clearance is required (medical clearance vs. Pharmacy clearance to hold med vs. Both)? MEDICAL   4. Are there any medications that need to be held prior to surgery and how long? ASA PLEASE ADVISE   5. Practice name and name of physician performing surgery? [facility]. What is the office phone number? [phone]   7.   What is the office fax number? [phone]  8.   Anesthesia type (None, local, MAC, general) ? NONE LISTED   Stacy Mcintosh 10/07/2019, 11:12 AM  _________________________________________________________________   (provider comments below)

## 2019-10-07 NOTE — Telephone Encounter (Signed)
   Primary Cardiologist: Will Jorja Loa, MD  Chart reviewed as part of pre-operative protocol coverage. Given past medical history and time since last visit, based on ACC/AHA guidelines, Stacy Mcintosh would be at acceptable risk for the planned procedure without further cardiovascular testing.   She may hold aspirin for 7 days prior to her endoscopy.  Please resume as soon as hemostasis is achieved.  I will route this recommendation to the requesting party via Epic fax function and remove from pre-op pool.  Please call with questions.  Thomasene Ripple. Micayla Brathwaite NP-C    10/07/2019, 4:15 PM [facility] [address] Office [phone] Fax [phone]

## 2019-11-11 MED FILL — SPIRONOLACTONE 25 MG TABS: 25 | 30 days supply | Qty: 30 | Fill #1

## 2019-11-11 MED FILL — CARVEDILOL 6.25 MG TABLET: 6.25 | 30 days supply | Qty: 60 | Fill #2

## 2019-11-11 MED FILL — FUROSEMIDE 40 MG TAB: 40 | 30 days supply | Qty: 35 | Fill #0

## 2019-11-14 MED FILL — SIMVASTATIN 40 MG TABLET: 40 | 30 days supply | Qty: 30 | Fill #1

## 2019-12-23 ENCOUNTER — Ambulatory Visit (INDEPENDENT_AMBULATORY_CARE_PROVIDER_SITE_OTHER): Payer: Medicare HMO | Admitting: Family Medicine

## 2019-12-23 ENCOUNTER — Encounter: Payer: Self-pay | Admitting: Family Medicine

## 2019-12-23 ENCOUNTER — Other Ambulatory Visit: Payer: Self-pay

## 2019-12-23 DIAGNOSIS — M5441 Lumbago with sciatica, right side: Secondary | ICD-10-CM | POA: Diagnosis not present

## 2019-12-23 DIAGNOSIS — G8929 Other chronic pain: Secondary | ICD-10-CM

## 2019-12-23 MED ORDER — GABAPENTIN 100 MG PO CAPS: ORAL_CAPSULE | ORAL | 3 refills | Status: DC

## 2019-12-23 NOTE — Progress Notes (Signed)
I saw and examined the patient with Dr. Marga Hoots and agree with assessment and plan as outlined.    Former patient of mine, used to work at [employer].  Chronic LBP with right-sided radicular pain.  Has done well with periodic ESI, last was 2020 per Dr. Estella Husk.  Flared up about 3 months ago.  Similar pain pattern to prior.  No incontinence, no red flags.  MRI in 2016 showed severe L5-S1 facet DJD with biforaminal stenosis.  Will try gabapentin and will refer for another ESI.

## 2019-12-23 NOTE — Progress Notes (Signed)
Office Visit Note   Patient: Stacy Mcintosh           Date of Birth: March 09, 1956           MRN: [id number] Visit Date: 12/23/2019 Requested by: Dois Davenport, MD [address] [place],  [place] [address] PCP: Dois Davenport, MD  Subjective: Chief Complaint  Patient presents with  . Lower Back - Pain    Chronic pain. Right foot has been cramping up and dragging. Having spasms in the back. Not been able to have ESIs in a while due to loss of insurance. Sees an accupuncturist when she can afford it.     HPI: 64yo F presenting to clinic today with concerns of subacute on chronic low back pain with right leg radiculopathy and weakness. Patient states that she has been seen for this previously, and was getting ESIs performed, which significantly improved her symptoms- however she lost her insurance and was lost to follow up. She now has medicare, and she is hoping to once again begin this therapy. Patient states that her lower back pain is primarily on the right side, and will radiate into her right glutes. She describes sensations that it will 'lock up' on her, in a tight spasm, which will occasionally travel down her leg ad cause her right foot to spasm. She also describes feeling that her right foot will get 'foot drop,' and cause trouble with her walking. She describes that there are times this is so bad she needs to use a walker to feel safe with ambulation. She is also followed by Neurology for these concerns.               ROS:   All other systems were reviewed and are negative.  Objective: Vital Signs: There were no vitals taken for this visit.  Physical Exam:  General:  Alert and oriented, in no acute distress. Pulm:  Breathing unlabored. Psy:  Normal mood, congruent affect. Skin:  No rashes or bruising.   Normal gait. No apparently leg weakness with ambulation. Normal foot dorsiflexion.  Normal spinal curvature.  Able to achieve toe-touch, with minimal pain. No pain  with lumbar extension. Endorses pain with bilateral rotation, sidebending.  Lower extremity strength with variable effort on right, though able to achieve moments of 5/5 strength throughout knee flexion/extension, ankle dorsi/plantarflexion, inversion/eversion.  5/5 strength throughout LLE testing Subjectively decreased sensation to light touch in medial aspect of right leg, dorsal aspect of R foot, posterior aspect of R foot, and plantar aspect of R foot.  Sensation intact throughout LLE.  2+ patellar and achilles reflexes bilaterally.  Supine exam:  Negative SLR Bilaterally. TTP to anterior hip flexor tenderpoints.  FABERS causes SI Pain on right.  Prone exam:  TTP throughout lower lumbarparaspinal muscles, R>>L, as well as at bilateral SI Joints, R>>L. Tenderness throughout R gluteal musculature.    Imaging: No results found.  Assessment & Plan: 64yo F with history of chronic lower back pain, subjective RLE weakness, presenting to clinic with concerns of worsening of her symptoms over the past 2-3 months. Patient would like to try another ESI, as this has improved her symptoms in the past.  -Will place referral to Dr Alvester Morin for consideration of ESI injections -Offered systemic steroid therapy for acute pain, radiculopathy, however patient declines. Has tizanidine already prescribed by PCM.  -Return precautions discussed, referral placed. Patient is agreeable with assessment and plan.      Procedures: No procedures performed  No notes on file     PMFS History: Patient Active Problem List   Diagnosis Date Noted  . CKD (chronic kidney disease) stage 2, GFR 60-89 ml/min 02/08/2018  . Gastroesophageal reflux disease without esophagitis 02/07/2018  . MDD (major depressive disorder), recurrent episode, moderate (HCC) 02/07/2018  . Complete heart block (HCC) 02/16/2016  . Cough 10/01/2015  . Chronic systolic CHF (congestive heart failure) (HCC) 09/18/2015  . Postmenopausal  08/11/2015  . History of Lyme disease 08/11/2015  . Nonischemic cardiomyopathy (HCC) 03/04/2014  . Depression 02/27/2014  . 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 History of pulmonary embolism 01/25/2011  . Pulmonary embolism (HCC) 01/25/2011  . Hypothyroidism 10/28/2010  . 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 OTHER AND UNSPECIFIED HYPERLIPIDEMIA 01/19/2010  . PURE HYPERCHOLESTEROLEMIA 11/18/2008  . CARDIOMYOPATHY 11/12/2008  . LBBB (left bundle branch block) 11/12/2008  . Essential hypertension 10/16/2007  . HEADACHE, CHRONIC 10/16/2007   Past Medical History:  Diagnosis Date  . Anxiety   . Arthritis    "lower back" (02/16/2016)  . BUNDLE BRANCH BLOCK, LEFT 11/12/2008  . CARDIOMYOPATHY 11/12/2008  . CHF (congestive heart failure) (HCC)    a.Echo 5/17: EF 35-40%, moderate MR, mild LAE, mild to moderate TR, PASP 50 mmHg  . Chronic lower back pain   . Depression   . DVT (deep venous thrombosis) (HCC) 2008   LLE  . Graves' disease    S/P radiation; "now up/down"  . HYPERTENSION 10/16/2007  . Hypothyroidism    h/o Graves Disease  . Migraine    "controlled w/piercings" (02/16/2016)  . Other and unspecified hyperlipidemia 01/19/2010  . PE (pulmonary embolism) 2008   Factor V negative  . Presence of permanent cardiac pacemaker   . Pure hypercholesterolemia 11/18/2008    Family History  Problem Relation Age of Onset  . Heart attack Mother   . Hypertension Mother   . Rheumatologic disease Mother   . Ovarian cancer Mother   . Prostate cancer Father   . Arthritis Sister   . Hypertension Sister   . Thyroid disease Brother        Parathyroid problem  . Diabetes Brother   . Heart attack Brother   . Sudden death Maternal Grandfather     Past Surgical History:  Procedure Laterality Date  . ABDOMINAL HYSTERECTOMY  1990s  . CARDIAC CATHETERIZATION  2009?  . CESAREAN SECTION  1974; 198j0; 1987   x3  . EP IMPLANTABLE DEVICE N/A 02/16/2016   Procedure: Pacemaker  Implant;  Surgeon: Will Jorja Loa, MD;  Location: [facility] INVASIVE CV LAB;  Service: Cardiovascular;  Laterality: N/A;  . HERNIA REPAIR  1956  . INSERT / REPLACE / REMOVE PACEMAKER    . RIGHT/LEFT HEART CATH AND CORONARY ANGIOGRAPHY N/A 09/08/2017   Procedure: RIGHT/LEFT HEART CATH AND CORONARY ANGIOGRAPHY;  Surgeon: Laurey Morale, MD;  Location: [facility] INVASIVE CV LAB;  Service: Cardiovascular;  Laterality: N/A;   Social History   Occupational History  . Occupation: Massage Therapist  Tobacco Use  . Smoking status: Never Smoker  . Smokeless tobacco: Never Used  Vaping Use  . Vaping Use: Never used  Substance and Sexual Activity  . Alcohol use: Yes    Alcohol/week: 0.0 standard drinks    Comment: 02/16/2016 "glass of wine a couple days/month"  . Drug use: No  . Sexual activity: Not Currently

## 2019-12-24 ENCOUNTER — Other Ambulatory Visit (HOSPITAL_COMMUNITY): Payer: Self-pay | Admitting: Cardiology

## 2019-12-24 MED FILL — SPIRONOLACTONE 25 MG TABS: 25 | 30 days supply | Qty: 30 | Fill #2

## 2019-12-24 MED FILL — SIMVASTATIN 40 MG TABLET: 40 | 30 days supply | Qty: 30 | Fill #2

## 2019-12-25 ENCOUNTER — Other Ambulatory Visit (HOSPITAL_COMMUNITY): Payer: Self-pay | Admitting: *Deleted

## 2019-12-25 MED FILL — CARVEDILOL 6.25 MG TABLET: 6.25 | 30 days supply | Qty: 60 | Fill #0

## 2019-12-25 NOTE — Telephone Encounter (Signed)
Pt left VM stating she needed refills and also had an issue with a "bubble" where her device is implanted. I called pt back to get more information no answer/left vm requesting return call.

## 2019-12-27 ENCOUNTER — Telehealth (HOSPITAL_COMMUNITY): Payer: Self-pay

## 2019-12-27 NOTE — Telephone Encounter (Signed)
Pt called to schedule an appointment.  Appt scheduled for October 22nd.

## 2020-01-06 ENCOUNTER — Other Ambulatory Visit: Payer: Self-pay

## 2020-01-06 ENCOUNTER — Ambulatory Visit: Payer: Self-pay

## 2020-01-06 ENCOUNTER — Ambulatory Visit (INDEPENDENT_AMBULATORY_CARE_PROVIDER_SITE_OTHER): Payer: Medicare HMO | Admitting: Physical Medicine and Rehabilitation

## 2020-01-06 ENCOUNTER — Encounter: Payer: Self-pay | Admitting: Physical Medicine and Rehabilitation

## 2020-01-06 VITALS — BP 153/79 | HR 86

## 2020-01-06 DIAGNOSIS — M5416 Radiculopathy, lumbar region: Secondary | ICD-10-CM

## 2020-01-06 MED ORDER — METHYLPREDNISOLONE ACETATE 80 MG/ML IJ SUSP
40.0000 mg | Freq: Once | INTRAMUSCULAR | Status: AC
  Administered 2020-01-06: 40 mg

## 2020-01-06 NOTE — Progress Notes (Signed)
Stacy Mcintosh - 64 y.o. female MRN [id number]  Date of birth: Mar 19, 1956  Office Visit Note: Visit Date: 01/06/2020 PCP: Dois Davenport, MD Referred by: Dois Davenport, MD  Subjective: Chief Complaint  Patient presents with  . Lower Back - Pain   HPI: Stacy Mcintosh is a 64 y.o. female who comes in today at the request of Dr. Lavada Mesi for planned Right L5-S1 Lumbar epidural steroid injection with fluoroscopic guidance.  The patient has failed conservative care including home exercise, medications, time and activity modification.  This injection will be diagnostic and hopefully therapeutic.  Please see requesting physician notes for further details and justification.   MRI from 2016 reviewed. Depending on results with injection consider facet block or new lumbar spine MRI   ROS Otherwise per HPI.  Assessment & Plan: Visit Diagnoses:  1. Lumbar radiculopathy     Plan: No additional findings.   Meds & Orders:  Meds ordered this encounter  Medications  . methylPREDNISolone acetate (DEPO-MEDROL) injection 40 mg    Orders Placed This Encounter  Procedures  . XR C-ARM NO REPORT  . Epidural Steroid injection    Follow-up: Return if symptoms worsen or fail to improve.   Procedures: No procedures performed  Lumbar Epidural Steroid Injection - Interlaminar Approach with Fluoroscopic Guidance  Patient: Stacy Mcintosh      Date of Birth: [birth year] MRN: [id number] PCP: Dois Davenport, MD      Visit Date: 01/06/2020   Universal Protocol:     Consent Given By: the patient  Position: PRONE  Additional Comments: Vital signs were monitored before and after the procedure. Patient was prepped and draped in the usual sterile fashion. The correct patient, procedure, and site was verified.   Injection Procedure Details:  Procedure Site One Meds Administered:  Meds ordered this encounter  Medications  . methylPREDNISolone acetate (DEPO-MEDROL) injection 40 mg      Laterality: Right  Location/Site:  L5-S1  Needle size: 20 G  Needle type: Tuohy  Needle Placement: Paramedian epidural  Findings:   -Comments: Excellent flow of contrast into the epidural space.  Procedure Details: Using a paramedian approach from the side mentioned above, the region overlying the inferior lamina was localized under fluoroscopic visualization and the soft tissues overlying this structure were infiltrated with 4 ml. of 1% Lidocaine without Epinephrine. The Tuohy needle was inserted into the epidural space using a paramedian approach.   The epidural space was localized using loss of resistance along with lateral and bi-planar fluoroscopic views.  After negative aspirate for air, blood, and CSF, a 2 ml. volume of Isovue-250 was injected into the epidural space and the flow of contrast was observed. Radiographs were obtained for documentation purposes.    The injectate was administered into the level noted above.   Additional Comments:  The patient tolerated the procedure well Dressing: 2 x 2 sterile gauze and Band-Aid    Post-procedure details: Patient was observed during the procedure. Post-procedure instructions were reviewed.  Patient left the clinic in stable condition.     Clinical History: Lumbar radiculopathy.  Low back pain.  EXAM: MRI LUMBAR SPINE WITHOUT CONTRAST  TECHNIQUE: Multiplanar, multisequence MR imaging of the lumbar spine was performed. No intravenous contrast was administered.  COMPARISON:  Radiographs dated 08/17/2009  FINDINGS: Normal conus tip at L1. L5 vertebra is sacralized. Therefore, the lowest open interspace is L4-5.  Paraspinal soft tissues are normal.  T9-10 through L1-2:  Normal.  L2-3: Tiny disc bulges into  the neural foramina with no neural impingement. Minimal degenerative changes of the facet joints.  L3-4: Small disc bulges into the neural foramina, left more than right with no neural  impingement. Slight hypertrophy of the ligamentum flavum. No spinal stenosis.  L4-5: 2 mm spondylolisthesis with a small broad-based bulge of the uncovered disc. Severe right and moderate left facet arthritis with bilateral foraminal stenosis. This could affect either or both L4 nerves. There is edema in and around the facet joints.  L5-S1: Tiny vestigial disc. 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  IMPRESSION: 1. Severe right and moderately severe left facet arthritis at L5-S1 with bilateral foraminal stenosis and grade 1 spondylolisthesis of L4 on L5 with edema in and around the facet joints. The foraminal stenosis could affect either or both L4 nerves.   Electronically Signed   By: Francene Boyers M.D.   On: 02/12/2015 15:30   She reports that she has never smoked. She has never used smokeless tobacco. No results for input(s): HGBA1C, LABURIC in the last 8760 hours.  Objective:  VS:  HT:    WT:   BMI:     BP:(!) 153/79  HR:86bpm  TEMP: ( )  RESP:  Physical Exam Constitutional:      General: She is not in acute distress.    Appearance: Normal appearance. She is not ill-appearing.  HENT:     Head: Normocephalic and atraumatic.     Right Ear: External ear normal.     Left Ear: External ear normal.  Eyes:     Extraocular Movements: Extraocular movements intact.  Cardiovascular:     Rate and Rhythm: Normal rate.     Pulses: Normal pulses.  Musculoskeletal:     Right lower leg: No edema.     Left lower leg: No edema.     Comments: Patient has good distal strength with no pain over the greater trochanters.  No clonus or focal weakness.  Skin:    Findings: No erythema, lesion or rash.  Neurological:     General: No focal deficit present.     Mental Status: She is alert and oriented to person, place, and time.     Sensory: No sensory deficit.     Motor: No weakness or abnormal muscle tone.     Coordination: Coordination normal.  Psychiatric:         Mood and Affect: Mood normal.        Behavior: Behavior normal.     Ortho Exam  Imaging: XR C-ARM NO REPORT  Result Date: 01/06/2020 Please see Notes tab for imaging impression.   Past Medical/Family/Surgical/Social History: Medications & Allergies reviewed per EMR, new medications updated. Patient Active Problem List   Diagnosis Date Noted  . CKD (chronic kidney disease) stage 2, GFR 60-89 ml/min 02/08/2018  . Gastroesophageal reflux disease without esophagitis 02/07/2018  . MDD (major depressive disorder), recurrent episode, moderate (HCC) 02/07/2018  . Complete heart block (HCC) 02/16/2016  . Cough 10/01/2015  . Chronic systolic CHF (congestive heart failure) (HCC) 09/18/2015  . Postmenopausal 08/11/2015  . History of Lyme disease 08/11/2015  . Nonischemic cardiomyopathy (HCC) 03/04/2014  . Depression 02/27/2014  . 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 History of pulmonary embolism 01/25/2011  . Pulmonary embolism (HCC) 01/25/2011  . Hypothyroidism 10/28/2010  . 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 OTHER AND UNSPECIFIED HYPERLIPIDEMIA 01/19/2010  . PURE HYPERCHOLESTEROLEMIA 11/18/2008  . CARDIOMYOPATHY 11/12/2008  . LBBB (left bundle branch block) 11/12/2008  . Essential hypertension 10/16/2007  .  HEADACHE, CHRONIC 10/16/2007   Past Medical History:  Diagnosis Date  . Anxiety   . Arthritis    "lower back" (02/16/2016)  . BUNDLE BRANCH BLOCK, LEFT 11/12/2008  . CARDIOMYOPATHY 11/12/2008  . CHF (congestive heart failure) (HCC)    a.Echo 5/17: EF 35-40%, moderate MR, mild LAE, mild to moderate TR, PASP 50 mmHg  . Chronic lower back pain   . Depression   . DVT (deep venous thrombosis) (HCC) 2008   LLE  . Graves' disease    S/P radiation; "now up/down"  . HYPERTENSION 10/16/2007  . Hypothyroidism    h/o Graves Disease  . Migraine    "controlled w/piercings" (02/16/2016)  . Other and unspecified hyperlipidemia 01/19/2010  . PE (pulmonary embolism)  2008   Factor V negative  . Presence of permanent cardiac pacemaker   . Pure hypercholesterolemia 11/18/2008   Family History  Problem Relation Age of Onset  . Heart attack Mother   . Hypertension Mother   . Rheumatologic disease Mother   . Ovarian cancer Mother   . Prostate cancer Father   . Arthritis Sister   . Hypertension Sister   . Thyroid disease Brother        Parathyroid problem  . Diabetes Brother   . Heart attack Brother   . Sudden death Maternal Grandfather    Past Surgical History:  Procedure Laterality Date  . ABDOMINAL HYSTERECTOMY  1990s  . CARDIAC CATHETERIZATION  2009?  . CESAREAN SECTION  1974; 198j0; 1987   x3  . EP IMPLANTABLE DEVICE N/A 02/16/2016   Procedure: Pacemaker Implant;  Surgeon: Will Jorja Loa, MD;  Location: [facility] INVASIVE CV LAB;  Service: Cardiovascular;  Laterality: N/A;  . HERNIA REPAIR  1956  . INSERT / REPLACE / REMOVE PACEMAKER    . RIGHT/LEFT HEART CATH AND CORONARY ANGIOGRAPHY N/A 09/08/2017   Procedure: RIGHT/LEFT HEART CATH AND CORONARY ANGIOGRAPHY;  Surgeon: Laurey Morale, MD;  Location: [facility] INVASIVE CV LAB;  Service: Cardiovascular;  Laterality: N/A;   Social History   Occupational History  . Occupation: Massage Therapist  Tobacco Use  . Smoking status: Never Smoker  . Smokeless tobacco: Never Used  Vaping Use  . Vaping Use: Never used  Substance and Sexual Activity  . Alcohol use: Yes    Alcohol/week: 0.0 standard drinks    Comment: 02/16/2016 "glass of wine a couple days/month"  . Drug use: No  . Sexual activity: Not Currently

## 2020-01-06 NOTE — Procedures (Signed)
Lumbar Epidural Steroid Injection - Interlaminar Approach with Fluoroscopic Guidance  Patient: Stacy Mcintosh      Date of Birth: 04/04/1956 MRN: [id number] PCP: Dois Davenport, MD      Visit Date: 01/06/2020   Universal Protocol:     Consent Given By: the patient  Position: PRONE  Additional Comments: Vital signs were monitored before and after the procedure. Patient was prepped and draped in the usual sterile fashion. The correct patient, procedure, and site was verified.   Injection Procedure Details:  Procedure Site One Meds Administered:  Meds ordered this encounter  Medications  . methylPREDNISolone acetate (DEPO-MEDROL) injection 40 mg     Laterality: Right  Location/Site:  L5-S1  Needle size: 20 G  Needle type: Tuohy  Needle Placement: Paramedian epidural  Findings:   -Comments: Excellent flow of contrast into the epidural space.  Procedure Details: Using a paramedian approach from the side mentioned above, the region overlying the inferior lamina was localized under fluoroscopic visualization and the soft tissues overlying this structure were infiltrated with 4 ml. of 1% Lidocaine without Epinephrine. The Tuohy needle was inserted into the epidural space using a paramedian approach.   The epidural space was localized using loss of resistance along with lateral and bi-planar fluoroscopic views.  After negative aspirate for air, blood, and CSF, a 2 ml. volume of Isovue-250 was injected into the epidural space and the flow of contrast was observed. Radiographs were obtained for documentation purposes.    The injectate was administered into the level noted above.   Additional Comments:  The patient tolerated the procedure well Dressing: 2 x 2 sterile gauze and Band-Aid    Post-procedure details: Patient was observed during the procedure. Post-procedure instructions were reviewed.  Patient left the clinic in stable condition.

## 2020-01-06 NOTE — Progress Notes (Signed)
Pt state lower back pain and right buttock pain. Pt state walking to far makes the pain worse. Pt state icing and meds helps with the pain.   Numeric Pain Rating Scale and Functional Assessment Average Pain 8   In the last MONTH (on 0-10 scale) has pain interfered with the following?  1. General activity like being  able to carry out your everyday physical activities such as walking, climbing stairs, carrying groceries, or moving a chair?  Rating(10)   +Driver, -BT, -Dye Allergies.

## 2020-01-20 ENCOUNTER — Other Ambulatory Visit (HOSPITAL_COMMUNITY): Payer: Self-pay | Admitting: Cardiology

## 2020-01-23 MED FILL — SIMVASTATIN 40 MG TABLET: 40 | 30 days supply | Qty: 30 | Fill #0

## 2020-01-23 MED FILL — SPIRONOLACTONE 25 MG TABS: 25 | 30 days supply | Qty: 30 | Fill #0

## 2020-01-23 MED FILL — CARVEDILOL 6.25 MG TABLET: 6.25 | 30 days supply | Qty: 60 | Fill #0

## 2020-02-04 ENCOUNTER — Ambulatory Visit (INDEPENDENT_AMBULATORY_CARE_PROVIDER_SITE_OTHER): Payer: Medicare HMO

## 2020-02-04 DIAGNOSIS — I442 Atrioventricular block, complete: Secondary | ICD-10-CM | POA: Diagnosis not present

## 2020-02-06 LAB — CUP PACEART REMOTE DEVICE CHECK
Battery Remaining Longevity: 60 mo
Battery Voltage: 3 V
Brady Statistic AP VP Percent: 2.81 %
Brady Statistic AP VS Percent: 0 %
Brady Statistic AS VP Percent: 96.48 %
Brady Statistic AS VS Percent: 0.7 %
Brady Statistic RA Percent Paced: 2.8 %
Brady Statistic RV Percent Paced: 99.22 %
Date Time Interrogation Session: 20211013234141
Implantable Lead Implant Date: 20171024
Implantable Lead Implant Date: 20171024
Implantable Lead Location: 753859
Implantable Lead Location: 753860
Implantable Lead Model: 5076
Implantable Lead Model: 5076
Implantable Pulse Generator Implant Date: 20171024
Lead Channel Impedance Value: 380 Ohm
Lead Channel Impedance Value: 399 Ohm
Lead Channel Impedance Value: 418 Ohm
Lead Channel Impedance Value: 475 Ohm
Lead Channel Pacing Threshold Amplitude: 0.375 V
Lead Channel Pacing Threshold Amplitude: 0.625 V
Lead Channel Pacing Threshold Pulse Width: 0.4 ms
Lead Channel Pacing Threshold Pulse Width: 0.4 ms
Lead Channel Sensing Intrinsic Amplitude: 1.75 mV
Lead Channel Sensing Intrinsic Amplitude: 1.75 mV
Lead Channel Sensing Intrinsic Amplitude: 12.5 mV
Lead Channel Sensing Intrinsic Amplitude: 12.5 mV
Lead Channel Setting Pacing Amplitude: 2 V
Lead Channel Setting Pacing Amplitude: 2.5 V
Lead Channel Setting Pacing Pulse Width: 0.4 ms
Lead Channel Setting Sensing Sensitivity: 2 mV

## 2020-02-10 NOTE — Progress Notes (Signed)
Remote pacemaker transmission.   

## 2020-02-14 ENCOUNTER — Ambulatory Visit (HOSPITAL_COMMUNITY)
Admission: RE | Admit: 2020-02-14 | Discharge: 2020-02-14 | Disposition: A | Discharge status: Home / Self Care | Payer: Medicare HMO | Source: Ambulatory Visit | Attending: Cardiology | Admitting: Cardiology

## 2020-02-14 ENCOUNTER — Encounter (HOSPITAL_COMMUNITY): Payer: Self-pay | Admitting: Cardiology

## 2020-02-14 ENCOUNTER — Other Ambulatory Visit: Payer: Self-pay

## 2020-02-14 VITALS — BP 144/88 | HR 78 | Wt 168.4 lb

## 2020-02-14 DIAGNOSIS — Z86718 Personal history of other venous thrombosis and embolism: Secondary | ICD-10-CM | POA: Insufficient documentation

## 2020-02-14 DIAGNOSIS — Z79899 Other long term (current) drug therapy: Secondary | ICD-10-CM | POA: Insufficient documentation

## 2020-02-14 DIAGNOSIS — I442 Atrioventricular block, complete: Secondary | ICD-10-CM | POA: Diagnosis not present

## 2020-02-14 DIAGNOSIS — Z86711 Personal history of pulmonary embolism: Secondary | ICD-10-CM | POA: Diagnosis not present

## 2020-02-14 DIAGNOSIS — E890 Postprocedural hypothyroidism: Secondary | ICD-10-CM | POA: Diagnosis not present

## 2020-02-14 DIAGNOSIS — F32A Depression, unspecified: Secondary | ICD-10-CM | POA: Diagnosis not present

## 2020-02-14 DIAGNOSIS — Z950 Presence of cardiac pacemaker: Secondary | ICD-10-CM | POA: Insufficient documentation

## 2020-02-14 DIAGNOSIS — I428 Other cardiomyopathies: Secondary | ICD-10-CM | POA: Diagnosis not present

## 2020-02-14 DIAGNOSIS — I110 Hypertensive heart disease with heart failure: Secondary | ICD-10-CM | POA: Diagnosis not present

## 2020-02-14 DIAGNOSIS — E7800 Pure hypercholesterolemia, unspecified: Secondary | ICD-10-CM | POA: Insufficient documentation

## 2020-02-14 DIAGNOSIS — Z8249 Family history of ischemic heart disease and other diseases of the circulatory system: Secondary | ICD-10-CM | POA: Insufficient documentation

## 2020-02-14 DIAGNOSIS — Z7982 Long term (current) use of aspirin: Secondary | ICD-10-CM | POA: Diagnosis not present

## 2020-02-14 DIAGNOSIS — Z8619 Personal history of other infectious and parasitic diseases: Secondary | ICD-10-CM | POA: Diagnosis not present

## 2020-02-14 DIAGNOSIS — I340 Nonrheumatic mitral (valve) insufficiency: Secondary | ICD-10-CM | POA: Diagnosis not present

## 2020-02-14 DIAGNOSIS — I5022 Chronic systolic (congestive) heart failure: Secondary | ICD-10-CM | POA: Diagnosis not present

## 2020-02-14 LAB — CBC
HCT: 45.7 % (ref 36.0–46.0)
Hemoglobin: 14.5 g/dL (ref 12.0–15.0)
MCH: 30.8 pg (ref 26.0–34.0)
MCHC: 31.7 g/dL (ref 30.0–36.0)
MCV: 97 fL (ref 80.0–100.0)
Platelets: 396 10*3/uL (ref 150–400)
RBC: 4.71 MIL/uL (ref 3.87–5.11)
RDW: 14.1 % (ref 11.5–15.5)
WBC: 11.1 10*3/uL — ABNORMAL HIGH (ref 4.0–10.5)
nRBC: 0 % (ref 0.0–0.2)

## 2020-02-14 LAB — BASIC METABOLIC PANEL
Anion gap: 10 (ref 5–15)
BUN: 11 mg/dL (ref 8–23)
CO2: 26 mmol/L (ref 22–32)
Calcium: 9.6 mg/dL (ref 8.9–10.3)
Chloride: 100 mmol/L (ref 98–111)
Creatinine, Ser: 1.04 mg/dL — ABNORMAL HIGH (ref 0.44–1.00)
GFR, Estimated: 60 mL/min — ABNORMAL LOW (ref >60)
Glucose, Bld: 92 mg/dL (ref 70–99)
Potassium: 4.1 mmol/L (ref 3.5–5.1)
Sodium: 136 mmol/L (ref 135–145)

## 2020-02-14 LAB — LIPID PANEL
Cholesterol: 274 mg/dL — ABNORMAL HIGH (ref 0–200)
HDL: 75 mg/dL (ref >40)
Total CHOL/HDL Ratio: 3.7 RATIO
Triglycerides: 86 mg/dL (ref <150)
VLDL: 17 mg/dL (ref 0–40)

## 2020-02-14 NOTE — Patient Instructions (Signed)
Labs done today, your results will be available in MyChart, we will contact you for abnormal readings.  Your physician has requested that you have an echocardiogram. Echocardiography is a painless test that uses sound waves to create images of your heart. It provides your doctor with information about the size and shape of your heart and how well your heart's chambers and valves are working. This procedure takes approximately one hour. There are no restrictions for this procedure.  Please call our office in April 2022 to schedule your follow up appointment  If you have any questions or concerns before your next appointment please send [place] a message through [place] or call our office at [phone].    TO LEAVE A MESSAGE FOR THE NURSE SELECT OPTION 2, PLEASE LEAVE A MESSAGE INCLUDING: . YOUR NAME . DATE OF BIRTH . CALL BACK NUMBER . REASON FOR CALL**this is important as we prioritize the call backs  YOU WILL RECEIVE A CALL BACK THE SAME DAY AS LONG AS YOU CALL BEFORE 4:00 PM  At the [facility], you and your health needs are our priority. As part of our continuing mission to provide you with exceptional heart care, we have created designated Provider Care Teams. These Care Teams include your primary Cardiologist (physician) and Advanced Practice Providers (APPs- Physician Assistants and Nurse Practitioners) who all work together to provide you with the care you need, when you need it.   You may see any of the following providers on your designated Care Team at your next follow up: Marland Kitchen Dr Arvilla Meres . Dr Marca Ancona . Tonye Becket, NP . Robbie Lis, PA . Karle Plumber, PharmD   Please be sure to bring in all your medications bottles to every appointment.

## 2020-02-15 NOTE — Progress Notes (Signed)
Patient ID: Stacy Mcintosh, female   DOB: [birth year], 64 y.o.   MRN: [id number] PCP: Deneen Harts Cardiology: Dr. Shirlee Latch  64 y.o. with history of hypothyroidism, nonischemic cardiomyopathy, and PE.  Hypothyroidism has been controlled by [place] thyroid.  She is now seeing Dr. Lucianne Muss for endocrine management.  Echo in 5/17 showed EF 35-40% with diffuse hypokinesis, repeat echo in 01/2016 with improved EF of 60-65%.   In 10/17, she was found to have complete heart block and a Medtronic dual chamber PPM was placed. Last echo in 10/18 showed EF 45% with apical hypokinesis.  She had a Cardiolite in 11/18 with EF 58%, small reversible apical defect, overall low risk study. Echo in 2/19 showed EF 45-50% with apical hypokinesis.  She saw Dr. Elberta Fortis who did not recommend CRT upgrade as EF was only very mildly decreased.   LHC/RHC (5/19) showed no CAD, optimized filling pressures.   Echo in 11/19 showed EF 45-50%, mild LVH, normal RV.  Echo in 9/20 with EF up to 55%.   She returns today for followup of CHF.  She is taking Lasix prn.  She still fatigues easily and has chronic low back pain. . Mild dyspnea walking long distances.  No orthopnea/PND.  No chest pain. She is sleepy during the day.   ECG (personally reviewed): NSR, BiV pacing  Labs (10/10): BNP 13, K 3.7, creatinine 0.8 Labs (7/11): K 4.1, creatinine 0.6 Labs (7/12): LDL 143, HDL 74, K 3.3, creatinine 1.0, TSH normal Labs (12/12): K 4, creatinine 0.8, HDL 85, LDL 107 Labs (11/15): K 4.2, creatinine 0.74, LDL 78, HDL 76 Labs (4/17): TSH normal, K 4.5, creatinine 0.92, HCT 42.2 Labs (5/17): LDL 105, HDL 72, BNP 261 Labs (6/17): D dimer negative, BNP 64 Labs (8/17): K 4.2, creatinine 1.05, TSH 0.329 (low), K 4.2, creatinine 1.05, hgb 15.2, BNP 25 Labs (10/17): K 3.5, creatinine 1.06 Labs (9/18): K 3.6, creatinine 1.07, TSH normal Labs (2/19): K 3.6, creatinine 0.99 Labs (6/20): K 3.8, creatinine 1.18, TSH normal  Allergies  (verified):  1)  ! Lisinopril  Past Medical History: 1. Hypothyroidism and history of Graves disease.  She is status post radioactive iodine therapy.  She then developed hypothyroidism, and the hypothyroidism has been difficult to control.   2. Cardiomyopathy, nonischemic.  The patient had a cardiac MRI done during her hospitalization in July 2009.  This showed moderate LV enlargement and global LV hypokinesis.  There was a septal paradox pattern.  EF was calculated to be 27%.  There was mild left atrial enlargement.  The RV was normal in size and function.  There was no ASD or VSD.  There was no evidence for delayed enhancement pattern  in the LV myocardium suggesting that there was no scar from coronary artery disease and no definite evidence for infiltrative cardiomyopathy.  HIV was negative.  She did have an echo done also in the hospital in July 2009 that showed severe mitral regurgitation.  A TEE done following this showed an EF of 25-30% with severe mitral regurgitation.  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  The patient did have a left and right heart catheterization after treatment for congestive heart failure in January 2010.  There was no angiographic coronary disease.  EF had improved to 55-60% with no significant mitral  regurgitation.  The hemodynamics were totally normal on the right heart catheterization.  The cardiomyopathy, I think, was likely due to poorly controlled hypothyroidism.   -  Echo (8/10): EF 50-55%, mild LV dilatation, mild-mod MR, PASP 32 - Echo (11/15): EF 30% - Echo (5/17): EF 35-40%, mild-moderate TR, moderate MR, PASP 50 mmHg.  - Echo (10/17): EF 60-65%. Mild TR  - Echo (10/18): EF 45%, apical hypokinesis.  - Cardiolite (11/18): EF 58%, reversible apical defect (small), cannot rule out ischemia, low risk overall.  - Echo (2/19): EF 45-50%, apical hypokinesis - [facility] (5/19): No significant coronary disease. Mean RA 5, PA 27/9, PCWP 9, CI  2.91.  - Echo (11/19): EF 45-50%, mild LVH, normal RV.  - Echo (9/20): EF 55%, mild RV dilation with normal systolic function.  3. PE/DVT.  July 2009.  The patient had been very sedentary prior to this due to her severe hypothyroidism.  She had  normal antithrombin III.  She was negative for factor V Leiden.  She had normal protein C and protein S function.  Now off coumadin.  4. Hypertension. 5. History of left bundle-branch block. 6. Asthma. 7. Migraines. 8. ACEI cough 9. Low back pain: L-spine disease.  10. Lyme disease: Treated in 12/16.  11. Complete heart block with Medtronic dual chamber PPM.  12. Depression  Family History: Mother-living; heart attack, HTN, Rheumatism Father-deceased age 64; prostate cancer Brother-living, parathyroid problem Sister- living; arthiritis Brother- living; premature heart attack x 2; DM-insulin Niece- blood clot in groin  Social History: Married with 3 children  Teacher, adult education. Never smoked ETOH-no   Review of systems complete and found to be negative unless listed in HPI.   Current Outpatient Medications  Medication Sig Dispense Refill  . aspirin EC 81 MG tablet Take 81 mg by mouth daily.     . carvedilol (COREG) 6.25 MG tablet Take 1 tablet (6.25 mg total) by mouth 2 (two) times daily with a meal. Must keep pending appt for further refills 60 tablet 0  . doxepin (SINEQUAN) 10 MG capsule Take 20 mg by mouth at bedtime.     . furosemide (LASIX) 40 MG tablet TAKE 1 TABLET (40 MG TOTAL) BY MOUTH DAILY. MAY TAKE AN ADDITIONAL 40MG  DAILY FOR FLUID. 115 tablet 0  . gabapentin (NEURONTIN) 300 MG capsule Take 300 mg by mouth 3 (three) times daily.    lamoTRIgine (LAMICTAL) 25 MG tablet     . LORazepam (ATIVAN) 1 MG tablet     . pantoprazole (PROTONIX) 40 MG tablet Take 1 tablet (40 mg total) by mouth daily. 90 tablet 3  . sacubitril-valsartan (ENTRESTO) 49-51 MG Take 1 tablet by mouth 2 (two) times daily.    . simvastatin (ZOCOR) 40 MG  tablet TAKE 1 TABLET (40 MG TOTAL) BY MOUTH DAILY AT 6 PM. 30 tablet 0  . spironolactone (ALDACTONE) 25 MG tablet Take 1 tablet (25 mg total) by mouth daily. Must keep pending appt for further refills 30 tablet 0  . thyroid (ARMOUR THYROID) 60 MG tablet TAKE 1 TABLET (60 MG TOTAL) BY MOUTH DAILY BEFORE BREAKFAST. 90 tablet 3  . tiZANidine (ZANAFLEX) 4 MG tablet Take 4 mg by mouth 3 (three) times daily as needed. 1/2tqb prn  6  . UNABLE TO FIND Med Name: otc lung tonic    . VENTOLIN HFA 108 (90 Base) MCG/ACT inhaler INHALE 1 TO 2 PUFFS EVERY 4 6 HOURS AS NEEDED FOR WHEEZING.    . Vitamin D, Ergocalciferol, (DRISDOL) 1.25 MG (50000 UNIT) CAPS capsule Take 50,000 Units by mouth every 7 (seven) days.     No current facility-administered medications for this encounter.  BP (!) 144/88   Pulse 78   Wt 76.4 kg (168 lb 6.4 oz)   SpO2 98%   BMI 34.01 kg/m  General: NAD Neck: No JVD, no thyromegaly or thyroid nodule.  Lungs: Clear to auscultation bilaterally with normal respiratory effort. CV: Nondisplaced PMI.  Heart regular S1/S2, no S3/S4, no murmur.  No peripheral edema.  No carotid bruit.  Normal pedal pulses.  Abdomen: Soft, nontender, no hepatosplenomegaly, no distention.  Skin: Intact without lesions or rashes.  Neurologic: Alert and oriented x 3.  Psych: Normal affect. Extremities: No clubbing or cyanosis.  HEENT: Normal.   Assessment/Plan:  1. Chronic systolic CHF:  Patient has a nonischemic cardiomyopathy that was thought initially to be due to uncontrolled hypothyroidism. Cardiac MRI in 2009 showed no LGE.  EF improved to 50-55% on echo in 2010. Cath in 2010 showed normal coronaries.  However, EF was back down to 30% in 11/15 and was 35-40% in 5/17.  Echo in 01/2016 with improvement to EF 60-65%.  However, 10/18 echo showed fall in EF to 45% with apical wall motion abnormality.  Cardiolite in 11/18 showed a small area of possible apical ischemia and EF 58%. 5/19 LHC/RHC showed no  CAD, normal filling pressures and cardiac output.  Most recent echo in 9/20 showed EF up to 55%. She is now RV pacing 99% of the time because of PPM implanted for CHB, Dr Elberta Fortis opted against CRT upgrade as EF is only mildly depressed.  Ongoing NYHA class III symptoms with prominent fatigue. She is not volume overloaded on exam.  - Continue Spiro 25 mg daily. BMET today.  - Continue Entresto 49/51 bid.   - Continue Coreg 6.25 mg bid.  - She can continue to use Lasix prn.   2. Hypothyroidism: Per Dr. Lucianne Muss.  3. HYPERCHOLESTEROLEMIA:  - Continue simvastatin. Check lipids today.  4. Pulmonary embolism: In 2009. No longer taking Coumadin.  - Continue daily ASA.  5. Complete heart block: Medtronic PPM, See above.  6. Depression: Think this plays a large role in her symptoms  Followup 6 months with echo.    Marca Ancona, MD  02/15/2020

## 2020-03-16 ENCOUNTER — Other Ambulatory Visit (HOSPITAL_COMMUNITY): Payer: Self-pay | Admitting: Cardiology

## 2020-03-16 MED FILL — SIMVASTATIN 40 MG TABLET: 40 | 30 days supply | Qty: 30 | Fill #0

## 2020-03-16 MED FILL — SPIRONOLACTONE 25 MG TABS: 25 | 30 days supply | Qty: 30 | Fill #0

## 2020-03-16 MED FILL — CARVEDILOL 6.25 MG TABLET: 6.25 | 30 days supply | Qty: 60 | Fill #0

## 2020-04-16 ENCOUNTER — Telehealth (HOSPITAL_COMMUNITY): Payer: Self-pay | Admitting: Pharmacy Technician

## 2020-04-16 NOTE — Telephone Encounter (Signed)
It's time to re-enroll patient to receive medication assistance for Entresto from [employer]. Left voicemail for patient to call me back so that we can start the re-enrollment process.  Germaine Ripp [place], CPhT     

## 2020-04-21 MED FILL — SPIRONOLACTONE 25 MG TABS: 25 | 30 days supply | Qty: 30 | Fill #1

## 2020-04-21 MED FILL — CARVEDILOL 6.25 MG TABLET: 6.25 | 30 days supply | Qty: 60 | Fill #1

## 2020-05-05 ENCOUNTER — Ambulatory Visit (INDEPENDENT_AMBULATORY_CARE_PROVIDER_SITE_OTHER): Payer: Medicare HMO

## 2020-05-05 DIAGNOSIS — I442 Atrioventricular block, complete: Secondary | ICD-10-CM

## 2020-05-06 LAB — CUP PACEART REMOTE DEVICE CHECK
Battery Remaining Longevity: 59 mo
Battery Voltage: 3 V
Brady Statistic AP VP Percent: 14.76 %
Brady Statistic AP VS Percent: 0.01 %
Brady Statistic AS VP Percent: 84.76 %
Brady Statistic AS VS Percent: 0.46 %
Brady Statistic RA Percent Paced: 14.59 %
Brady Statistic RV Percent Paced: 99.05 %
Date Time Interrogation Session: 20220111213953
Implantable Lead Implant Date: 20171024
Implantable Lead Implant Date: 20171024
Implantable Lead Location: 753859
Implantable Lead Location: 753860
Implantable Lead Model: 5076
Implantable Lead Model: 5076
Implantable Pulse Generator Implant Date: 20171024
Lead Channel Impedance Value: 380 Ohm
Lead Channel Impedance Value: 418 Ohm
Lead Channel Impedance Value: 418 Ohm
Lead Channel Impedance Value: 475 Ohm
Lead Channel Pacing Threshold Amplitude: 0.375 V
Lead Channel Pacing Threshold Amplitude: 0.625 V
Lead Channel Pacing Threshold Pulse Width: 0.4 ms
Lead Channel Pacing Threshold Pulse Width: 0.4 ms
Lead Channel Sensing Intrinsic Amplitude: 1.125 mV
Lead Channel Sensing Intrinsic Amplitude: 1.125 mV
Lead Channel Sensing Intrinsic Amplitude: 11.75 mV
Lead Channel Sensing Intrinsic Amplitude: 11.75 mV
Lead Channel Setting Pacing Amplitude: 2 V
Lead Channel Setting Pacing Amplitude: 2.5 V
Lead Channel Setting Pacing Pulse Width: 0.4 ms
Lead Channel Setting Sensing Sensitivity: 2 mV

## 2020-05-19 MED FILL — SPIRONOLACTONE 25 MG TABS: 25 | 30 days supply | Qty: 30 | Fill #2

## 2020-05-20 MED FILL — CARVEDILOL 6.25 MG TABLET: 6.25 | 30 days supply | Qty: 60 | Fill #2

## 2020-05-21 ENCOUNTER — Other Ambulatory Visit (HOSPITAL_COMMUNITY): Payer: Self-pay | Admitting: Cardiology

## 2020-05-21 ENCOUNTER — Other Ambulatory Visit (HOSPITAL_COMMUNITY): Payer: Self-pay

## 2020-05-21 MED ORDER — SPIRONOLACTONE 25 MG PO TABS
25.0000 mg | ORAL_TABLET | Freq: Every day | ORAL | 3 refills | Status: DC
Start: 2020-05-21 — End: 2020-05-21

## 2020-05-21 NOTE — Progress Notes (Signed)
Remote pacemaker transmission.   

## 2020-06-02 ENCOUNTER — Telehealth (HOSPITAL_COMMUNITY): Payer: Self-pay | Admitting: Pharmacy Technician

## 2020-06-02 NOTE — Telephone Encounter (Signed)
Patient called in requesting Entesto assistance. Called back and left a message for her to return my call.  Archer Asa, CPhT

## 2020-06-11 ENCOUNTER — Other Ambulatory Visit (HOSPITAL_COMMUNITY): Payer: Self-pay | Admitting: *Deleted

## 2020-06-11 ENCOUNTER — Telehealth (HOSPITAL_COMMUNITY): Payer: Self-pay | Admitting: Pharmacy Technician

## 2020-06-11 MED ORDER — ENTRESTO 49-51 MG PO TABS
1.0000 | ORAL_TABLET | Freq: Two times a day (BID) | ORAL | 3 refills | Status: DC
Start: 2020-06-11 — End: 2021-03-16

## 2020-06-11 NOTE — Telephone Encounter (Signed)
Spoke with patient regarding Entresto assistance. Will have the application at the check in desk along with a month of samples.  Will fax in once signatures are obtained.

## 2020-06-15 MED FILL — SIMVASTATIN 40 MG TABLET: 40 | 30 days supply | Qty: 30 | Fill #1

## 2020-06-15 MED FILL — CARVEDILOL 6.25 MG TABLET: 6.25 | 30 days supply | Qty: 60 | Fill #3

## 2020-06-15 MED FILL — SPIRONOLACTONE 25 MG TABS: 25 | 30 days supply | Qty: 30 | Fill #3

## 2020-06-17 NOTE — Telephone Encounter (Signed)
Sent in application via fax.  Will follow up.  

## 2020-06-24 ENCOUNTER — Telehealth (HOSPITAL_COMMUNITY): Payer: Self-pay | Admitting: Pharmacy Technician

## 2020-06-24 NOTE — Telephone Encounter (Signed)
I received a message from [place] with [employer] stating that [facility] informed them that the patient needed a PA for [place]. Upon further review, I was able to get a paid claim of $47 for 30 days.   [employer] and explained that a PA was not required. The representative stated that I would need to contact [employer] as they have already done their benefits investigation. [employer] will not accept a screen shot from [facility] stating that it does not need a PA. They will also not accept a conference call with [employer] to confirm that there truly is no PA needed.  Spoke with Production designer, theatre/television/film, Sue Lush and she is going to forward the information to the benefits team at [employer] to reach back out to [employer]. I requested a follow up call after they contact [employer]. Sue Lush said that she would follow up with me.

## 2020-06-26 NOTE — Telephone Encounter (Signed)
Called [employer] to check the status of the patient's application. Representative stated that there is no new updates. I asked what kind of timeline to expect, the representative stated 7-10 business days.

## 2020-07-06 NOTE — Telephone Encounter (Addendum)
Advanced Heart Failure Patient Advocate Encounter   Patient was approved to receive Entresto from [employer]  Patient ID: [id number] Effective dates: 06/26/20 through 04/24/21  Called the patient, no voicemail was set up.  Archer Asa, CPhT

## 2020-07-15 ENCOUNTER — Telehealth: Payer: Self-pay | Admitting: Cardiology

## 2020-07-15 NOTE — Telephone Encounter (Signed)
Attempted to return pt call, no answer/ no VM on home phone.  Called pt mobile number.  She answered saying she was busy at this time.  Offered pt in-0clinic check tomorrow morning at 9:20am, [address] office.  Pt agreed.

## 2020-07-15 NOTE — Telephone Encounter (Signed)
Patient states ever since her PPM was inserted, she has had irritation/issues at the site. She states there is now what appears to be a boil or cyst. She requested a call back with advisement and she scheduled an appointment for 08/25/20 with Dr. Elberta Fortis.

## 2020-07-16 ENCOUNTER — Ambulatory Visit (INDEPENDENT_AMBULATORY_CARE_PROVIDER_SITE_OTHER): Payer: Medicare HMO | Admitting: Emergency Medicine

## 2020-07-16 ENCOUNTER — Other Ambulatory Visit (HOSPITAL_COMMUNITY): Payer: Self-pay | Admitting: Cardiology

## 2020-07-16 ENCOUNTER — Other Ambulatory Visit: Payer: Self-pay

## 2020-07-16 DIAGNOSIS — Z950 Presence of cardiac pacemaker: Secondary | ICD-10-CM

## 2020-07-16 MED FILL — SPIRONOLACTONE 25 MG TABLET: 25 | 30 days supply | Qty: 30 | Fill #0

## 2020-07-16 NOTE — Progress Notes (Signed)
Pt in office for wound check.  Device implanted 2017, she reports she has always has had some tenderness at PM pocket.  In may of last year she noted development of a cyst like bump on the incision.  She comes in today as she developed pain in the left axillary region.  The pain under her arm has resolved but she was concerned they were associated.    Bump on incision observed and photo sent to Dr. Elberta Fortis.  RU, PA also in room to obseerve.  The left axillary pain does not seem to be associated and since it has resolved pt educated to continue to monitor and f/u with PCP if reoccurs.    As for the cyst, RU advise f/u visit on day when Dr. Elberta Fortis is present for observation.  Pt scheduled for 07/30/20.    Patient provided verbal consent for photograph to be entered into chart.

## 2020-07-17 ENCOUNTER — Other Ambulatory Visit (HOSPITAL_COMMUNITY): Payer: Self-pay | Admitting: Cardiology

## 2020-07-17 MED FILL — CARVEDILOL 6.25 MG TABLET: 6.25 | 30 days supply | Qty: 60 | Fill #0

## 2020-07-25 ENCOUNTER — Other Ambulatory Visit (HOSPITAL_COMMUNITY): Payer: Self-pay

## 2020-07-30 ENCOUNTER — Ambulatory Visit: Payer: Medicare HMO

## 2020-08-04 ENCOUNTER — Ambulatory Visit: Payer: Medicare HMO

## 2020-08-04 ENCOUNTER — Ambulatory Visit (INDEPENDENT_AMBULATORY_CARE_PROVIDER_SITE_OTHER): Payer: Medicare HMO

## 2020-08-04 DIAGNOSIS — I442 Atrioventricular block, complete: Secondary | ICD-10-CM | POA: Diagnosis not present

## 2020-08-04 LAB — CUP PACEART REMOTE DEVICE CHECK
Battery Remaining Longevity: 58 mo
Battery Voltage: 2.99 V
Brady Statistic AP VP Percent: 4.04 %
Brady Statistic AP VS Percent: 0 %
Brady Statistic AS VP Percent: 94.71 %
Brady Statistic AS VS Percent: 1.25 %
Brady Statistic RA Percent Paced: 4.02 %
Brady Statistic RV Percent Paced: 98.64 %
Date Time Interrogation Session: 20220412144735
Implantable Lead Implant Date: 20171024
Implantable Lead Implant Date: 20171024
Implantable Lead Location: 753859
Implantable Lead Location: 753860
Implantable Lead Model: 5076
Implantable Lead Model: 5076
Implantable Pulse Generator Implant Date: 20171024
Lead Channel Impedance Value: 380 Ohm
Lead Channel Impedance Value: 418 Ohm
Lead Channel Impedance Value: 418 Ohm
Lead Channel Impedance Value: 475 Ohm
Lead Channel Pacing Threshold Amplitude: 0.5 V
Lead Channel Pacing Threshold Amplitude: 0.875 V
Lead Channel Pacing Threshold Pulse Width: 0.4 ms
Lead Channel Pacing Threshold Pulse Width: 0.4 ms
Lead Channel Sensing Intrinsic Amplitude: 0.75 mV
Lead Channel Sensing Intrinsic Amplitude: 0.75 mV
Lead Channel Sensing Intrinsic Amplitude: 24.625 mV
Lead Channel Sensing Intrinsic Amplitude: 24.625 mV
Lead Channel Setting Pacing Amplitude: 2 V
Lead Channel Setting Pacing Amplitude: 2.5 V
Lead Channel Setting Pacing Pulse Width: 0.4 ms
Lead Channel Setting Sensing Sensitivity: 2 mV

## 2020-08-13 ENCOUNTER — Other Ambulatory Visit (HOSPITAL_COMMUNITY): Payer: Self-pay

## 2020-08-13 MED FILL — Spironolactone Tab 25 MG: ORAL | 30 days supply | Qty: 30 | Fill #0 | Status: AC

## 2020-08-14 ENCOUNTER — Other Ambulatory Visit (HOSPITAL_COMMUNITY): Payer: Self-pay

## 2020-08-19 NOTE — Progress Notes (Signed)
Remote pacemaker transmission.   

## 2020-08-25 ENCOUNTER — Encounter: Payer: Medicare HMO | Admitting: Cardiology

## 2020-09-07 ENCOUNTER — Other Ambulatory Visit (HOSPITAL_COMMUNITY): Payer: Self-pay

## 2020-09-07 MED FILL — Carvedilol Tab 6.25 MG: ORAL | 30 days supply | Qty: 60 | Fill #0 | Status: AC

## 2020-09-07 MED FILL — Spironolactone Tab 25 MG: ORAL | 30 days supply | Qty: 30 | Fill #1 | Status: AC

## 2020-09-15 ENCOUNTER — Encounter: Payer: Self-pay | Admitting: Cardiology

## 2020-09-15 ENCOUNTER — Ambulatory Visit (INDEPENDENT_AMBULATORY_CARE_PROVIDER_SITE_OTHER): Payer: Medicare HMO | Admitting: Cardiology

## 2020-09-15 ENCOUNTER — Other Ambulatory Visit: Payer: Self-pay

## 2020-09-15 VITALS — BP 139/87 | HR 82 | Ht 59.0 in | Wt 174.0 lb

## 2020-09-15 DIAGNOSIS — I442 Atrioventricular block, complete: Secondary | ICD-10-CM

## 2020-09-15 NOTE — Patient Instructions (Signed)
Medication Instructions:  Your physician recommends that you continue on your current medications as directed. Please refer to the Current Medication list given to you today.  *If you need a refill on your cardiac medications before your next appointment, please call your pharmacy*   Lab Work: None ordered   Testing/Procedures: None ordered   Follow-Up: At [facility], you and your health needs are our priority.  As part of our continuing mission to provide you with exceptional heart care, we have created designated Provider Care Teams.  These Care Teams include your primary Cardiologist (physician) and Advanced Practice Providers (APPs -  Physician Assistants and Nurse Practitioners) who all work together to provide you with the care you need, when you need it.    Remote monitoring is used to monitor your Pacemaker or ICD from home. This monitoring reduces the number of office visits required to check your device to one time per year. It allows us to keep an eye on the functioning of your device to ensure it is working properly. You are scheduled for a device check from home on 11/03/2020. You may send your transmission at any time that day. If you have a wireless device, the transmission will be sent automatically. After your physician reviews your transmission, you will receive a postcard with your next transmission date.  Your next appointment:   1 year(s)  The format for your next appointment:   In Person  Provider:   Will Camnitz, MD   Thank you for choosing [facility]!!   Kallen Mccrystal, RN [phone]   

## 2020-09-15 NOTE — Progress Notes (Signed)
Electrophysiology Office Note   Date:  09/15/2020   ID:  Stacy Mcintosh, DOB 05-Dec-1955, MRN [id number]  PCP:  Dois Davenport, MD  Cardiologist:  Shirlee Latch Primary Electrophysiologist:  Areli Jowett Jorja Loa, MD    No chief complaint on file.    History of Present Illness: Stacy Mcintosh is a 65 y.o. female who presents today for electrophysiology evaluation.     She has a history of hypothyroidism, nonischemic cardiomyopathy, and PE.  She presented to heart failure clinic on routine ICD was found to be in heart block with a right bundle branch block morphology.  He has a history of left bundle branch block in the past.  She is now status post Medtronic dual-chamber pacemaker implanted 02/16/2016.  Ejection fraction.  She was put on optimal heart failure medication and her ejection fraction is at 55%.  Today, denies symptoms of palpitations, chest pain, shortness of breath, orthopnea, PND, lower extremity edema, claudication, dizziness, presyncope, syncope, bleeding, or neurologic sequela. The patient is tolerating medications without difficulties.  Overall she feels well.  She has no chest pain or shortness of breath.  She is able to do all of her daily activities.  Past Medical History:  Diagnosis Date  . Anxiety   . Arthritis    "lower back" (02/16/2016)  . BUNDLE BRANCH BLOCK, LEFT 11/12/2008  . CARDIOMYOPATHY 11/12/2008  . CHF (congestive heart failure) (HCC)    a.Echo 5/17: EF 35-40%, moderate MR, mild LAE, mild to moderate TR, PASP 50 mmHg  . Chronic lower back pain   . Depression   . DVT (deep venous thrombosis) (HCC) 2008   LLE  . Graves' disease    S/P radiation; "now up/down"  . HYPERTENSION 10/16/2007  . Hypothyroidism    h/o Graves Disease  . Migraine    "controlled w/piercings" (02/16/2016)  . Other and unspecified hyperlipidemia 01/19/2010  . PE (pulmonary embolism) 2008   Factor V negative  . Presence of permanent cardiac pacemaker   . Pure hypercholesterolemia  11/18/2008   Past Surgical History:  Procedure Laterality Date  . ABDOMINAL HYSTERECTOMY  1990s  . CARDIAC CATHETERIZATION  2009?  . CESAREAN SECTION  1974; 198j0; 1987   x3  . EP IMPLANTABLE DEVICE N/A 02/16/2016   Procedure: Pacemaker Implant;  Surgeon: Keylan Costabile Jorja Loa, MD;  Location: [facility] INVASIVE CV LAB;  Service: Cardiovascular;  Laterality: N/A;  . HERNIA REPAIR  1956  . INSERT / REPLACE / REMOVE PACEMAKER    . RIGHT/LEFT HEART CATH AND CORONARY ANGIOGRAPHY N/A 09/08/2017   Procedure: RIGHT/LEFT HEART CATH AND CORONARY ANGIOGRAPHY;  Surgeon: Laurey Morale, MD;  Location: [facility] INVASIVE CV LAB;  Service: Cardiovascular;  Laterality: N/A;     Current Outpatient Medications  Medication Sig Dispense Refill  . aspirin EC 81 MG tablet Take 81 mg by mouth daily.     . carvedilol (COREG) 6.25 MG tablet TAKE 1 TABLET (6.25 MG TOTAL) BY MOUTH 2 (TWO) TIMES DAILY WITH A MEAL. 60 tablet 2  . doxepin (SINEQUAN) 10 MG capsule Take 20 mg by mouth at bedtime.    . furosemide (LASIX) 40 MG tablet TAKE 1 TABLET (40 MG TOTAL) BY MOUTH DAILY. MAY TAKE AN ADDITIONAL 40MG  DAILY FOR FLUID. 115 tablet 0  . gabapentin (NEURONTIN) 300 MG capsule Take 300 mg by mouth 3 (three) times daily.    lamoTRIgine (LAMICTAL) 25 MG tablet     . pantoprazole (PROTONIX) 40 MG tablet Take 1 tablet (40 mg total) by mouth  daily. 90 tablet 3  . sacubitril-valsartan (ENTRESTO) 49-51 MG Take 1 tablet by mouth 2 (two) times daily. 180 tablet 3  . simvastatin (ZOCOR) 40 MG tablet TAKE 1 TABLET (40 MG TOTAL) BY MOUTH DAILY AT 6 PM. (Patient taking differently: Take by mouth daily. at 6pm) 30 tablet 3  . spironolactone (ALDACTONE) 25 MG tablet Take 1 tablet (25 mg total) by mouth daily. 30 tablet 3  . thyroid (ARMOUR THYROID) 60 MG tablet TAKE 1 TABLET (60 MG TOTAL) BY MOUTH DAILY BEFORE BREAKFAST. 90 tablet 3  . tiZANidine (ZANAFLEX) 4 MG tablet Take 4 mg by mouth 3 (three) times daily as needed. 1/2tqb prn  6  . UNABLE TO  FIND Med Name: otc lung tonic    . Vitamin D, Ergocalciferol, (DRISDOL) 1.25 MG (50000 UNIT) CAPS capsule Take 50,000 Units by mouth every 7 (seven) days.     No current facility-administered medications for this visit.    Allergies:   Augmentin [amoxicillin-pot clavulanate], Lisinopril, Prednisone, Symbicort [budesonide-formoterol fumarate], and Azithromycin   Social History:  The patient  reports that she has never smoked. She has never used smokeless tobacco. She reports current alcohol use. She reports that she does not use drugs.   Family History:  The patient's family history includes Arthritis in her sister; Diabetes in her brother; Heart attack in her brother and mother; Hypertension in her mother and sister; Ovarian cancer in her mother; Prostate cancer in her father; Rheumatologic disease in her mother; Sudden death in her maternal grandfather; Thyroid disease in her brother.   ROS:  Please see the history of present illness.   Otherwise, review of systems is positive for none.   All other systems are reviewed and negative.   PHYSICAL EXAM: VS:  BP 139/87   Pulse 82   Ht 4\' 11"  (1.499 m)   Wt 174 lb (78.9 kg)   BMI 35.14 kg/m  , BMI Body mass index is 35.14 kg/m. GEN: Well nourished, well developed, in no acute distress  HEENT: normal  Neck: no JVD, carotid bruits, or masses Cardiac: RRR; no murmurs, rubs, or gallops,no edema  Respiratory:  clear to auscultation bilaterally, normal work of breathing GI: soft, nontender, nondistended, + BS MS: no deformity or atrophy  Skin: warm and dry, device site well healed Neuro:  Strength and sensation are intact Psych: euthymic mood, full affect  EKG:  EKG is ordered today. Personal review of the ekg ordered shows sinus rhythm, ventricular paced, rate 82  Personal review of the device interrogation today. Results in [facility]   Recent Labs: 02/14/2020: BUN 11; Creatinine, Ser 1.04; Hemoglobin 14.5; Platelets 396; Potassium 4.1;  Sodium 136    Lipid Panel     Component Value Date/Time   CHOL 274 (H) 02/14/2020 1112   TRIG 86 02/14/2020 1112   HDL 75 02/14/2020 1112   CHOLHDL 3.7 02/14/2020 1112   VLDL 17 02/14/2020 1112   LDLCALC NOT CALCULATED 02/14/2020 1112   LDLDIRECT 140.8 04/23/2012 1011     Wt Readings from Last 3 Encounters:  09/15/20 174 lb (78.9 kg)  02/14/20 168 lb 6.4 oz (76.4 kg)  08/05/19 172 lb (78 kg)      Other studies Reviewed: Additional studies/ records that were reviewed today include: TTE 01/01/19 Review of the above records today demonstrates:  1. Trivial pericardial effusion is present.  2. The aortic root is normal in size and structure.  3. The aortic valve is tricuspid. No stenosis of the aortic valve.  4. The left ventricle has a visually estimated ejection fraction of 55%.  The cavity size was normal. Left ventricular diastolic Doppler parameters  are consistent with impaired relaxation. No evidence of left ventricular  regional wall motion  abnormalities.  5. No evidence of mitral valve stenosis. Trivial mitral regurgitation.  6. The interatrial septum was not assessed.  7. The right ventricle has normal systolic function. The cavity was  mildly enlarged. There is no increase in right ventricular wall thickness.  8. Normal IVC size. PA systolic pressure 35 mmHg.   Myoview 03/06/17  Nuclear stress EF: 58% with apical akinesis.  There was no ST segment deviation noted during stress.  There is a small defect of mild severity present in the apex location. The defect is non-reversible and consistent with infarct. No ischemia noted.  This is a low risk study.  The left ventricular ejection fraction is normal (55-65%).  ASSESSMENT AND PLAN:  1.  Complete heart block: Status post Medtronic dual-chamber pacemaker implanted October 2017.  Device functioning appropriately.  No changes at this time.    2.  Nonischemic cardiomyopathy: Ejection fraction 55% on most  recent echo.  Continue with current management.  Labs/ tests ordered today include:  Orders Placed This Encounter  Procedures  . EKG 12-Lead    Disposition:   FU with Matthe Sloane 12 months  Signed, Fama Muenchow Jorja Loa, MD  09/15/2020 3:45 PM     [facility] [address] [place] [address] [phone] (office) [phone] (fax)

## 2020-09-16 ENCOUNTER — Other Ambulatory Visit (HOSPITAL_COMMUNITY): Payer: Self-pay

## 2020-10-09 ENCOUNTER — Other Ambulatory Visit (HOSPITAL_COMMUNITY): Payer: Self-pay

## 2020-10-09 MED FILL — Spironolactone Tab 25 MG: ORAL | 30 days supply | Qty: 30 | Fill #2 | Status: AC

## 2020-10-09 MED FILL — Carvedilol Tab 6.25 MG: ORAL | 30 days supply | Qty: 60 | Fill #1 | Status: AC

## 2020-10-09 MED FILL — Simvastatin Tab 40 MG: ORAL | 30 days supply | Qty: 30 | Fill #0 | Status: AC

## 2020-10-13 ENCOUNTER — Other Ambulatory Visit (HOSPITAL_COMMUNITY): Payer: Self-pay

## 2020-10-19 ENCOUNTER — Telehealth (HOSPITAL_COMMUNITY): Payer: Self-pay | Admitting: Pharmacy Technician

## 2020-10-19 NOTE — Telephone Encounter (Signed)
Advanced Heart Failure Patient Advocate Encounter  I received a message from triage that the patient is having a hard time getting through to [employer]. Called and provided the patient the phone number to [employer], advised her to bypass the automated refill line. Advised her to call back if she had further issues.   Archer Asa, CPhT

## 2020-11-03 ENCOUNTER — Ambulatory Visit (INDEPENDENT_AMBULATORY_CARE_PROVIDER_SITE_OTHER): Payer: Medicare HMO

## 2020-11-03 DIAGNOSIS — I442 Atrioventricular block, complete: Secondary | ICD-10-CM

## 2020-11-05 LAB — CUP PACEART REMOTE DEVICE CHECK
Battery Remaining Longevity: 51 mo
Battery Voltage: 2.99 V
Brady Statistic AP VP Percent: 3.54 %
Brady Statistic AP VS Percent: 0 %
Brady Statistic AS VP Percent: 94.94 %
Brady Statistic AS VS Percent: 1.51 %
Brady Statistic RA Percent Paced: 3.52 %
Brady Statistic RV Percent Paced: 98.16 %
Date Time Interrogation Session: 20220713200254
Implantable Lead Implant Date: 20171024
Implantable Lead Implant Date: 20171024
Implantable Lead Location: 753859
Implantable Lead Location: 753860
Implantable Lead Model: 5076
Implantable Lead Model: 5076
Implantable Pulse Generator Implant Date: 20171024
Lead Channel Impedance Value: 380 Ohm
Lead Channel Impedance Value: 418 Ohm
Lead Channel Impedance Value: 418 Ohm
Lead Channel Impedance Value: 475 Ohm
Lead Channel Pacing Threshold Amplitude: 0.5 V
Lead Channel Pacing Threshold Amplitude: 0.75 V
Lead Channel Pacing Threshold Pulse Width: 0.4 ms
Lead Channel Pacing Threshold Pulse Width: 0.4 ms
Lead Channel Sensing Intrinsic Amplitude: 18.625 mV
Lead Channel Sensing Intrinsic Amplitude: 18.625 mV
Lead Channel Sensing Intrinsic Amplitude: 3.375 mV
Lead Channel Sensing Intrinsic Amplitude: 3.375 mV
Lead Channel Setting Pacing Amplitude: 2 V
Lead Channel Setting Pacing Amplitude: 2.5 V
Lead Channel Setting Pacing Pulse Width: 0.4 ms
Lead Channel Setting Sensing Sensitivity: 2 mV

## 2020-11-10 ENCOUNTER — Other Ambulatory Visit (HOSPITAL_COMMUNITY): Payer: Self-pay | Admitting: Cardiology

## 2020-11-10 ENCOUNTER — Other Ambulatory Visit (HOSPITAL_COMMUNITY): Payer: Self-pay

## 2020-11-11 ENCOUNTER — Other Ambulatory Visit (HOSPITAL_COMMUNITY): Payer: Self-pay

## 2020-11-11 MED ORDER — SIMVASTATIN 40 MG PO TABS
40.0000 mg | ORAL_TABLET | Freq: Every day | ORAL | 3 refills | Status: DC
Start: 2020-11-11 — End: 2020-11-18
  Filled 2020-11-11: qty 90, 90d supply, fill #0

## 2020-11-11 MED ORDER — CARVEDILOL 6.25 MG PO TABS
6.2500 mg | ORAL_TABLET | Freq: Two times a day (BID) | ORAL | 3 refills | Status: AC
  Filled 2020-11-11: qty 180, 90d supply, fill #0
  Filled 2021-03-01: qty 180, 90d supply, fill #1
  Filled 2021-06-11: qty 180, 90d supply, fill #2
  Filled 2021-09-14: qty 180, 90d supply, fill #3

## 2020-11-11 MED ORDER — SPIRONOLACTONE 25 MG PO TABS
25.0000 mg | ORAL_TABLET | Freq: Every day | ORAL | 3 refills | Status: DC
Start: 2020-11-11 — End: 2021-06-04
  Filled 2020-11-11: qty 90, 90d supply, fill #0

## 2020-11-16 ENCOUNTER — Other Ambulatory Visit (HOSPITAL_COMMUNITY): Payer: Self-pay

## 2020-11-18 ENCOUNTER — Ambulatory Visit (HOSPITAL_BASED_OUTPATIENT_CLINIC_OR_DEPARTMENT_OTHER)
Admission: RE | Admit: 2020-11-18 | Discharge: 2020-11-18 | Disposition: A | Discharge status: Home / Self Care | Payer: Medicare HMO | Source: Ambulatory Visit | Attending: Cardiology | Admitting: Cardiology

## 2020-11-18 ENCOUNTER — Other Ambulatory Visit (HOSPITAL_COMMUNITY): Payer: Self-pay

## 2020-11-18 ENCOUNTER — Ambulatory Visit (HOSPITAL_COMMUNITY)
Admission: RE | Admit: 2020-11-18 | Discharge: 2020-11-18 | Disposition: A | Discharge status: Home / Self Care | Payer: Medicare HMO | Source: Ambulatory Visit | Attending: Cardiology | Admitting: Cardiology

## 2020-11-18 ENCOUNTER — Encounter (HOSPITAL_COMMUNITY): Payer: Self-pay | Admitting: Cardiology

## 2020-11-18 ENCOUNTER — Other Ambulatory Visit: Payer: Self-pay

## 2020-11-18 VITALS — BP 132/78 | HR 95 | Ht 59.0 in | Wt 177.0 lb

## 2020-11-18 DIAGNOSIS — Z8249 Family history of ischemic heart disease and other diseases of the circulatory system: Secondary | ICD-10-CM | POA: Diagnosis not present

## 2020-11-18 DIAGNOSIS — Z950 Presence of cardiac pacemaker: Secondary | ICD-10-CM | POA: Diagnosis not present

## 2020-11-18 DIAGNOSIS — Z7982 Long term (current) use of aspirin: Secondary | ICD-10-CM | POA: Insufficient documentation

## 2020-11-18 DIAGNOSIS — I442 Atrioventricular block, complete: Secondary | ICD-10-CM | POA: Diagnosis not present

## 2020-11-18 DIAGNOSIS — N189 Chronic kidney disease, unspecified: Secondary | ICD-10-CM | POA: Diagnosis not present

## 2020-11-18 DIAGNOSIS — E7800 Pure hypercholesterolemia, unspecified: Secondary | ICD-10-CM

## 2020-11-18 DIAGNOSIS — I5022 Chronic systolic (congestive) heart failure: Secondary | ICD-10-CM | POA: Diagnosis present

## 2020-11-18 DIAGNOSIS — Z86711 Personal history of pulmonary embolism: Secondary | ICD-10-CM | POA: Insufficient documentation

## 2020-11-18 DIAGNOSIS — I130 Hypertensive heart and chronic kidney disease with heart failure and stage 1 through stage 4 chronic kidney disease, or unspecified chronic kidney disease: Secondary | ICD-10-CM | POA: Diagnosis not present

## 2020-11-18 DIAGNOSIS — I428 Other cardiomyopathies: Secondary | ICD-10-CM | POA: Diagnosis not present

## 2020-11-18 DIAGNOSIS — F32A Depression, unspecified: Secondary | ICD-10-CM | POA: Insufficient documentation

## 2020-11-18 DIAGNOSIS — Z79899 Other long term (current) drug therapy: Secondary | ICD-10-CM | POA: Diagnosis not present

## 2020-11-18 DIAGNOSIS — E039 Hypothyroidism, unspecified: Secondary | ICD-10-CM | POA: Insufficient documentation

## 2020-11-18 LAB — BASIC METABOLIC PANEL
Anion gap: 7 (ref 5–15)
BUN: 7 mg/dL — ABNORMAL LOW (ref 8–23)
CO2: 27 mmol/L (ref 22–32)
Calcium: 9.7 mg/dL (ref 8.9–10.3)
Chloride: 104 mmol/L (ref 98–111)
Creatinine, Ser: 0.99 mg/dL (ref 0.44–1.00)
GFR, Estimated: >60 mL/min (ref >60)
Glucose, Bld: 112 mg/dL — ABNORMAL HIGH (ref 70–99)
Potassium: 3.9 mmol/L (ref 3.5–5.1)
Sodium: 138 mmol/L (ref 135–145)

## 2020-11-18 LAB — CBC
HCT: 44.8 % (ref 36.0–46.0)
Hemoglobin: 14.1 g/dL (ref 12.0–15.0)
MCH: 30.3 pg (ref 26.0–34.0)
MCHC: 31.5 g/dL (ref 30.0–36.0)
MCV: 96.1 fL (ref 80.0–100.0)
Platelets: 413 10*3/uL — ABNORMAL HIGH (ref 150–400)
RBC: 4.66 MIL/uL (ref 3.87–5.11)
RDW: 13.4 % (ref 11.5–15.5)
WBC: 9.8 10*3/uL (ref 4.0–10.5)
nRBC: 0 % (ref 0.0–0.2)

## 2020-11-18 LAB — LIPID PANEL
Cholesterol: 203 mg/dL — ABNORMAL HIGH (ref 0–200)
HDL: 78 mg/dL (ref >40)
LDL Cholesterol: 109 mg/dL — ABNORMAL HIGH (ref 0–99)
Total CHOL/HDL Ratio: 2.6 RATIO
Triglycerides: 80 mg/dL (ref <150)
VLDL: 16 mg/dL (ref 0–40)

## 2020-11-18 LAB — ECHOCARDIOGRAM COMPLETE
AR max vel: 1.76 cm2
AV Area VTI: 1.7 cm2
AV Area mean vel: 1.7 cm2
AV Mean grad: 2 mmHg
AV Peak grad: 4 mmHg
Ao pk vel: 1 m/s
Area-P 1/2: 5.02 cm2
S' Lateral: 2 cm

## 2020-11-18 LAB — TSH: TSH: 1.84 u[IU]/mL (ref 0.350–4.500)

## 2020-11-18 MED ORDER — ROSUVASTATIN CALCIUM 10 MG PO TABS
10.0000 mg | ORAL_TABLET | Freq: Every day | ORAL | 11 refills | Status: DC
Start: 2020-11-18 — End: 2021-06-04
  Filled 2020-11-18: qty 30, 30d supply, fill #0
  Filled 2020-12-30: qty 30, 30d supply, fill #1

## 2020-11-18 NOTE — Patient Instructions (Signed)
EKG done today.  Labs done today. We will contact you only if your labs are abnormal.  STOP taking Simvastatin  START Crestor 10mg  (1 tablet) by mouth daily.   No other medication changes were made. Please continue all current medications as prescribed.  Your physician recommends that you schedule a follow-up appointment in: 6 months. Please contact our office in December to schedule a January appointment.   If you have any questions or concerns before your next appointment please send February a message through [place] or call our office at [phone].    TO LEAVE A MESSAGE FOR THE NURSE SELECT OPTION 2, PLEASE LEAVE A MESSAGE INCLUDING: YOUR NAME DATE OF BIRTH CALL BACK NUMBER REASON FOR CALL**this is important as we prioritize the call backs  YOU WILL RECEIVE A CALL BACK THE SAME DAY AS LONG AS YOU CALL BEFORE 4:00 PM   Do the following things EVERYDAY: Weigh yourself in the morning before breakfast. Write it down and keep it in a log. Take your medicines as prescribed Eat low salt foods--Limit salt (sodium) to 2000 mg per day.  Stay as active as you can everyday Limit all fluids for the day to less than 2 liters   At the [facility], you and your health needs are our priority. As part of our continuing mission to provide you with exceptional heart care, we have created designated Provider Care Teams. These Care Teams include your primary Cardiologist (physician) and Advanced Practice Providers (APPs- Physician Assistants and Nurse Practitioners) who all work together to provide you with the care you need, when you need it.   You may see any of the following providers on your designated Care Team at your next follow up: Dr [phone] Dr Arvilla Meres, NP Carron Curie, Robbie Lis [place], PharmD   Please be sure to bring in all your medications bottles to every appointment.

## 2020-11-18 NOTE — Progress Notes (Signed)
  Echocardiogram 2D Echocardiogram has been performed.  Gerda Diss 11/18/2020, 1:48 PM

## 2020-11-19 NOTE — Progress Notes (Signed)
Patient ID: Stacy Mcintosh, female   DOB: Aug 20, 1955, 65 y.o.   MRN: [id number] PCP: Deneen Harts Cardiology: Dr. Shirlee Latch  65 y.o. with history of hypothyroidism, nonischemic cardiomyopathy, and PE.  Hypothyroidism has been controlled by [place] thyroid.  She is now seeing Dr. Lucianne Muss for endocrine management.  Echo in 5/17 showed EF 35-40% with diffuse hypokinesis, repeat echo in 01/2016 with improved EF of 60-65%.   In 10/17, she was found to have complete heart block and a Medtronic dual chamber PPM was placed. Last echo in 10/18 showed EF 45% with apical hypokinesis.  She had a Cardiolite in 11/18 with EF 58%, small reversible apical defect, overall low risk study. Echo in 2/19 showed EF 45-50% with apical hypokinesis.  She saw Dr. Elberta Fortis who did not recommend CRT upgrade as EF was only very mildly decreased.   LHC/RHC (5/19) showed no CAD, optimized filling pressures.   Echo in 11/19 showed EF 45-50%, mild LVH, normal RV.  Echo in 9/20 with EF up to 55%.  Echo was done today and reviewed, EF 55%, mild LVH, normal RV, normal IVC.   She returns today for followup of CHF.  She is taking Lasix prn.  She has chronic fatigue/tiredness.  She sometimes uses a walker to go long distances for balance.  No dyspnea unless she walks up stairs, inclines.  No orthopnea/PND. Went camping recently.  She has a chronic cough, reflux medication helps it .  ECG (personally reviewed): NSR, BiV pacing  Labs (10/10): BNP 13, K 3.7, creatinine 0.8 Labs (7/11): K 4.1, creatinine 0.6 Labs (7/12): LDL 143, HDL 74, K 3.3, creatinine 1.0, TSH normal Labs (12/12): K 4, creatinine 0.8, HDL 85, LDL 107 Labs (11/15): K 4.2, creatinine 0.74, LDL 78, HDL 76 Labs (4/17): TSH normal, K 4.5, creatinine 0.92, HCT 42.2 Labs (5/17): LDL 105, HDL 72, BNP 261 Labs (6/17): D dimer negative, BNP 64 Labs (8/17): K 4.2, creatinine 1.05, TSH 0.329 (low), K 4.2, creatinine 1.05, hgb 15.2, BNP 25 Labs (10/17): K 3.5, creatinine  1.06 Labs (9/18): K 3.6, creatinine 1.07, TSH normal Labs (2/19): K 3.6, creatinine 0.99 Labs (6/20): K 3.8, creatinine 1.18, TSH normal Labs (10/21): K 4.1, creatinine 1.04  Allergies (verified):  1)  ! Lisinopril  Past Medical History: 1. Hypothyroidism and history of Graves disease.  She is status post radioactive iodine therapy.  She then developed hypothyroidism, and the hypothyroidism has been difficult to control.   2. Cardiomyopathy, nonischemic.  The patient had a cardiac MRI done during her hospitalization in July 2009.  This showed moderate LV enlargement and global LV hypokinesis.  There was a septal paradox pattern.  EF was calculated to be 27%.  There was mild left atrial enlargement.  The RV was normal in size and function.  There was no ASD or VSD.  There was no evidence for delayed enhancement pattern  in the LV myocardium suggesting that there was no scar from coronary artery disease and no definite evidence for infiltrative cardiomyopathy.  HIV was negative.  She did have an echo done also in the hospital in July 2009 that showed severe mitral regurgitation.  A TEE done following this showed an EF of 25-30% with severe mitral regurgitation.  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  The patient did have a left and right heart catheterization after treatment for congestive heart failure in January 2010.  There was no angiographic coronary disease.  EF  had improved to 55-60% with no significant mitral  regurgitation.  The hemodynamics were totally normal on the right heart catheterization.  The cardiomyopathy, I think, was likely due to poorly controlled hypothyroidism.   - Echo (8/10): EF 50-55%, mild LV dilatation, mild-mod MR, PASP 32 - Echo (11/15): EF 30% - Echo (5/17): EF 35-40%, mild-moderate TR, moderate MR, PASP 50 mmHg.  - Echo (10/17): EF 60-65%. Mild TR  - Echo (10/18): EF 45%, apical hypokinesis.  - Cardiolite (11/18): EF 58%, reversible apical  defect (small), cannot rule out ischemia, low risk overall.  - Echo (2/19): EF 45-50%, apical hypokinesis - [facility] (5/19): No significant coronary disease. Mean RA 5, PA 27/9, PCWP 9, CI 2.91.  - Echo (11/19): EF 45-50%, mild LVH, normal RV.  - Echo (9/20): EF 55%, mild RV dilation with normal systolic function.  - Echo (7/22): EF 55%, mild LVH, normal RV, normal IVC. 3. PE/DVT.  July 2009.  The patient had been very sedentary prior to this due to her severe hypothyroidism.  She had  normal antithrombin III.  She was negative for factor V Leiden.  She had normal protein C and protein S function.  Now off coumadin.  4. Hypertension. 5. History of left bundle-branch block. 6. Asthma. 7. Migraines. 8. ACEI cough 9. Low back pain: L-spine disease.  10. Lyme disease: Treated in 12/16.  11. Complete heart block with Medtronic dual chamber PPM.  12. 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   Current Outpatient Medications  Medication Sig Dispense Refill   aspirin EC 81 MG tablet Take 81 mg by mouth daily.      carvedilol (COREG) 6.25 MG tablet Take 1 tablet (6.25 mg total) by mouth 2 (two) times daily with a meal. 180 tablet 3   doxepin (SINEQUAN) 10 MG capsule Take 20 mg by mouth at bedtime.     furosemide (LASIX) 40 MG tablet TAKE 1 TABLET (40 MG TOTAL) BY MOUTH DAILY. MAY TAKE AN ADDITIONAL 40MG  DAILY FOR FLUID. 115 tablet 0   lamoTRIgine (LAMICTAL) 25 MG tablet      pantoprazole (PROTONIX) 40 MG tablet Take 1 tablet (40 mg total) by mouth daily. 90 tablet 3   rosuvastatin (CRESTOR) 10 MG tablet Take 1 tablet  (10 mg total) by mouth daily. 30 tablet 11   sacubitril-valsartan (ENTRESTO) 49-51 MG Take 1 tablet by mouth 2 (two) times daily. 180 tablet 3   spironolactone (ALDACTONE) 25 MG tablet Take 1 tablet (25 mg total) by mouth daily. 90 tablet 3   thyroid (ARMOUR THYROID) 60 MG tablet TAKE 1 TABLET (60 MG TOTAL) BY MOUTH DAILY BEFORE BREAKFAST. 90 tablet 3   tiZANidine (ZANAFLEX) 4 MG tablet Take 4 mg by mouth 3 (three) times daily as needed. 1/2tqb prn  6   UNABLE TO FIND Med Name: otc lung tonic     Vitamin D, Ergocalciferol, (DRISDOL) 1.25 MG (50000 UNIT) CAPS capsule Take 50,000 Units by mouth every 7 (seven) days.     gabapentin (NEURONTIN) 300 MG capsule Take 300 mg by mouth 3 (three) times daily. (Patient not taking: Reported on 11/18/2020)     No current facility-administered medications for this  encounter.    BP 132/78   Pulse 95   Ht 4\' 11"  (1.499 m)   Wt 80.3 kg (177 lb)   SpO2 97%   BMI 35.75 kg/m  General: NAD Neck: No JVD, no thyromegaly or thyroid nodule.  Lungs: Clear to auscultation bilaterally with normal respiratory effort. CV: Nondisplaced PMI.  Heart regular S1/S2, no S3/S4, no murmur.  No peripheral edema.  No carotid bruit.  Normal pedal pulses.  Abdomen: Soft, nontender, no hepatosplenomegaly, no distention.  Skin: Intact without lesions or rashes.  Neurologic: Alert and oriented x 3.  Psych: Normal affect. Extremities: No clubbing or cyanosis.  HEENT: Normal.   Assessment/Plan:  1. Chronic systolic CHF:  Patient has a nonischemic cardiomyopathy that was thought initially to be due to uncontrolled hypothyroidism. Cardiac MRI in 2009 showed no LGE.  EF improved to 50-55% on echo in 2010. Cath in 2010 showed normal coronaries.  However, EF was back down to 30% in 11/15 and was 35-40% in 5/17.  Echo in 01/2016 with improvement to EF 60-65%.  However, 10/18 echo showed fall in EF to 45% with apical wall motion abnormality.  Cardiolite in 11/18 showed a small area of  possible apical ischemia and EF 58%. 5/19 LHC/RHC showed no CAD, normal filling pressures and cardiac output.  Echo in 9/20 showed EF up to 55%. She is now RV pacing 99% of the time because of PPM implanted for CHB, Dr 10/20 opted against CRT upgrade as EF was only mildly depressed.  Echo today shows EF stable at 55% with normal RV.  Ongoing NYHA class III symptoms with prominent fatigue. She is not volume overloaded on exam.  - Continue Spiro 25 mg daily. BMET today.  - Continue Entresto 49/51 bid.   - Continue Coreg 6.25 mg bid.  - She can continue to use Lasix prn.   2. Hypothyroidism: Per Dr. Elberta Fortis.  3. HYPERCHOLESTEROLEMIA:  - She wants to stop simvastatin, will switch her over to Crestor 10 mg daily with lipids/LFTs in 2 months.  4. Pulmonary embolism: In 2009. No longer taking Coumadin.  - Continue daily ASA.  5. Complete heart block: Medtronic PPM, See above.  6. Depression: Think this plays a large role in her symptoms  Followup 6 months   2010, MD  11/19/2020

## 2020-11-26 NOTE — Progress Notes (Signed)
Remote pacemaker transmission.   

## 2020-12-30 ENCOUNTER — Other Ambulatory Visit (HOSPITAL_COMMUNITY): Payer: Self-pay

## 2021-01-07 ENCOUNTER — Other Ambulatory Visit (HOSPITAL_COMMUNITY): Payer: Self-pay

## 2021-01-19 ENCOUNTER — Other Ambulatory Visit (HOSPITAL_COMMUNITY): Payer: Self-pay

## 2021-01-26 ENCOUNTER — Other Ambulatory Visit (HOSPITAL_COMMUNITY): Payer: Self-pay

## 2021-02-02 ENCOUNTER — Ambulatory Visit (INDEPENDENT_AMBULATORY_CARE_PROVIDER_SITE_OTHER): Payer: Medicare HMO

## 2021-02-02 DIAGNOSIS — I442 Atrioventricular block, complete: Secondary | ICD-10-CM | POA: Diagnosis not present

## 2021-02-02 LAB — CUP PACEART REMOTE DEVICE CHECK
Battery Remaining Longevity: 53 mo
Battery Voltage: 2.99 V
Brady Statistic AP VP Percent: 5.16 %
Brady Statistic AP VS Percent: 0.18 %
Brady Statistic AS VP Percent: 93.42 %
Brady Statistic AS VS Percent: 1.24 %
Brady Statistic RA Percent Paced: 5.29 %
Brady Statistic RV Percent Paced: 98.15 %
Date Time Interrogation Session: 20221011144321
Implantable Lead Implant Date: 20171024
Implantable Lead Implant Date: 20171024
Implantable Lead Location: 753859
Implantable Lead Location: 753860
Implantable Lead Model: 5076
Implantable Lead Model: 5076
Implantable Pulse Generator Implant Date: 20171024
Lead Channel Impedance Value: 361 Ohm
Lead Channel Impedance Value: 418 Ohm
Lead Channel Impedance Value: 418 Ohm
Lead Channel Impedance Value: 475 Ohm
Lead Channel Pacing Threshold Amplitude: 0.5 V
Lead Channel Pacing Threshold Amplitude: 0.875 V
Lead Channel Pacing Threshold Pulse Width: 0.4 ms
Lead Channel Pacing Threshold Pulse Width: 0.4 ms
Lead Channel Sensing Intrinsic Amplitude: 18.25 mV
Lead Channel Sensing Intrinsic Amplitude: 18.25 mV
Lead Channel Sensing Intrinsic Amplitude: 2.125 mV
Lead Channel Sensing Intrinsic Amplitude: 2.125 mV
Lead Channel Setting Pacing Amplitude: 2 V
Lead Channel Setting Pacing Amplitude: 2.5 V
Lead Channel Setting Pacing Pulse Width: 0.4 ms
Lead Channel Setting Sensing Sensitivity: 2 mV

## 2021-02-08 ENCOUNTER — Other Ambulatory Visit (HOSPITAL_COMMUNITY): Payer: Self-pay

## 2021-02-10 NOTE — Progress Notes (Signed)
Remote pacemaker transmission.   

## 2021-03-01 ENCOUNTER — Telehealth (HOSPITAL_COMMUNITY): Payer: Self-pay | Admitting: Pharmacy Technician

## 2021-03-01 ENCOUNTER — Other Ambulatory Visit (HOSPITAL_COMMUNITY): Payer: Self-pay

## 2021-03-01 NOTE — Telephone Encounter (Signed)
Advanced Heart Failure Patient Advocate Encounter  Spoke to patient regarding re-enrollment of Entresto assistance with [employer]. Patient has application and will bring to the office along with POI.  Will fax in once signatures are obtained.

## 2021-03-08 ENCOUNTER — Telehealth (HOSPITAL_COMMUNITY): Payer: Self-pay

## 2021-03-08 ENCOUNTER — Other Ambulatory Visit (HOSPITAL_COMMUNITY): Payer: Self-pay

## 2021-03-08 NOTE — Telephone Encounter (Signed)
Patient has questions about medications and would like a call back. Should she reduce spironolactone?  She has not been taking her cholesterol mediation.  She would like to know if she can try an alternative.

## 2021-03-11 NOTE — Telephone Encounter (Signed)
Message sent to me on 11/14 I was out of the office until 11/16. I called pt today no answer and vm not set up.

## 2021-03-16 ENCOUNTER — Other Ambulatory Visit (HOSPITAL_COMMUNITY): Payer: Self-pay | Admitting: *Deleted

## 2021-03-16 MED ORDER — ENTRESTO 49-51 MG PO TABS
1.0000 | ORAL_TABLET | Freq: Two times a day (BID) | ORAL | 3 refills | Status: DC
Start: 2021-03-16 — End: 2021-06-04

## 2021-03-24 NOTE — Telephone Encounter (Signed)
Advanced Heart Failure Patient Advocate Encounter  Sent in application via fax, 11/23.  Will follow up.  

## 2021-05-04 ENCOUNTER — Ambulatory Visit (INDEPENDENT_AMBULATORY_CARE_PROVIDER_SITE_OTHER): Payer: Medicare HMO

## 2021-05-04 DIAGNOSIS — I442 Atrioventricular block, complete: Secondary | ICD-10-CM | POA: Diagnosis not present

## 2021-05-05 LAB — CUP PACEART REMOTE DEVICE CHECK
Battery Remaining Longevity: 46 mo
Battery Voltage: 2.98 V
Brady Statistic AP VP Percent: 2.77 %
Brady Statistic AP VS Percent: 0.07 %
Brady Statistic AS VP Percent: 95.69 %
Brady Statistic AS VS Percent: 1.47 %
Brady Statistic RA Percent Paced: 2.82 %
Brady Statistic RV Percent Paced: 98.11 %
Date Time Interrogation Session: 20230111164330
Implantable Lead Implant Date: 20171024
Implantable Lead Implant Date: 20171024
Implantable Lead Location: 753859
Implantable Lead Location: 753860
Implantable Lead Model: 5076
Implantable Lead Model: 5076
Implantable Pulse Generator Implant Date: 20171024
Lead Channel Impedance Value: 342 Ohm
Lead Channel Impedance Value: 399 Ohm
Lead Channel Impedance Value: 399 Ohm
Lead Channel Impedance Value: 475 Ohm
Lead Channel Pacing Threshold Amplitude: 0.375 V
Lead Channel Pacing Threshold Amplitude: 0.875 V
Lead Channel Pacing Threshold Pulse Width: 0.4 ms
Lead Channel Pacing Threshold Pulse Width: 0.4 ms
Lead Channel Sensing Intrinsic Amplitude: 1.125 mV
Lead Channel Sensing Intrinsic Amplitude: 1.125 mV
Lead Channel Sensing Intrinsic Amplitude: 15 mV
Lead Channel Sensing Intrinsic Amplitude: 15 mV
Lead Channel Setting Pacing Amplitude: 2 V
Lead Channel Setting Pacing Amplitude: 2.5 V
Lead Channel Setting Pacing Pulse Width: 0.4 ms
Lead Channel Setting Sensing Sensitivity: 2 mV

## 2021-05-13 NOTE — Progress Notes (Signed)
Remote pacemaker transmission.   

## 2021-05-28 ENCOUNTER — Other Ambulatory Visit (HOSPITAL_BASED_OUTPATIENT_CLINIC_OR_DEPARTMENT_OTHER): Payer: Self-pay

## 2021-06-04 ENCOUNTER — Encounter (HOSPITAL_COMMUNITY): Payer: Self-pay | Admitting: Cardiology

## 2021-06-04 ENCOUNTER — Other Ambulatory Visit: Payer: Self-pay

## 2021-06-04 ENCOUNTER — Ambulatory Visit (HOSPITAL_COMMUNITY)
Admission: RE | Admit: 2021-06-04 | Discharge: 2021-06-04 | Disposition: A | Discharge status: Home / Self Care | Payer: Medicare HMO | Source: Ambulatory Visit | Attending: Cardiology | Admitting: Cardiology

## 2021-06-04 VITALS — BP 140/80 | HR 91 | Wt 179.6 lb

## 2021-06-04 DIAGNOSIS — I442 Atrioventricular block, complete: Secondary | ICD-10-CM | POA: Insufficient documentation

## 2021-06-04 DIAGNOSIS — F32A Depression, unspecified: Secondary | ICD-10-CM | POA: Diagnosis not present

## 2021-06-04 DIAGNOSIS — Z86711 Personal history of pulmonary embolism: Secondary | ICD-10-CM | POA: Diagnosis not present

## 2021-06-04 DIAGNOSIS — I5022 Chronic systolic (congestive) heart failure: Secondary | ICD-10-CM | POA: Insufficient documentation

## 2021-06-04 DIAGNOSIS — E7800 Pure hypercholesterolemia, unspecified: Secondary | ICD-10-CM | POA: Diagnosis not present

## 2021-06-04 DIAGNOSIS — I428 Other cardiomyopathies: Secondary | ICD-10-CM | POA: Insufficient documentation

## 2021-06-04 DIAGNOSIS — Z950 Presence of cardiac pacemaker: Secondary | ICD-10-CM | POA: Insufficient documentation

## 2021-06-04 DIAGNOSIS — Z7982 Long term (current) use of aspirin: Secondary | ICD-10-CM | POA: Diagnosis not present

## 2021-06-04 DIAGNOSIS — I110 Hypertensive heart disease with heart failure: Secondary | ICD-10-CM | POA: Diagnosis not present

## 2021-06-04 DIAGNOSIS — Z79899 Other long term (current) drug therapy: Secondary | ICD-10-CM | POA: Diagnosis not present

## 2021-06-04 DIAGNOSIS — E039 Hypothyroidism, unspecified: Secondary | ICD-10-CM | POA: Insufficient documentation

## 2021-06-04 LAB — LIPID PANEL
Cholesterol: 242 mg/dL — ABNORMAL HIGH (ref 0–200)
HDL: 86 mg/dL (ref >40)
LDL Cholesterol: 139 mg/dL — ABNORMAL HIGH (ref 0–99)
Total CHOL/HDL Ratio: 2.8 RATIO
Triglycerides: 87 mg/dL (ref <150)
VLDL: 17 mg/dL (ref 0–40)

## 2021-06-04 LAB — BASIC METABOLIC PANEL
Anion gap: 10 (ref 5–15)
BUN: 7 mg/dL — ABNORMAL LOW (ref 8–23)
CO2: 26 mmol/L (ref 22–32)
Calcium: 9.7 mg/dL (ref 8.9–10.3)
Chloride: 101 mmol/L (ref 98–111)
Creatinine, Ser: 1.03 mg/dL — ABNORMAL HIGH (ref 0.44–1.00)
GFR, Estimated: 60 mL/min — ABNORMAL LOW (ref >60)
Glucose, Bld: 101 mg/dL — ABNORMAL HIGH (ref 70–99)
Potassium: 4.3 mmol/L (ref 3.5–5.1)
Sodium: 137 mmol/L (ref 135–145)

## 2021-06-04 MED ORDER — ENTRESTO 97-103 MG PO TABS: 1.0000 | ORAL_TABLET | Freq: Two times a day (BID) | ORAL | 3 refills | Status: AC

## 2021-06-04 NOTE — Patient Instructions (Signed)
Medication Changes:   Increase Entresto to 97/103 Twice daily   Lab Work:  Labs done today, your results will be available in MyChart, we will contact you for abnormal readings.   Testing/Procedures:  Your physician has requested that you have an echocardiogram. Echocardiography is a painless test that uses sound waves to create images of your heart. It provides your doctor with information about the size and shape of your heart and how well your hearts chambers and valves are working. This procedure takes approximately one hour. There are no restrictions for this procedure.   Referrals:  none  Special Instructions // Education:  none  Follow-Up in: 6 months (August 2023) ** call office in June to make follow up appointment**  At [facility], you and your health needs are our priority. We have a designated team specialized in the treatment of Heart Failure. This Care Team includes your primary Heart Failure Specialized Cardiologist (physician), Advanced Practice Providers (APPs- Physician Assistants and Nurse Practitioners), and Pharmacist who all work together to provide you with the care you need, when you need it.   You may see any of the following providers on your designated Care Team at your next follow up:  Dr Arvilla Meres Dr Carron Curie, NP Robbie Lis, [place] [facility], [place] Karle Plumber, PharmD   Please be sure to bring in all your medications bottles to every appointment.   Need to Contact us:  If you have any questions or concerns before your next appointment please send [place] a message through [place] or call our office at [phone].    TO LEAVE A MESSAGE FOR THE NURSE SELECT OPTION 2, PLEASE LEAVE A MESSAGE INCLUDING: YOUR NAME DATE OF BIRTH CALL BACK NUMBER REASON FOR CALL**this is important as we prioritize the call backs  YOU WILL RECEIVE A CALL BACK THE SAME DAY AS LONG AS YOU CALL BEFORE  4:00 PM

## 2021-06-06 NOTE — Progress Notes (Signed)
Patient ID: Stacy Mcintosh, female   DOB: 1955/11/24, 66 y.o.   MRN: [id number] PCP: Deneen Harts Cardiology: Dr. Shirlee Latch  66 y.o. with history of hypothyroidism, nonischemic cardiomyopathy, and PE.  Hypothyroidism has been controlled by [place] thyroid.  She is now seeing Dr. Lucianne Muss for endocrine management.  Echo in 5/17 showed EF 35-40% with diffuse hypokinesis, repeat echo in 01/2016 with improved EF of 60-65%.   In 10/17, she was found to have complete heart block and a Medtronic dual chamber PPM was placed. Last echo in 10/18 showed EF 45% with apical hypokinesis.  She had a Cardiolite in 11/18 with EF 58%, small reversible apical defect, overall low risk study. Echo in 2/19 showed EF 45-50% with apical hypokinesis.  She saw Dr. Elberta Fortis who did not recommend CRT upgrade as EF was only very mildly decreased.   LHC/RHC (5/19) showed no CAD, optimized filling pressures.   Echo in 11/19 showed EF 45-50%, mild LVH, normal RV.  Echo in 9/20 with EF up to 55%.  Echo in 7/22 showed EF 55%, mild LVH, normal RV, normal IVC.   She returns today for followup of CHF.  She stopped spironolactone due to dizziness, felt better after stopping.  SBP generally in 140s-150s when she checks at home now.  She is no longer using a walker.  No lightheadedness.  No significant exertional dyspnea.  No orthopnea/PND.  She is taking Lasix only prn. Weight up 2 lbs.  No chest pain.    MDT device interrogation: 97% RV pacing  Labs (10/10): BNP 13, K 3.7, creatinine 0.8 Labs (7/11): K 4.1, creatinine 0.6 Labs (7/12): LDL 143, HDL 74, K 3.3, creatinine 1.0, TSH normal Labs (12/12): K 4, creatinine 0.8, HDL 85, LDL 107 Labs (11/15): K 4.2, creatinine 0.74, LDL 78, HDL 76 Labs (4/17): TSH normal, K 4.5, creatinine 0.92, HCT 42.2 Labs (5/17): LDL 105, HDL 72, BNP 261 Labs (6/17): D dimer negative, BNP 64 Labs (8/17): K 4.2, creatinine 1.05, TSH 0.329 (low), K 4.2, creatinine 1.05, hgb 15.2, BNP 25 Labs (10/17): K  3.5, creatinine 1.06 Labs (9/18): K 3.6, creatinine 1.07, TSH normal Labs (2/19): K 3.6, creatinine 0.99 Labs (6/20): K 3.8, creatinine 1.18, TSH normal Labs (10/21): K 4.1, creatinine 1.04 Labs (7/22): K 3.9, creatinine 0.99, LDL 109  Allergies (verified):  1)  ! Lisinopril  Past Medical History: 1. Hypothyroidism and history of Graves disease.  She is status post radioactive iodine therapy.  She then developed hypothyroidism, and the hypothyroidism has been difficult to control.   2. Cardiomyopathy, nonischemic.  The patient had a cardiac MRI done during her hospitalization in July 2009.  This showed moderate LV enlargement and global LV hypokinesis.  There was a septal paradox pattern.  EF was calculated to be 27%.  There was mild left atrial enlargement.  The RV was normal in size and function.  There was no ASD or VSD.  There was no evidence for delayed enhancement pattern  in the LV myocardium suggesting that there was no scar from coronary artery disease and no definite evidence for infiltrative cardiomyopathy.  HIV was negative.  She did have an echo done also in the hospital in July 2009 that showed severe mitral regurgitation.  A TEE done following this showed an EF of 25-30% with severe mitral regurgitation.  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  The patient did have a left and right heart catheterization after treatment for  congestive heart failure in January 2010.  There was no angiographic coronary disease.  EF had improved to 55-60% with no significant mitral  regurgitation.  The hemodynamics were totally normal on the right heart catheterization.  The cardiomyopathy, I think, was likely due to poorly controlled hypothyroidism.   - Echo (8/10): EF 50-55%, mild LV dilatation, mild-mod MR, PASP 32 - Echo (11/15): EF 30% - Echo (5/17): EF 35-40%, mild-moderate TR, moderate MR, PASP 50 mmHg.  - Echo (10/17): EF 60-65%. Mild TR  - Echo (10/18): EF 45%, apical  hypokinesis.  - Cardiolite (11/18): EF 58%, reversible apical defect (small), cannot rule out ischemia, low risk overall.  - Echo (2/19): EF 45-50%, apical hypokinesis - [facility] (5/19): No significant coronary disease. Mean RA 5, PA 27/9, PCWP 9, CI 2.91.  - Echo (11/19): EF 45-50%, mild LVH, normal RV.  - Echo (9/20): EF 55%, mild RV dilation with normal systolic function.  - Echo (7/22): EF 55%, mild LVH, normal RV, normal IVC. 3. PE/DVT.  July 2009.  The patient had been very sedentary prior to this due to her severe hypothyroidism.  She had  normal antithrombin III.  She was negative for factor V Leiden.  She had normal protein C and protein S function.  Now off coumadin.  4. Hypertension. 5. History of left bundle-branch block. 6. Asthma. 7. Migraines. 8. ACEI cough 9. Low back pain: L-spine disease.  10. Lyme disease: Treated in 12/16.  11. Complete heart block with Medtronic dual chamber PPM.  12. 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   Current Outpatient Medications  Medication Sig Dispense Refill   aspirin EC 81 MG tablet Take 81 mg by mouth daily.      carvedilol (COREG) 6.25 MG tablet Take 1 tablet (6.25 mg total) by mouth 2 (two) times daily with a meal. 180 tablet 3   doxepin (SINEQUAN) 50 MG capsule Take 50 mg by mouth at bedtime.     furosemide (LASIX) 40 MG tablet TAKE 1 TABLET (40 MG TOTAL) BY MOUTH DAILY. MAY TAKE AN ADDITIONAL 40MG  DAILY FOR FLUID. 115 tablet 0   lamoTRIgine (LAMICTAL) 25 MG tablet Take 50 mg by mouth daily.     Misc Natural Products (LUNG TONIC PO) Take 1 tablet by  mouth 2 (two) times daily.     pantoprazole (PROTONIX) 40 MG tablet Take 1 tablet (40 mg total) by mouth daily. 90 tablet 3   sacubitril-valsartan (ENTRESTO) 97-103 MG Take 1 tablet by mouth 2 (two) times daily. 180 tablet 3   thyroid (ARMOUR THYROID) 60 MG tablet TAKE 1 TABLET (60 MG TOTAL) BY MOUTH DAILY BEFORE BREAKFAST. 90 tablet 3   Vitamin D, Ergocalciferol, (DRISDOL) 1.25 MG (50000 UNIT) CAPS capsule Take 50,000 Units by mouth every 7 (seven) days.     No current facility-administered medications for this encounter.    BP 140/80    Pulse 91    Wt 81.5 kg (179 lb 9.6 oz)    SpO2 97%    BMI 36.27 kg/m  General: NAD Neck: No JVD, no thyromegaly or thyroid nodule.  Lungs: Clear to auscultation bilaterally with normal respiratory effort. CV: Nondisplaced PMI.  Heart regular S1/S2, no S3/S4, no murmur.  No peripheral edema.  No carotid bruit.  Normal pedal pulses.  Abdomen: Soft, nontender, no hepatosplenomegaly, no distention.  Skin: Intact without lesions or rashes.  Neurologic: Alert and oriented x 3.  Psych: Normal affect. Extremities: No clubbing or cyanosis.  HEENT: Normal.   Assessment/Plan:  1. Chronic systolic CHF:  Patient has a nonischemic cardiomyopathy that was thought initially to be due to uncontrolled hypothyroidism. Cardiac MRI in 2009 showed no LGE.  EF improved to 50-55% on echo in 2010. Cath in 2010 showed normal coronaries.  However, EF was back down to 30% in 11/15 and was 35-40% in 5/17.  Echo in 01/2016 with improvement to EF 60-65%.  However, 10/18 echo showed fall in EF to 45% with apical wall motion abnormality.  Cardiolite in 11/18 showed a small area of possible apical ischemia and EF 58%. 5/19 LHC/RHC showed no CAD, normal filling pressures and cardiac output.  Echo in 9/20 showed EF up to 55%. She is now RV pacing 97% of the time because of PPM implanted for CHB, Dr Elberta Fortis opted against CRT upgrade as EF was only mildly depressed.  Echo in 7/22 showed EF  stable at 55% with normal RV.  NYHA class I-II, not volume overloaded. BP running high.  - With elevated BP, will increase Entresto to 97/103 bid. BMET today and again in 10 days.  - Continue Coreg 6.25 mg bid.  - She can continue to use Lasix prn.   - I will arrange for repeat echo, follow for fall in EF with chronic RV pacing.  2. Hypothyroidism: Per Dr. Lucianne Muss.  3. HYPERCHOLESTEROLEMIA: She has not tolerated statins.   - Check lipids today, LDL significantly elevated, consider Repatha.  4. Pulmonary embolism: In 2009. No longer taking Coumadin.  - Continue daily ASA.  5. Complete heart block: Medtronic PPM, See above.  6. Depression: Think this has played a large role in her symptoms  Echo + followup in 6 months.   Marca Ancona, MD  06/06/2021

## 2021-06-07 ENCOUNTER — Telehealth (HOSPITAL_COMMUNITY): Payer: Self-pay | Admitting: Surgery

## 2021-06-07 ENCOUNTER — Other Ambulatory Visit (HOSPITAL_COMMUNITY): Payer: Self-pay

## 2021-06-07 MED ORDER — EZETIMIBE 10 MG PO TABS
10.0000 mg | ORAL_TABLET | Freq: Every day | ORAL | 3 refills | Status: DC
  Filled 2021-06-07 – 2021-09-09 (×2): qty 90, 90d supply, fill #0

## 2021-06-07 NOTE — Telephone Encounter (Signed)
I spoke with patient regarding results and recommendations.  She is aware and agreeable to new medication. CHL medication list updated and prescription sent to her pharmacy of choice.

## 2021-06-07 NOTE — Telephone Encounter (Signed)
-----   Message from Larey Dresser, MD sent at 06/06/2021  5:30 PM EST ----- High cholesterol, recommend Zetia 10 mg daily.

## 2021-06-11 ENCOUNTER — Other Ambulatory Visit (HOSPITAL_COMMUNITY): Payer: Self-pay | Admitting: Internal Medicine

## 2021-06-11 ENCOUNTER — Other Ambulatory Visit (HOSPITAL_COMMUNITY): Payer: Self-pay

## 2021-06-11 ENCOUNTER — Other Ambulatory Visit (HOSPITAL_COMMUNITY): Payer: Self-pay | Admitting: Cardiology

## 2021-06-11 MED ORDER — FUROSEMIDE 40 MG PO TABS
40.0000 mg | ORAL_TABLET | Freq: Every day | ORAL | 0 refills | Status: DC
  Filled 2021-06-11: qty 115, 58d supply, fill #0

## 2021-06-11 NOTE — Telephone Encounter (Signed)
Called [employer] to check the status of the patient's application. Representative stated that part of the application was not received. Resent application. Provided patient a 2 week supply of samples while we wait for a determination.

## 2021-06-14 ENCOUNTER — Other Ambulatory Visit (HOSPITAL_COMMUNITY): Payer: Self-pay

## 2021-06-21 ENCOUNTER — Other Ambulatory Visit (HOSPITAL_COMMUNITY): Payer: Medicare HMO

## 2021-06-22 ENCOUNTER — Other Ambulatory Visit (HOSPITAL_COMMUNITY): Payer: Medicare HMO

## 2021-06-24 NOTE — Telephone Encounter (Signed)
Advanced [facility] Patient Advocate Encounter  ? ?Patient was approved to receive Entresto from [employer] ? ?Effective dates: 06/22/21 through 04/24/22 ? ?Called and left the patient a message.  ? ?Archer Asa, CPhT ? ?

## 2021-06-30 ENCOUNTER — Other Ambulatory Visit (HOSPITAL_COMMUNITY): Payer: Medicare HMO

## 2021-07-02 ENCOUNTER — Other Ambulatory Visit (HOSPITAL_COMMUNITY): Payer: Medicare HMO

## 2021-07-07 ENCOUNTER — Ambulatory Visit (HOSPITAL_COMMUNITY)
Admission: RE | Admit: 2021-07-07 | Discharge: 2021-07-07 | Disposition: A | Discharge status: Home / Self Care | Payer: Medicare HMO | Source: Ambulatory Visit | Attending: Internal Medicine | Admitting: Internal Medicine

## 2021-07-07 ENCOUNTER — Other Ambulatory Visit: Payer: Self-pay

## 2021-07-07 ENCOUNTER — Other Ambulatory Visit: Payer: Self-pay | Admitting: Cardiology

## 2021-07-07 DIAGNOSIS — I5022 Chronic systolic (congestive) heart failure: Secondary | ICD-10-CM | POA: Insufficient documentation

## 2021-07-07 LAB — BASIC METABOLIC PANEL
Anion gap: 12 (ref 5–15)
BUN: 9 mg/dL (ref 8–23)
CO2: 24 mmol/L (ref 22–32)
Calcium: 9.3 mg/dL (ref 8.9–10.3)
Chloride: 102 mmol/L (ref 98–111)
Creatinine, Ser: 0.9 mg/dL (ref 0.44–1.00)
GFR, Estimated: >60 mL/min (ref >60)
Glucose, Bld: 105 mg/dL — ABNORMAL HIGH (ref 70–99)
Potassium: 4.4 mmol/L (ref 3.5–5.1)
Sodium: 138 mmol/L (ref 135–145)

## 2021-08-03 ENCOUNTER — Ambulatory Visit (INDEPENDENT_AMBULATORY_CARE_PROVIDER_SITE_OTHER): Payer: Medicare HMO

## 2021-08-03 DIAGNOSIS — I442 Atrioventricular block, complete: Secondary | ICD-10-CM

## 2021-08-03 LAB — CUP PACEART REMOTE DEVICE CHECK
Battery Remaining Longevity: 41 mo
Battery Voltage: 2.98 V
Brady Statistic AP VP Percent: 9.41 %
Brady Statistic AP VS Percent: 0.5 %
Brady Statistic AS VP Percent: 84.72 %
Brady Statistic AS VS Percent: 5.38 %
Brady Statistic RA Percent Paced: 9.62 %
Brady Statistic RV Percent Paced: 92.37 %
Date Time Interrogation Session: 20230411193952
Implantable Lead Implant Date: 20171024
Implantable Lead Implant Date: 20171024
Implantable Lead Location: 753859
Implantable Lead Location: 753860
Implantable Lead Model: 5076
Implantable Lead Model: 5076
Implantable Pulse Generator Implant Date: 20171024
Lead Channel Impedance Value: 380 Ohm
Lead Channel Impedance Value: 437 Ohm
Lead Channel Impedance Value: 437 Ohm
Lead Channel Impedance Value: 494 Ohm
Lead Channel Pacing Threshold Amplitude: 0.375 V
Lead Channel Pacing Threshold Amplitude: 0.875 V
Lead Channel Pacing Threshold Pulse Width: 0.4 ms
Lead Channel Pacing Threshold Pulse Width: 0.4 ms
Lead Channel Sensing Intrinsic Amplitude: 1.875 mV
Lead Channel Sensing Intrinsic Amplitude: 1.875 mV
Lead Channel Sensing Intrinsic Amplitude: 31.625 mV
Lead Channel Sensing Intrinsic Amplitude: 31.625 mV
Lead Channel Setting Pacing Amplitude: 2 V
Lead Channel Setting Pacing Amplitude: 2.5 V
Lead Channel Setting Pacing Pulse Width: 0.4 ms
Lead Channel Setting Sensing Sensitivity: 2 mV

## 2021-08-04 ENCOUNTER — Other Ambulatory Visit (HOSPITAL_BASED_OUTPATIENT_CLINIC_OR_DEPARTMENT_OTHER): Payer: Self-pay

## 2021-08-04 MED ORDER — VALACYCLOVIR HCL 1 G PO TABS
ORAL_TABLET | ORAL | 0 refills | Status: AC
  Filled 2021-08-04: qty 21, 7d supply, fill #0

## 2021-08-04 MED ORDER — GABAPENTIN 100 MG PO CAPS
ORAL_CAPSULE | ORAL | 0 refills | Status: AC
  Filled 2021-08-04: qty 21, 7d supply, fill #0

## 2021-08-16 ENCOUNTER — Other Ambulatory Visit (HOSPITAL_BASED_OUTPATIENT_CLINIC_OR_DEPARTMENT_OTHER): Payer: Self-pay

## 2021-08-16 MED ORDER — CEPHALEXIN 500 MG PO CAPS
ORAL_CAPSULE | ORAL | 0 refills | Status: AC
  Filled 2021-08-16: qty 21, 7d supply, fill #0

## 2021-08-16 MED ORDER — MUPIROCIN 2 % EX OINT
TOPICAL_OINTMENT | CUTANEOUS | 0 refills | Status: DC
  Filled 2021-08-16: qty 22, 10d supply, fill #0

## 2021-08-16 MED ORDER — GABAPENTIN 300 MG PO CAPS
ORAL_CAPSULE | ORAL | 0 refills | Status: DC
  Filled 2021-08-16: qty 30, 10d supply, fill #0

## 2021-08-20 NOTE — Progress Notes (Signed)
Remote pacemaker transmission.   

## 2021-09-09 ENCOUNTER — Other Ambulatory Visit (HOSPITAL_BASED_OUTPATIENT_CLINIC_OR_DEPARTMENT_OTHER): Payer: Self-pay

## 2021-09-09 MED ORDER — HYDROCODONE-ACETAMINOPHEN 7.5-325 MG PO TABS
ORAL_TABLET | ORAL | 0 refills | Status: AC
  Filled 2021-09-09: qty 90, 30d supply, fill #0

## 2021-09-13 ENCOUNTER — Other Ambulatory Visit (HOSPITAL_BASED_OUTPATIENT_CLINIC_OR_DEPARTMENT_OTHER): Payer: Self-pay

## 2021-09-14 ENCOUNTER — Other Ambulatory Visit (HOSPITAL_COMMUNITY): Payer: Self-pay

## 2021-09-14 ENCOUNTER — Other Ambulatory Visit (HOSPITAL_COMMUNITY): Payer: Self-pay | Admitting: Cardiology

## 2021-09-14 MED ORDER — FUROSEMIDE 40 MG PO TABS
40.0000 mg | ORAL_TABLET | ORAL | 2 refills | Status: AC
Start: 2021-09-14 — End: ?
  Filled 2021-09-14: qty 115, 58d supply, fill #0

## 2021-09-16 ENCOUNTER — Other Ambulatory Visit (HOSPITAL_COMMUNITY): Payer: Self-pay

## 2021-09-27 ENCOUNTER — Other Ambulatory Visit (HOSPITAL_BASED_OUTPATIENT_CLINIC_OR_DEPARTMENT_OTHER): Payer: Self-pay

## 2021-09-27 MED ORDER — NYSTATIN 100000 UNIT/ML MT SUSP
OROMUCOSAL | 1 refills | Status: AC
  Filled 2021-09-27: qty 280, 14d supply, fill #0
  Filled 2021-10-11: qty 280, 14d supply, fill #1

## 2021-10-11 ENCOUNTER — Other Ambulatory Visit (HOSPITAL_BASED_OUTPATIENT_CLINIC_OR_DEPARTMENT_OTHER): Payer: Self-pay

## 2021-10-12 ENCOUNTER — Other Ambulatory Visit (HOSPITAL_BASED_OUTPATIENT_CLINIC_OR_DEPARTMENT_OTHER): Payer: Self-pay

## 2021-10-12 MED ORDER — HYDROCODONE-ACETAMINOPHEN 7.5-325 MG PO TABS
ORAL_TABLET | ORAL | 0 refills | Status: DC
  Filled 2021-10-12: qty 90, 30d supply, fill #0

## 2021-10-14 ENCOUNTER — Other Ambulatory Visit (HOSPITAL_BASED_OUTPATIENT_CLINIC_OR_DEPARTMENT_OTHER): Payer: Self-pay

## 2021-10-14 ENCOUNTER — Other Ambulatory Visit (HOSPITAL_COMMUNITY): Payer: Self-pay

## 2021-10-18 ENCOUNTER — Other Ambulatory Visit (HOSPITAL_BASED_OUTPATIENT_CLINIC_OR_DEPARTMENT_OTHER): Payer: Self-pay

## 2021-10-20 ENCOUNTER — Other Ambulatory Visit (HOSPITAL_BASED_OUTPATIENT_CLINIC_OR_DEPARTMENT_OTHER): Payer: Self-pay

## 2021-10-29 ENCOUNTER — Other Ambulatory Visit (HOSPITAL_BASED_OUTPATIENT_CLINIC_OR_DEPARTMENT_OTHER): Payer: Self-pay

## 2021-11-01 ENCOUNTER — Other Ambulatory Visit (HOSPITAL_BASED_OUTPATIENT_CLINIC_OR_DEPARTMENT_OTHER): Payer: Self-pay

## 2021-11-02 ENCOUNTER — Ambulatory Visit (INDEPENDENT_AMBULATORY_CARE_PROVIDER_SITE_OTHER): Payer: Medicare HMO

## 2021-11-02 DIAGNOSIS — I442 Atrioventricular block, complete: Secondary | ICD-10-CM

## 2021-11-03 ENCOUNTER — Telehealth: Payer: Self-pay

## 2021-11-03 ENCOUNTER — Other Ambulatory Visit (HOSPITAL_BASED_OUTPATIENT_CLINIC_OR_DEPARTMENT_OTHER): Payer: Self-pay

## 2021-11-03 LAB — CUP PACEART REMOTE DEVICE CHECK
Battery Remaining Longevity: 43 mo
Battery Voltage: 2.97 V
Brady Statistic AP VP Percent: 5.66 %
Brady Statistic AP VS Percent: 0.35 %
Brady Statistic AS VP Percent: 82.39 %
Brady Statistic AS VS Percent: 11.6 %
Brady Statistic RA Percent Paced: 5.87 %
Brady Statistic RV Percent Paced: 86.08 %
Date Time Interrogation Session: 20230712175449
Implantable Lead Implant Date: 20171024
Implantable Lead Implant Date: 20171024
Implantable Lead Location: 753859
Implantable Lead Location: 753860
Implantable Lead Model: 5076
Implantable Lead Model: 5076
Implantable Pulse Generator Implant Date: 20171024
Lead Channel Impedance Value: 380 Ohm
Lead Channel Impedance Value: 399 Ohm
Lead Channel Impedance Value: 399 Ohm
Lead Channel Impedance Value: 437 Ohm
Lead Channel Pacing Threshold Amplitude: 0.375 V
Lead Channel Pacing Threshold Amplitude: 0.875 V
Lead Channel Pacing Threshold Pulse Width: 0.4 ms
Lead Channel Pacing Threshold Pulse Width: 0.4 ms
Lead Channel Sensing Intrinsic Amplitude: 1.25 mV
Lead Channel Sensing Intrinsic Amplitude: 1.25 mV
Lead Channel Sensing Intrinsic Amplitude: 16.375 mV
Lead Channel Sensing Intrinsic Amplitude: 16.375 mV
Lead Channel Setting Pacing Amplitude: 2 V
Lead Channel Setting Pacing Amplitude: 2.5 V
Lead Channel Setting Pacing Pulse Width: 0.4 ms
Lead Channel Setting Sensing Sensitivity: 2 mV

## 2021-11-03 MED ORDER — DICLOFENAC SODIUM 1 % EX GEL
CUTANEOUS | 0 refills | Status: DC
  Filled 2021-11-03: qty 100, 25d supply, fill #0

## 2021-11-03 NOTE — Telephone Encounter (Signed)
Transmission reviewed. Normal device function. Presenting rhythm As/VP. Patient did have multiple tachy episodes 09/24/21 which appears ST with PPM wenckebach as atrial rate exceeded max track rate of 130. No episodes to explain foot pain or new onset of fatigue. Per DPR detailed message left on home phone encouraging patient to contact PCP for other causes of fatigue. Device clinic contact left for additional questions or concerns that may arise.

## 2021-11-03 NOTE — Telephone Encounter (Signed)
11/03/2021 Transmission received.

## 2021-11-03 NOTE — Telephone Encounter (Signed)
The patient states she has been extremely exhausted. She can barely walk. She states because for one she is tired and for two she needs to find out what is wrong with her foot. I asked her to send the transmission for the nurse to review.

## 2021-11-08 ENCOUNTER — Emergency Department (HOSPITAL_BASED_OUTPATIENT_CLINIC_OR_DEPARTMENT_OTHER): Payer: Medicare HMO

## 2021-11-08 ENCOUNTER — Other Ambulatory Visit (HOSPITAL_BASED_OUTPATIENT_CLINIC_OR_DEPARTMENT_OTHER): Payer: Self-pay

## 2021-11-08 ENCOUNTER — Emergency Department (HOSPITAL_BASED_OUTPATIENT_CLINIC_OR_DEPARTMENT_OTHER)
Admission: EM | Admit: 2021-11-08 | Discharge: 2021-11-08 | Disposition: A | Discharge status: Home / Self Care | Payer: Medicare HMO | Attending: Emergency Medicine | Admitting: Emergency Medicine

## 2021-11-08 DIAGNOSIS — R55 Syncope and collapse: Secondary | ICD-10-CM | POA: Diagnosis not present

## 2021-11-08 DIAGNOSIS — R197 Diarrhea, unspecified: Secondary | ICD-10-CM | POA: Diagnosis not present

## 2021-11-08 DIAGNOSIS — Z7982 Long term (current) use of aspirin: Secondary | ICD-10-CM | POA: Insufficient documentation

## 2021-11-08 DIAGNOSIS — I509 Heart failure, unspecified: Secondary | ICD-10-CM | POA: Diagnosis not present

## 2021-11-08 DIAGNOSIS — R112 Nausea with vomiting, unspecified: Secondary | ICD-10-CM | POA: Diagnosis present

## 2021-11-08 DIAGNOSIS — E876 Hypokalemia: Secondary | ICD-10-CM | POA: Diagnosis not present

## 2021-11-08 LAB — COMPREHENSIVE METABOLIC PANEL
ALT: 17 U/L (ref 0–44)
AST: 18 U/L (ref 15–41)
Albumin: 3.6 g/dL (ref 3.5–5.0)
Alkaline Phosphatase: 57 U/L (ref 38–126)
Anion gap: 8 (ref 5–15)
BUN: 5 mg/dL — ABNORMAL LOW (ref 8–23)
CO2: 29 mmol/L (ref 22–32)
Calcium: 9.4 mg/dL (ref 8.9–10.3)
Chloride: 101 mmol/L (ref 98–111)
Creatinine, Ser: 1.04 mg/dL — ABNORMAL HIGH (ref 0.44–1.00)
GFR, Estimated: 59 mL/min — ABNORMAL LOW (ref >60)
Glucose, Bld: 111 mg/dL — ABNORMAL HIGH (ref 70–99)
Potassium: 3 mmol/L — ABNORMAL LOW (ref 3.5–5.1)
Sodium: 138 mmol/L (ref 135–145)
Total Bilirubin: 0.8 mg/dL (ref 0.3–1.2)
Total Protein: 7.8 g/dL (ref 6.5–8.1)

## 2021-11-08 LAB — CBC WITH DIFFERENTIAL/PLATELET
Abs Immature Granulocytes: 0.05 10*3/uL (ref 0.00–0.07)
Basophils Absolute: 0.1 10*3/uL (ref 0.0–0.1)
Basophils Relative: 1 %
Eosinophils Absolute: 0.2 10*3/uL (ref 0.0–0.5)
Eosinophils Relative: 2 %
HCT: 38.4 % (ref 36.0–46.0)
Hemoglobin: 12.6 g/dL (ref 12.0–15.0)
Immature Granulocytes: 1 %
Lymphocytes Relative: 22 %
Lymphs Abs: 2.3 10*3/uL (ref 0.7–4.0)
MCH: 32.6 pg (ref 26.0–34.0)
MCHC: 32.8 g/dL (ref 30.0–36.0)
MCV: 99.2 fL (ref 80.0–100.0)
Monocytes Absolute: 1.1 10*3/uL — ABNORMAL HIGH (ref 0.1–1.0)
Monocytes Relative: 10 %
Neutro Abs: 7.1 10*3/uL (ref 1.7–7.7)
Neutrophils Relative %: 64 %
Platelets: 250 10*3/uL (ref 150–400)
RBC: 3.87 MIL/uL (ref 3.87–5.11)
RDW: 13.8 % (ref 11.5–15.5)
WBC: 10.8 10*3/uL — ABNORMAL HIGH (ref 4.0–10.5)
nRBC: 0 % (ref 0.0–0.2)

## 2021-11-08 LAB — LIPASE, BLOOD: Lipase: 25 U/L (ref 11–51)

## 2021-11-08 MED ORDER — PROCHLORPERAZINE EDISYLATE 10 MG/2ML IJ SOLN
10.0000 mg | Freq: Once | INTRAMUSCULAR | Status: AC
Start: 2021-11-08 — End: 2021-11-08
  Administered 2021-11-08: 10 mg via INTRAVENOUS
  Filled 2021-11-08: qty 2

## 2021-11-08 MED ORDER — PROMETHAZINE HCL 25 MG PO TABS
25.0000 mg | ORAL_TABLET | Freq: Four times a day (QID) | ORAL | 0 refills | Status: AC | PRN
  Filled 2021-11-08: qty 30, 8d supply, fill #0

## 2021-11-08 MED ORDER — DIPHENHYDRAMINE HCL 50 MG/ML IJ SOLN
25.0000 mg | Freq: Once | INTRAMUSCULAR | Status: AC
  Administered 2021-11-08: 25 mg via INTRAVENOUS
  Filled 2021-11-08: qty 1

## 2021-11-08 MED ORDER — SODIUM CHLORIDE 0.9 % IV BOLUS
1000.0000 mL | Freq: Once | INTRAVENOUS | Status: AC
  Administered 2021-11-08: 1000 mL via INTRAVENOUS

## 2021-11-08 NOTE — ED Provider Notes (Signed)
[facility] EMERGENCY DEPARTMENT Provider Note   CSN: [id number] Arrival date & time: 11/08/21  9518     History  Chief Complaint  Patient presents with   Loss of Consciousness   Emesis    Stacy Mcintosh is a 66 y.o. female.  66 yo F with a chief complaints of vomiting and diarrhea for a couple weeks.  Says that she went to the beach and since then has been ill.  Tells me that she has been having issues for at least a couple years where she has been fatigued and having some vomiting off and on.  She usually thinks it is due to overexertion.  She had had a long walk prior to the onset of the symptoms.  She came into the emergency department this morning because 2 times last night she went and sat on the toilet and then woke up on the ground.  She thinks she was out for less than a minute and quickly back to baseline.  Denied any postictal symptoms.  She struck the right side of her face is complaining mostly of right facial pain.  Has been feeling a bit dizzy especially with getting up and moving around.   Loss of Consciousness Associated symptoms: vomiting   Emesis      Home Medications Prior to Admission medications   Medication Sig Start Date End Date Taking? Authorizing Provider  promethazine (PHENERGAN) 25 MG tablet Take 1 tablet (25 mg total) by mouth every 6 (six) hours as needed for nausea or vomiting. 11/08/21  Yes Melene Plan, DO  aspirin EC 81 MG tablet Take 81 mg by mouth daily.     [provider]  carvedilol (COREG) 6.25 MG tablet Take 1 tablet (6.25 mg total) by mouth 2 (two) times daily with a meal. 11/11/20   Laurey Morale, MD  cephALEXin (KEFLEX) 500 MG capsule Take 1 capsule (500 mg total) by mouth 3 times daily for 7 days. 08/16/21     diclofenac Sodium (VOLTAREN) 1 % GEL Apply 2 grams to ankle twice a day as needed for pain 11/03/21     doxepin (SINEQUAN) 50 MG capsule Take 50 mg by mouth at bedtime.    [provider]  ezetimibe (ZETIA)  10 MG tablet Take 1 tablet (10 mg total) by mouth daily. 06/07/21   Laurey Morale, MD  furosemide (LASIX) 40 MG tablet Take 1 tablet (40 mg total) by mouth daily. May take an additional 40mg  daily for fluid 09/14/21   09/16/21, MD  gabapentin (NEURONTIN) 100 MG capsule Take 1 capsule by mouth 3 times a day for 7 days 08/04/21   10/04/21, PA-C  gabapentin (NEURONTIN) 300 MG capsule Take 1 capsule by mouth 3 times a day for 10 days 08/16/21   08/18/21, PA-C  HYDROcodone-acetaminophen (NORCO) 7.5-325 MG tablet Take 1 tablet by mouth 3 times daily as needed for 30 days 09/09/21     HYDROcodone-acetaminophen (NORCO) 7.5-325 MG tablet Take 1 tablet by mouth three times a day as needed 10/12/21     lamoTRIgine (LAMICTAL) 25 MG tablet Take 50 mg by mouth daily.    [provider]  Misc Natural Products (LUNG TONIC PO) Take 1 tablet by mouth 2 (two) times daily.    [provider]  mupirocin ointment (BACTROBAN) 2 % Apply 2-3 times daily to the open sores 08/16/21     nystatin (MYCOSTATIN) 100000 UNIT/ML suspension Take 5 mLs (500,000 Units total) by mouth  4 times daily for 14 days. Swish around mouth, retain as long as possible, then swallow for 7 to 14 days 09/27/21     pantoprazole (PROTONIX) 40 MG tablet Take 1 tablet (40 mg total) by mouth daily. 05/30/17   Harrison Mons, PA  sacubitril-valsartan (ENTRESTO) 97-103 MG Take 1 tablet by mouth 2 (two) times daily. 06/04/21   Larey Dresser, MD  thyroid [facility] THYROID) 60 MG tablet TAKE 1 TABLET (60 MG TOTAL) BY MOUTH DAILY BEFORE BREAKFAST. 04/14/17   Harrison Mons, PA  valACYclovir (VALTREX) 1000 MG tablet Take 1 tablet (1,000 mg total) by mouth 3 times daily for 7 days. 08/04/21     Vitamin D, Ergocalciferol, (DRISDOL) 1.25 MG (50000 UNIT) CAPS capsule Take 50,000 Units by mouth every 7 (seven) days.    [provider]      Allergies    Budesonide-formoterol fumarate, Augmentin [amoxicillin-pot  clavulanate], Lisinopril, Prednisone, and Azithromycin    Review of Systems   Review of Systems  Cardiovascular:  Positive for syncope.  Gastrointestinal:  Positive for vomiting.    Physical Exam Updated Vital Signs BP (!) 161/74 (BP Location: Right Arm)   Pulse 86   Temp 98.3 F (36.8 C) (Oral)   Resp 18   Ht 4\' 11"  (1.499 m)   Wt 78 kg   SpO2 99%   BMI 34.74 kg/m  Physical Exam Vitals and nursing note reviewed.  Constitutional:      General: She is not in acute distress.    Appearance: She is well-developed. She is not diaphoretic.  HENT:     Head: Normocephalic and atraumatic.  Eyes:     Pupils: Pupils are equal, round, and reactive to light.  Cardiovascular:     Rate and Rhythm: Normal rate and regular rhythm.     Heart sounds: No murmur heard.    No friction rub. No gallop.  Pulmonary:     Effort: Pulmonary effort is normal.     Breath sounds: No wheezing or rales.  Abdominal:     General: There is no distension.     Palpations: Abdomen is soft.     Tenderness: There is no abdominal tenderness.  Musculoskeletal:        General: No tenderness.     Cervical back: Normal range of motion and neck supple.  Skin:    General: Skin is warm and dry.  Neurological:     Mental Status: She is alert and oriented to person, place, and time.     GCS: GCS eye subscore is 4. GCS verbal subscore is 5. GCS motor subscore is 6.     Cranial Nerves: Cranial nerves 2-12 are intact.     Sensory: Sensation is intact.     Motor: Motor function is intact.     Coordination: Coordination is intact.     Comments: With the limitation of the patient being in a boot to the right lower extremity and not putting forth much effort was of benign neurologic exam  Psychiatric:        Behavior: Behavior normal.     ED Results / Procedures / Treatments   Labs (all labs ordered are listed, but only abnormal results are displayed) Labs Reviewed  CBC WITH DIFFERENTIAL/PLATELET - Abnormal;  Notable for the following components:      Result Value   WBC 10.8 (*)    Monocytes Absolute 1.1 (*)    All other components within normal limits  COMPREHENSIVE METABOLIC PANEL - Abnormal; Notable  for the following components:   Potassium 3.0 (*)    Glucose, Bld 111 (*)    BUN 5 (*)    Creatinine, Ser 1.04 (*)    GFR, Estimated 59 (*)    All other components within normal limits  LIPASE, BLOOD    EKG None  Radiology CT Head Wo Contrast  Result Date: 11/08/2021 CLINICAL DATA:  Provided history: Head trauma, minor. Facial trauma, blunt. Neck trauma. Additional history provided: 2 syncopal episodes. EXAM: CT HEAD WITHOUT CONTRAST CT MAXILLOFACIAL WITHOUT CONTRAST CT CERVICAL SPINE WITHOUT CONTRAST TECHNIQUE: Multidetector CT imaging of the head, cervical spine, and maxillofacial structures were performed using the standard protocol without intravenous contrast. Multiplanar CT image reconstructions of the cervical spine and maxillofacial structures were also generated. RADIATION DOSE REDUCTION: This exam was performed according to the departmental dose-optimization program which includes automated exposure control, adjustment of the mA and/or kV according to patient size and/or use of iterative reconstruction technique. COMPARISON:  Cervical spine MRI 02/12/2015. FINDINGS: CT HEAD FINDINGS Brain: Mild generalized cerebral atrophy. There is no acute intracranial hemorrhage. No demarcated cortical infarct. No extra-axial fluid collection. No evidence of an intracranial mass. No midline shift. Vascular: No hyperdense vessel. Atherosclerotic calcifications. Skull: No fracture or aggressive osseous lesion. CT MAXILLOFACIAL FINDINGS Osseous: No acute maxillofacial fracture is identified. Orbits: No acute finding within the orbits. Sinuses: Scattered mucosal thickening and fluid within the bilateral ethmoid sinuses. Trace mucosal thickening within the bilateral sphenoid sinuses. Mild mucosal thickening  within the bilateral maxillary sinuses. Soft tissues: No appreciable maxillofacial soft tissue swelling. CT CERVICAL SPINE FINDINGS Alignment: Reversal of the expected cervical lordosis. 2 mm C2-C3 grade 1 anterolisthesis. Skull base and vertebrae: The basion-dental and atlanto-dental intervals are maintained.No evidence of acute fracture to the cervical spine. Soft tissues and spinal canal: No prevertebral fluid or swelling. No visible canal hematoma. Disc levels: Cervical spondylosis. No more than mild disc space narrowing. Multilevel disc bulges/central disc protrusions and uncovertebral hypertrophy. No appreciable high-grade spinal canal stenosis. Bony neural foraminal narrowing on the left at C4-C5. Upper chest: No consolidation within the imaged lung apices. No visible pneumothorax. IMPRESSION: CT head: 1. No evidence of acute intracranial abnormality. 2. Mild generalized cerebral atrophy. CT maxillofacial: No evidence of acute maxillofacial fracture. CT cervical spine: 1. No evidence of acute fracture to the cervical spine. 2. Nonspecific reversal of the expected cervical lordosis. 3. 2 mm C2-C3 grade 1 anterolisthesis. 4. Cervical spondylosis, as described. Electronically Signed   By: Kellie Simmering D.O.   On: 11/08/2021 09:19   CT Maxillofacial Wo Contrast  Result Date: 11/08/2021 CLINICAL DATA:  Provided history: Head trauma, minor. Facial trauma, blunt. Neck trauma. Additional history provided: 2 syncopal episodes. EXAM: CT HEAD WITHOUT CONTRAST CT MAXILLOFACIAL WITHOUT CONTRAST CT CERVICAL SPINE WITHOUT CONTRAST TECHNIQUE: Multidetector CT imaging of the head, cervical spine, and maxillofacial structures were performed using the standard protocol without intravenous contrast. Multiplanar CT image reconstructions of the cervical spine and maxillofacial structures were also generated. RADIATION DOSE REDUCTION: This exam was performed according to the departmental dose-optimization program which includes  automated exposure control, adjustment of the mA and/or kV according to patient size and/or use of iterative reconstruction technique. COMPARISON:  Cervical spine MRI 02/12/2015. FINDINGS: CT HEAD FINDINGS Brain: Mild generalized cerebral atrophy. There is no acute intracranial hemorrhage. No demarcated cortical infarct. No extra-axial fluid collection. No evidence of an intracranial mass. No midline shift. Vascular: No hyperdense vessel. Atherosclerotic calcifications. Skull: No fracture or aggressive osseous lesion. CT  MAXILLOFACIAL FINDINGS Osseous: No acute maxillofacial fracture is identified. Orbits: No acute finding within the orbits. Sinuses: Scattered mucosal thickening and fluid within the bilateral ethmoid sinuses. Trace mucosal thickening within the bilateral sphenoid sinuses. Mild mucosal thickening within the bilateral maxillary sinuses. Soft tissues: No appreciable maxillofacial soft tissue swelling. CT CERVICAL SPINE FINDINGS Alignment: Reversal of the expected cervical lordosis. 2 mm C2-C3 grade 1 anterolisthesis. Skull base and vertebrae: The basion-dental and atlanto-dental intervals are maintained.No evidence of acute fracture to the cervical spine. Soft tissues and spinal canal: No prevertebral fluid or swelling. No visible canal hematoma. Disc levels: Cervical spondylosis. No more than mild disc space narrowing. Multilevel disc bulges/central disc protrusions and uncovertebral hypertrophy. No appreciable high-grade spinal canal stenosis. Bony neural foraminal narrowing on the left at C4-C5. Upper chest: No consolidation within the imaged lung apices. No visible pneumothorax. IMPRESSION: CT head: 1. No evidence of acute intracranial abnormality. 2. Mild generalized cerebral atrophy. CT maxillofacial: No evidence of acute maxillofacial fracture. CT cervical spine: 1. No evidence of acute fracture to the cervical spine. 2. Nonspecific reversal of the expected cervical lordosis. 3. 2 mm C2-C3 grade  1 anterolisthesis. 4. Cervical spondylosis, as described. Electronically Signed   By: Kellie Simmering D.O.   On: 11/08/2021 09:19   CT Cervical Spine Wo Contrast  Result Date: 11/08/2021 CLINICAL DATA:  Provided history: Head trauma, minor. Facial trauma, blunt. Neck trauma. Additional history provided: 2 syncopal episodes. EXAM: CT HEAD WITHOUT CONTRAST CT MAXILLOFACIAL WITHOUT CONTRAST CT CERVICAL SPINE WITHOUT CONTRAST TECHNIQUE: Multidetector CT imaging of the head, cervical spine, and maxillofacial structures were performed using the standard protocol without intravenous contrast. Multiplanar CT image reconstructions of the cervical spine and maxillofacial structures were also generated. RADIATION DOSE REDUCTION: This exam was performed according to the departmental dose-optimization program which includes automated exposure control, adjustment of the mA and/or kV according to patient size and/or use of iterative reconstruction technique. COMPARISON:  Cervical spine MRI 02/12/2015. FINDINGS: CT HEAD FINDINGS Brain: Mild generalized cerebral atrophy. There is no acute intracranial hemorrhage. No demarcated cortical infarct. No extra-axial fluid collection. No evidence of an intracranial mass. No midline shift. Vascular: No hyperdense vessel. Atherosclerotic calcifications. Skull: No fracture or aggressive osseous lesion. CT MAXILLOFACIAL FINDINGS Osseous: No acute maxillofacial fracture is identified. Orbits: No acute finding within the orbits. Sinuses: Scattered mucosal thickening and fluid within the bilateral ethmoid sinuses. Trace mucosal thickening within the bilateral sphenoid sinuses. Mild mucosal thickening within the bilateral maxillary sinuses. Soft tissues: No appreciable maxillofacial soft tissue swelling. CT CERVICAL SPINE FINDINGS Alignment: Reversal of the expected cervical lordosis. 2 mm C2-C3 grade 1 anterolisthesis. Skull base and vertebrae: The basion-dental and atlanto-dental intervals are  maintained.No evidence of acute fracture to the cervical spine. Soft tissues and spinal canal: No prevertebral fluid or swelling. No visible canal hematoma. Disc levels: Cervical spondylosis. No more than mild disc space narrowing. Multilevel disc bulges/central disc protrusions and uncovertebral hypertrophy. No appreciable high-grade spinal canal stenosis. Bony neural foraminal narrowing on the left at C4-C5. Upper chest: No consolidation within the imaged lung apices. No visible pneumothorax. IMPRESSION: CT head: 1. No evidence of acute intracranial abnormality. 2. Mild generalized cerebral atrophy. CT maxillofacial: No evidence of acute maxillofacial fracture. CT cervical spine: 1. No evidence of acute fracture to the cervical spine. 2. Nonspecific reversal of the expected cervical lordosis. 3. 2 mm C2-C3 grade 1 anterolisthesis. 4. Cervical spondylosis, as described. Electronically Signed   By: Kellie Simmering D.O.   On: 11/08/2021  09:19    Procedures Procedures    Medications Ordered in ED Medications  sodium chloride 0.9 % bolus 1,000 mL (0 mLs Intravenous Stopped 11/08/21 1004)  prochlorperazine (COMPAZINE) injection 10 mg (10 mg Intravenous Given 11/08/21 0908)  diphenhydrAMINE (BENADRYL) injection 25 mg (25 mg Intravenous Given 11/08/21 0907)    ED Course/ Medical Decision Making/ A&P                           Medical Decision Making Amount and/or Complexity of Data Reviewed Labs: ordered. Radiology: ordered.  Risk Prescription drug management.   66 yo F with a chief complaints of 2 events where she lost consciousness.  These both happened while she was sitting on the toilet.  She struck the right side of her face with 1 of these falls and was complaining of some right facial pain.  With headache nausea and vomiting we will obtain a CT scan of the head face and C-spine.  She tells me that she has not had anything really to eat and drink for 2 weeks will check blood work.  Bolus of IV  fluids.  Antiemetics.  Reassess.  CT of the head face and C-spine without acute pathology as independently interpreted by me.  Patient's blood work with some hypokalemia, no significant anemia no other significant electrolyte abnormality.  I discussed results with the patient.  She is feeling a bit better.  Tolerating by mouth.  We will have her follow-up with her family doctor.  Patient did have a couple syncopal events, with a history of heart failure typically this would indicate to admission, however patient's last 2 echocardiograms with a relatively normal EF on my record review.  .  I feel her syncope is more likely because due to hypovolemia with intractable nausea vomiting and decreased oral intake.   10:37 AM:  I have discussed the diagnosis/risks/treatment options with the patient and family.  Evaluation and diagnostic testing in the emergency department does not suggest an emergent condition requiring admission or immediate intervention beyond what has been performed at this time.  They will follow up with  PCP. We also discussed returning to the ED immediately if new or worsening sx occur. We discussed the sx which are most concerning (e.g., sudden worsening pain, fever, inability to tolerate by mouth) that necessitate immediate return. Medications administered to the patient during their visit and any new prescriptions provided to the patient are listed below.  Medications given during this visit Medications  sodium chloride 0.9 % bolus 1,000 mL (0 mLs Intravenous Stopped 11/08/21 1004)  prochlorperazine (COMPAZINE) injection 10 mg (10 mg Intravenous Given 11/08/21 0908)  diphenhydrAMINE (BENADRYL) injection 25 mg (25 mg Intravenous Given 11/08/21 8527)     The patient appears reasonably screen and/or stabilized for discharge and I doubt any other medical condition or other [facility] requiring further screening, evaluation, or treatment in the ED at this time prior to discharge.          Final Clinical Impression(s) / ED Diagnoses Final diagnoses:  Nausea and vomiting in adult    Rx / DC Orders ED Discharge Orders          Ordered    promethazine (PHENERGAN) 25 MG tablet  Every 6 hours PRN        11/08/21 0945              Melene Plan, DO 11/08/21 1037

## 2021-11-08 NOTE — ED Triage Notes (Signed)
Had 2 syncopal episodes off the toilet last night. States has been vomiting/diarrhea x 2 weeks. Dr. Adela Lank at bedside during triage.

## 2021-11-08 NOTE — Discharge Instructions (Signed)
Your blood work here did not show an obvious cause of your fatigue.  Your CT imaging did not show any acute findings in your head neck or face.  Please follow-up with your family doctor for your ongoing symptoms.  I prescribed you medicine to try and help you with your nausea.  Your potassium level was low here.  I have provided you a list of foods that are high in potassium.  I try to have you pick 1 that you like with each meal to have for the next week and then have your family doctor hopefully recheck your lab in a week or 2.

## 2021-11-08 NOTE — ED Notes (Signed)
Patient transported to CT 

## 2021-11-11 ENCOUNTER — Other Ambulatory Visit (HOSPITAL_BASED_OUTPATIENT_CLINIC_OR_DEPARTMENT_OTHER): Payer: Self-pay

## 2021-11-11 MED ORDER — HYDROCODONE-ACETAMINOPHEN 7.5-325 MG PO TABS
ORAL_TABLET | ORAL | 0 refills | Status: AC
  Filled 2021-11-11: qty 90, 30d supply, fill #0

## 2021-11-11 MED ORDER — GABAPENTIN 300 MG PO CAPS
ORAL_CAPSULE | ORAL | 3 refills | Status: AC
  Filled 2021-11-11: qty 90, 39d supply, fill #0

## 2021-11-12 ENCOUNTER — Other Ambulatory Visit (HOSPITAL_BASED_OUTPATIENT_CLINIC_OR_DEPARTMENT_OTHER): Payer: Self-pay

## 2021-11-12 ENCOUNTER — Other Ambulatory Visit (HOSPITAL_COMMUNITY): Payer: Self-pay

## 2021-11-12 MED ORDER — PANTOPRAZOLE SODIUM 40 MG PO TBEC
40.0000 mg | DELAYED_RELEASE_TABLET | Freq: Every day | ORAL | 1 refills | Status: DC
  Filled 2021-11-12: qty 90, 90d supply, fill #0

## 2021-11-15 ENCOUNTER — Other Ambulatory Visit (HOSPITAL_BASED_OUTPATIENT_CLINIC_OR_DEPARTMENT_OTHER): Payer: Self-pay

## 2021-11-15 MED ORDER — THYROID 60 MG PO TABS
ORAL_TABLET | ORAL | 2 refills | Status: DC
Start: 2021-11-15 — End: 2021-12-07
  Filled 2021-11-15: qty 90, 60d supply, fill #0

## 2021-11-15 MED ORDER — POTASSIUM CHLORIDE CRYS ER 20 MEQ PO TBCR
EXTENDED_RELEASE_TABLET | ORAL | 0 refills | Status: AC
Start: 2021-11-15 — End: ?
  Filled 2021-11-15: qty 5, 5d supply, fill #0

## 2021-11-16 ENCOUNTER — Emergency Department (HOSPITAL_COMMUNITY): Payer: Medicare HMO

## 2021-11-16 ENCOUNTER — Emergency Department (HOSPITAL_COMMUNITY)
Admission: EM | Admit: 2021-11-16 | Discharge: 2021-11-23 | Disposition: E | Payer: Medicare HMO | Attending: Emergency Medicine | Admitting: Emergency Medicine

## 2021-11-16 DIAGNOSIS — I469 Cardiac arrest, cause unspecified: Secondary | ICD-10-CM | POA: Insufficient documentation

## 2021-11-16 DIAGNOSIS — Z7982 Long term (current) use of aspirin: Secondary | ICD-10-CM | POA: Insufficient documentation

## 2021-11-16 LAB — I-STAT CHEM 8, ED
BUN: 13 mg/dL (ref 8–23)
Calcium, Ion: 1.07 mmol/L — ABNORMAL LOW (ref 1.15–1.40)
Chloride: 112 mmol/L — ABNORMAL HIGH (ref 98–111)
Creatinine, Ser: 1.3 mg/dL — ABNORMAL HIGH (ref 0.44–1.00)
Glucose, Bld: 296 mg/dL — ABNORMAL HIGH (ref 70–99)
HCT: 40 % (ref 36.0–46.0)
Hemoglobin: 13.6 g/dL (ref 12.0–15.0)
Potassium: 5.1 mmol/L (ref 3.5–5.1)
Sodium: 143 mmol/L (ref 135–145)
TCO2: 16 mmol/L — ABNORMAL LOW (ref 22–32)

## 2021-11-16 MED ORDER — NOREPINEPHRINE 4 MG/250ML-% IV SOLN
0.0000 ug/min | INTRAVENOUS | Status: DC
  Administered 2021-11-16: 20 ug/min via INTRAVENOUS

## 2021-11-16 MED ORDER — SODIUM CHLORIDE 0.9 % IV SOLN
INTRAVENOUS | Status: DC | PRN
  Administered 2021-11-16: 1000 mL via INTRAVENOUS

## 2021-11-16 MED ORDER — EPINEPHRINE 1 MG/10ML IJ SOSY
PREFILLED_SYRINGE | INTRAMUSCULAR | Status: DC | PRN
  Administered 2021-11-16 (×2): 1 mg via INTRAVENOUS
  Administered 2021-11-16: .5 mg via INTRAVENOUS
  Administered 2021-11-16 (×3): 1 mg via INTRAVENOUS

## 2021-11-18 ENCOUNTER — Other Ambulatory Visit (HOSPITAL_BASED_OUTPATIENT_CLINIC_OR_DEPARTMENT_OTHER): Payer: Self-pay

## 2021-11-23 NOTE — ED Provider Notes (Signed)
[facility] DEPART[facility] Provider Note   CSN: [id number] Arrival date & time: 11-Dec-2021  0124     History  No chief complaint on file.   Stacy Mcintosh is a 66 y.o. female.  Patient was brought to the emergency department from home by EMS.  EMS reports that they were initially called out for difficulty breathing.  Upon their arrival, patient was reportedly stating that she was extremely uncomfortable and that she could not breathe.  She was given aspirin, albuterol.  Patient then became unresponsive.  EMS was concerned about rightward gaze with flexion of the right upper extremity, called a code stroke.  EMS reports that just upon arrival at the hospital, they lost pulses and initiated CPR.       Home Medications Prior to Admission medications   Medication Sig Start Date End Date Taking? Authorizing Provider  aspirin EC 81 MG tablet Take 81 mg by mouth daily.     [provider]  carvedilol (COREG) 6.25 MG tablet Take 1 tablet (6.25 mg total) by mouth 2 (two) times daily with a meal. 11/11/20   Larey Dresser, MD  cephALEXin (KEFLEX) 500 MG capsule Take 1 capsule (500 mg total) by mouth 3 times daily for 7 days. 08/16/21     diclofenac Sodium (VOLTAREN) 1 % GEL Apply 2 grams to ankle twice a day as needed for pain 11/03/21     doxepin (SINEQUAN) 50 MG capsule Take 50 mg by mouth at bedtime.    [provider]  furosemide (LASIX) 40 MG tablet Take 1 tablet (40 mg total) by mouth daily. May take an additional 40mg  daily for fluid 09/14/21   Larey Dresser, MD  gabapentin (NEURONTIN) 100 MG capsule Take 1 capsule by mouth 3 times a day for 7 days 08/04/21   Kelby Aline, PA-C  gabapentin (NEURONTIN) 300 MG capsule Take 1 capsule by mouth every day x 1 week then take 1 capsule twice a day x 1 week then 1 take 1 capsule 3 times a day for 30 days 11/11/21     HYDROcodone-acetaminophen (NORCO) 7.5-325 MG tablet Take 1 tablet by mouth 3 times daily as  needed for 30 days 09/09/21     HYDROcodone-acetaminophen (NORCO) 7.5-325 MG tablet Take 1 tablet by mouth 3 times a day as needed 11/11/21     lamoTRIgine (LAMICTAL) 25 MG tablet Take 50 mg by mouth daily.    [provider]  Misc Natural Products (LUNG TONIC PO) Take 1 tablet by mouth 2 (two) times daily.    [provider]  mupirocin ointment (BACTROBAN) 2 % Apply 2-3 times daily to the open sores 08/16/21     nystatin (MYCOSTATIN) 100000 UNIT/ML suspension Take 5 mLs (500,000 Units total) by mouth 4 times daily for 14 days. Swish around mouth, retain as long as possible, then swallow for 7 to 14 days 09/27/21     pantoprazole (PROTONIX) 40 MG tablet Take 1 tablet (40 mg total) by mouth daily. 05/30/17   Harrison Mons, PA  pantoprazole (PROTONIX) 40 MG tablet Take 1 tablet (40 mg total) by mouth daily. 11/12/21     potassium chloride SA (KLOR-CON M) 20 MEQ tablet Take 1 tablet (20 mEq total) by mouth daily for 5 days. 11/15/21     promethazine (PHENERGAN) 25 MG tablet Take 1 tablet (25 mg total) by mouth every 6 (six) hours as needed for nausea or vomiting. 11/08/21   Deno Etienne, DO  sacubitril-valsartan [facility]) [phone]  MG Take 1 tablet by mouth 2 (two) times daily. 06/04/21   Laurey Morale, MD  thyroid [facility] THYROID) 60 MG tablet TAKE 1 TABLET (60 MG TOTAL) BY MOUTH DAILY BEFORE BREAKFAST. 04/14/17   Porfirio Oar, PA  thyroid (ARMOUR) 60 MG tablet Take 1& 1/2 tablets by mouth daily. Recheck in 3 months 11/15/21     valACYclovir (VALTREX) 1000 MG tablet Take 1 tablet (1,000 mg total) by mouth 3 times daily for 7 days. 08/04/21     Vitamin D, Ergocalciferol, (DRISDOL) 1.25 MG (50000 UNIT) CAPS capsule Take 50,000 Units by mouth every 7 (seven) days.    [provider]  ezetimibe (ZETIA) 10 MG tablet Take 1 tablet (10 mg total) by mouth daily. 06/07/21 11/12/21  Laurey Morale, MD      Allergies    Budesonide-formoterol fumarate, Augmentin [amoxicillin-pot clavulanate],  Doxycycline, Ezetimibe, Lisinopril, Prednisone, and Azithromycin    Review of Systems   Review of Systems  Physical Exam Updated Vital Signs BP (!) 42/28   Pulse (!) 132   Resp 14   Ht 4\' 11"  (1.499 m)   Wt 78 kg   SpO2 (!) 47%   BMI 34.74 kg/m  Physical Exam Vitals and nursing note reviewed. Exam conducted with a chaperone present.  Constitutional:      Appearance: She is ill-appearing.  HENT:     Head: Atraumatic.     Comments: Vomit noted in the airway Eyes:     Comments: Pupils mid, fixed  Cardiovascular:     Comments: No cardiac activity Pulmonary:     Comments: Patient being bagged via facemask, rales and rhonchi present Abdominal:     General: There is distension.  Musculoskeletal:        General: No deformity.  Neurological:     Comments: Unresponsive     ED Results / Procedures / Treatments   Labs (all labs ordered are listed, but only abnormal results are displayed) Labs Reviewed  I-STAT CHEM 8, ED - Abnormal; Notable for the following components:      Result Value   Chloride 112 (*)    Creatinine, Ser 1.30 (*)    Glucose, Bld 296 (*)    Calcium, Ion 1.07 (*)    TCO2 16 (*)    All other components within normal limits    EKG None  Radiology No results found.  Procedures Procedure Name: Intubation Date/Time: 12/09/2021 3:36 AM  Performed by: 11/18/2021, MDPre-anesthesia Checklist: Patient identified, Emergency Drugs available, Suction available, Patient being monitored and Timeout performed Oxygen Delivery Method: Ambu bag Preoxygenation: Pre-oxygenation with 100% oxygen Ventilation: Mask ventilation with difficulty Laryngoscope Size: Glidescope and 4 Grade View: Grade II Tube size: 7.0 mm Number of attempts: 2 Placement Confirmation: CO2 detector, Breath sounds checked- equal and bilateral and ETT inserted through vocal cords under direct vision Secured at: 24 cm Tube secured with: ETT holder Comments: Patient intubated via  glide scope.  Airway visualization was difficult due to large amount of vomitus in the airway.  Breath sounds equal bilaterally, CO2 detector with good color change.  Oxygen saturations maintained at 50%.  A large amount of vomit was noted in the tube.  Patient was therefore reintubated.  Tube visualized going directly through the tubes, no change in oxygenation.  Findings consistent with large aspiration.        Medications Ordered in ED Medications  EPINEPHrine (ADRENALIN) 1 MG/10ML injection (0.5 mg Intravenous Given 12/09/21 0143)  0.9 %  sodium chloride  infusion (1,000 mLs Intravenous New Bag/Given November 17, 2021 0142)  norepinephrine (LEVOPHED) 4mg  in (0.016 mg/mL) premix infusion (20 mcg/min Intravenous New Bag/Given November 17, 2021 0147)    ED Course/ Medical Decision Making/ A&P                           Medical Decision Making Amount and/or Complexity of Data Reviewed Radiology: ordered.  Risk Prescription drug management.   Presents to the emergency department from home.  Record review indicates a history of systolic congestive heart failure secondary to nonischemic cardiomyopathy.  Patient initially in respiratory distress.  EMS then became concerned about possible stroke.  During transport, patient suffered cardiopulmonary arrest.  At arrival CPR was being performed and the patient was being bagged via facemask.  There was noted to be a large amount of vomit in the mouth and airway.  This was suctioned and definitive endotracheal tube was placed.  CPR was continued.  Patient administered intermittent epi boluses.  She would intermittently regain pulses.  This would DKN to PEA as the patient has a pacemaker.  Patient initiated on Levophed and ACLS continued.  Multiple pulse checks were negative.  Patient declared dead at 1:55 AM.  Cardiopulmonary Resuscitation (CPR) Procedure Note Directed/Performed by: 11/18/21 I personally directed ancillary staff and/or performed  CPR in an effort to regain return of spontaneous circulation and to maintain cardiac, neuro and systemic perfusion.   CRITICAL CARE Performed by: Gilda Crease   Total critical care time: 31 minutes  Critical care time was exclusive of separately billable procedures and treating other patients.  Critical care was necessary to treat or prevent imminent or life-threatening deterioration.  Critical care was time spent personally by me on the following activities: development of treatment plan with patient and/or surrogate as well as nursing, discussions with consultants, evaluation of patient's response to treatment, examination of patient, obtaining history from patient or surrogate, ordering and performing treatments and interventions, ordering and review of laboratory studies, ordering and review of radiographic studies, pulse oximetry and re-evaluation of patient's condition.         Final Clinical Impression(s) / ED Diagnoses Final diagnoses:  Cardiopulmonary arrest [facility])    Rx / DC Orders ED Discharge Orders     None         Zavion Sleight, [facility], MD 17-Nov-2021 [phone]

## 2021-11-23 NOTE — ED Triage Notes (Signed)
Pt via [facility] as code stroke/CPR. EMS originally called out for SHOB/ "difficulty breathing x a couple days." Pt "voiced discomfort" throughout transport, after encoding pt was noted to have R sided gaze, R arm contracture. EMS ativacted code stroke, CPR started just prior to arrival to room.  EDP Pollina at bedside 18LAC, 1 epi PTA @ 0126

## 2021-11-23 NOTE — ED Notes (Signed)
[facility] medic came back into room to advise this RN that 324mg  ASA & 1 albuterol tx administered PTA

## 2021-11-23 NOTE — Progress Notes (Signed)
Chaplain responded to call from ED. Pt died in Trauma B.  Chaplain met pt's wife and offered comfort and prayer.   Chaplain provided Patient Placement Card  Pt's husband provided his contact info:  Yoselin Amerman [address] [place], [place]  [address]  [phone]  Chaplain supported patient until his friend Gershon Mussel arrived.  Rev. Tamsen Snider Pager [phone]

## 2021-11-23 DEATH — deceased

## 2021-11-24 NOTE — Addendum Note (Signed)
Addended by: Elease Etienne A on: 11/24/2021 01:17 PM   Modules accepted: Level of Service

## 2021-11-24 NOTE — Progress Notes (Signed)
Remote pacemaker transmission.   

## 2021-11-25 ENCOUNTER — Other Ambulatory Visit (HOSPITAL_BASED_OUTPATIENT_CLINIC_OR_DEPARTMENT_OTHER): Payer: Self-pay

## 2021-12-01 ENCOUNTER — Other Ambulatory Visit (HOSPITAL_BASED_OUTPATIENT_CLINIC_OR_DEPARTMENT_OTHER): Payer: Self-pay

## 2022-03-01 ENCOUNTER — Other Ambulatory Visit (HOSPITAL_BASED_OUTPATIENT_CLINIC_OR_DEPARTMENT_OTHER): Payer: Self-pay
